# Patient Record
Sex: Male | Born: 1945 | Race: White | Hispanic: No | Marital: Married | Smoking: Former smoker
Health system: Southern US, Community
[De-identification: ages and names within clinical notes are randomized; demographics above are authoritative.]

## PROBLEM LIST (undated history)

## (undated) DIAGNOSIS — M109 Gout, unspecified: Secondary | ICD-10-CM

## (undated) DIAGNOSIS — I2510 Atherosclerotic heart disease of native coronary artery without angina pectoris: Secondary | ICD-10-CM

## (undated) DIAGNOSIS — M722 Plantar fascial fibromatosis: Secondary | ICD-10-CM

## (undated) DIAGNOSIS — M549 Dorsalgia, unspecified: Secondary | ICD-10-CM

## (undated) DIAGNOSIS — G709 Myoneural disorder, unspecified: Secondary | ICD-10-CM

## (undated) DIAGNOSIS — E119 Type 2 diabetes mellitus without complications: Secondary | ICD-10-CM

## (undated) DIAGNOSIS — R251 Tremor, unspecified: Secondary | ICD-10-CM

## (undated) DIAGNOSIS — E785 Hyperlipidemia, unspecified: Secondary | ICD-10-CM

## (undated) DIAGNOSIS — K5792 Diverticulitis of intestine, part unspecified, without perforation or abscess without bleeding: Secondary | ICD-10-CM

## (undated) DIAGNOSIS — I214 Non-ST elevation (NSTEMI) myocardial infarction: Secondary | ICD-10-CM

## (undated) DIAGNOSIS — M1990 Unspecified osteoarthritis, unspecified site: Secondary | ICD-10-CM

## (undated) HISTORY — DX: Non-ST elevation (NSTEMI) myocardial infarction: I21.4

## (undated) HISTORY — DX: Atherosclerotic heart disease of native coronary artery without angina pectoris: I25.10

## (undated) HISTORY — PX: TRANSURETHRAL RESECTION OF PROSTATE: SHX73

## (undated) HISTORY — PX: KNEE SURGERY: SHX244

---
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
## 2004-11-15 ENCOUNTER — Inpatient Hospital Stay (HOSPITAL_COMMUNITY): Admission: RE | Admit: 2004-11-15 | Discharge: 2004-11-16 | Payer: Self-pay | Admitting: Urology

## 2005-10-08 ENCOUNTER — Inpatient Hospital Stay (HOSPITAL_COMMUNITY): Admission: AC | Admit: 2005-10-08 | Discharge: 2005-10-09 | Payer: Self-pay

## 2012-01-08 ENCOUNTER — Emergency Department (HOSPITAL_COMMUNITY)
Admission: EM | Admit: 2012-01-08 | Discharge: 2012-01-08 | Disposition: A | Discharge status: Home / Self Care | Payer: 59 | Attending: Emergency Medicine | Admitting: Emergency Medicine

## 2012-01-08 ENCOUNTER — Emergency Department (HOSPITAL_COMMUNITY): Payer: 59

## 2012-01-08 ENCOUNTER — Encounter (HOSPITAL_COMMUNITY): Payer: Self-pay | Admitting: *Deleted

## 2012-01-08 DIAGNOSIS — F17200 Nicotine dependence, unspecified, uncomplicated: Secondary | ICD-10-CM | POA: Insufficient documentation

## 2012-01-08 DIAGNOSIS — Z79899 Other long term (current) drug therapy: Secondary | ICD-10-CM | POA: Insufficient documentation

## 2012-01-08 DIAGNOSIS — Z885 Allergy status to narcotic agent status: Secondary | ICD-10-CM | POA: Insufficient documentation

## 2012-01-08 DIAGNOSIS — M1710 Unilateral primary osteoarthritis, unspecified knee: Secondary | ICD-10-CM | POA: Insufficient documentation

## 2012-01-08 DIAGNOSIS — Y9269 Other specified industrial and construction area as the place of occurrence of the external cause: Secondary | ICD-10-CM | POA: Insufficient documentation

## 2012-01-08 DIAGNOSIS — X500XXA Overexertion from strenuous movement or load, initial encounter: Secondary | ICD-10-CM | POA: Insufficient documentation

## 2012-01-08 DIAGNOSIS — M25562 Pain in left knee: Secondary | ICD-10-CM

## 2012-01-08 DIAGNOSIS — M1990 Unspecified osteoarthritis, unspecified site: Secondary | ICD-10-CM

## 2012-01-08 MED ORDER — HYDROMORPHONE HCL 2 MG PO TABS: 2.0000 mg | ORAL_TABLET | ORAL | Status: AC | PRN

## 2012-01-08 MED ORDER — HYDROMORPHONE HCL PF 2 MG/ML IJ SOLN
2.0000 mg | Freq: Once | INTRAMUSCULAR | Status: AC
  Administered 2012-01-08: 2 mg via INTRAMUSCULAR
  Filled 2012-01-08: qty 1

## 2012-01-08 NOTE — ED Notes (Signed)
Pt reports hx of left knee pain, states while at work he twisted it wrong and now can not walk on it.

## 2012-01-08 NOTE — Progress Notes (Signed)
Orthopedic Tech Progress Note Patient Details:  Adrian [name] [birth year] [id number]  Ortho Devices Type of Ortho Device: Crutches Ortho Device/Splint Interventions: Application   Nikki Dom 01/08/2012, 4:07 PM

## 2012-01-08 NOTE — ED Notes (Signed)
Ortho tech called for crutches 

## 2012-01-08 NOTE — ED Provider Notes (Signed)
History  Scribed for Adrian Co, MD, the patient was seen in room STRE5/STRE5. This chart was scribed by Candelaria Stagers. The patient's care started at 2:56 PM   CSN: [id number]  Arrival date & time 01/08/12  1423   First MD Initiated Contact with Patient 01/08/12 1449      Chief Complaint  Patient presents with  . Knee Pain     The history is provided by the patient.   Adrian [name] is a 66 y.o. male who presents to the Emergency Department complaining of left knee pain after twisting the knee earlier today while at work opening a door.  He was not able to put weight on it after the incident.  Bending is not a problem.  Pt has h/o lateral meniscus repair and chronic pain.  He will have total knee replacement this fall.   History reviewed. No pertinent past medical history.  History reviewed. No pertinent past surgical history.  History reviewed. No pertinent family history.  History  Substance Use Topics  . Smoking status: Current Everyday Smoker  . Smokeless tobacco: Not on file  . Alcohol Use: Yes     occ      Review of Systems  All other systems reviewed and are negative.    Allergies  Codeine and Hydrocodone  Home Medications   Current Outpatient Rx  Name Route Sig Dispense Refill  . ADULT MULTIVITAMIN W/MINERALS CH Oral Take 1 tablet by mouth daily.    [name] HYDROMORPHONE HCL 2 MG PO TABS Oral Take 1 tablet (2 mg total) by mouth every 4 (four) hours as needed for pain. 30 tablet 0    BP 113/75  Pulse 72  Temp 97.7 F (36.5 C) (Oral)  Resp 18  SpO2 96%  Physical Exam  Nursing note and vitals reviewed. Constitutional: He is oriented to person, place, and time. He appears well-developed and well-nourished. No distress.  HENT:  Head: Normocephalic and atraumatic.  Eyes: EOM are normal.  Neck: Neck supple. No tracheal deviation present.  Cardiovascular: Normal rate.   Pulmonary/Chest: Effort normal. No respiratory distress.  Musculoskeletal:  Normal range of motion.       Good pulse in left foot.  No tenderness along the medial or lateral joint line.  Full ROM of left knee.    Neurological: He is alert and oriented to person, place, and time.  Skin: Skin is warm and dry.  Psychiatric: He has a normal mood and affect. His behavior is normal.    ED Course  Procedures  DIAGNOSTIC STUDIES: Oxygen Saturation is 96% on room air, normal by my interpretation.    COORDINATION OF CARE:     Labs Reviewed - No data to display Dg Knee Complete 4 Views Left  01/08/2012  *RADIOLOGY REPORT*  Clinical Data: Twisting knee after fall.  LEFT KNEE - COMPLETE 4+ VIEW  Comparison: None.  Findings: No acute bony or joint or abnormality is identified. Chondrocalcinosis and degenerative disease noted. No joint effusion.  IMPRESSION: No acute finding. Degenerative disease and chondrocalcinosis.  Original Report Authenticated By: Bernadene Bell. D'ALESSIO, M.D.     1. Left knee pain   2. Osteoarthritis       MDM  The patient feels much better.  Discharge home in good condition.  Crutches given to the patient.  No evidence of fracture.  I suspect this is acute exacerbation of his severe left knee arthritis.  The patient will followup with his orthopedist   I personally performed  the services described in this documentation, which was scribed in my presence. The recorded information has been reviewed and considered.         Adrian Co, MD 01/08/12 2020

## 2012-01-08 NOTE — Discharge Instructions (Signed)
Degenerative Arthritis  You have osteoarthritis. This is the wear and tear arthritis that comes with aging. It is also called degenerative arthritis. This is common in people past middle age. It is caused by stress on the joints. The large weight bearing joints of the lower extremities are most often affected. The knees, hips, back, neck, and hands can become painful, swollen, and stiff. This is the most common type of arthritis. It comes on with age, carrying too much weight, or from an injury.  Treatment includes resting the sore joint until the pain and swelling improve. Crutches or a walker may be needed for severe flares. Only take over-the-counter or prescription medicines for pain, discomfort, or fever as directed by your caregiver. Local heat therapy may improve motion. Cortisone shots into the joint are sometimes used to reduce pain and swelling during flares.  Osteoarthritis is usually not crippling and progresses slowly. There are things you can do to decrease pain:  · Avoid high impact activities.  · Exercise regularly.  · Low impact exercises such as walking, biking and swimming help to keep the muscles strong and keep normal joint function.  · Stretching helps to keep your range of motion.  · Lose weight if you are overweight. This reduces joint stress.  In severe cases when you have pain at rest or increasing disability, joint surgery may be helpful. See your caregiver for follow-up treatment as recommended.   SEEK IMMEDIATE MEDICAL CARE IF:   · You have severe joint pain.  · Marked swelling and redness in your joint develops.  · You develop a high fever.  Document Released: 07/14/2005 Document Revised: 07/03/2011 Document Reviewed: 12/14/2006  [employer]® Patient Information ©2012 [employer].

## 2013-03-17 ENCOUNTER — Other Ambulatory Visit: Payer: Self-pay | Admitting: *Deleted

## 2013-03-17 DIAGNOSIS — R011 Cardiac murmur, unspecified: Secondary | ICD-10-CM

## 2013-03-18 ENCOUNTER — Other Ambulatory Visit: Payer: Self-pay | Admitting: Cardiovascular Disease

## 2013-03-18 LAB — LIPID PANEL
Cholesterol: 145 mg/dL (ref 0–200)
HDL: 26 mg/dL — ABNORMAL LOW (ref >39)
LDL Cholesterol: 53 mg/dL (ref 0–99)
Triglycerides: 330 mg/dL — ABNORMAL HIGH (ref <150)
VLDL: 66 mg/dL — ABNORMAL HIGH (ref 0–40)

## 2013-03-18 LAB — COMPREHENSIVE METABOLIC PANEL
BUN: 14 mg/dL (ref 6–23)
Chloride: 105 mEq/L (ref 96–112)
Creat: 1.19 mg/dL (ref 0.50–1.35)
Potassium: 4.3 mEq/L (ref 3.5–5.3)
Sodium: 138 mEq/L (ref 135–145)

## 2013-03-21 ENCOUNTER — Ambulatory Visit (HOSPITAL_COMMUNITY)
Admission: RE | Admit: 2013-03-21 | Discharge: 2013-03-21 | Disposition: A | Discharge status: Home / Self Care | Payer: Medicare Other | Source: Ambulatory Visit | Attending: Cardiovascular Disease | Admitting: Cardiovascular Disease

## 2013-03-21 DIAGNOSIS — R011 Cardiac murmur, unspecified: Secondary | ICD-10-CM

## 2013-03-21 NOTE — Progress Notes (Signed)
2D Echo Performed 03/21/2013    Clearence Ped, RCS

## 2013-03-23 ENCOUNTER — Telehealth: Payer: Self-pay | Admitting: Cardiovascular Disease

## 2013-03-23 NOTE — Telephone Encounter (Signed)
Would like lab results from 03-18-13.

## 2013-03-23 NOTE — Telephone Encounter (Signed)
Message forwarded to J.C. Wildman, LPN.  

## 2013-04-18 ENCOUNTER — Ambulatory Visit: Payer: Worker's Compensation | Attending: Interventional Cardiology | Admitting: Physical Therapy

## 2013-04-18 DIAGNOSIS — R609 Edema, unspecified: Secondary | ICD-10-CM | POA: Insufficient documentation

## 2013-04-18 DIAGNOSIS — Z96659 Presence of unspecified artificial knee joint: Secondary | ICD-10-CM | POA: Insufficient documentation

## 2013-04-18 DIAGNOSIS — M25669 Stiffness of unspecified knee, not elsewhere classified: Secondary | ICD-10-CM | POA: Insufficient documentation

## 2013-04-18 DIAGNOSIS — IMO0001 Reserved for inherently not codable concepts without codable children: Secondary | ICD-10-CM | POA: Insufficient documentation

## 2013-04-18 DIAGNOSIS — M25569 Pain in unspecified knee: Secondary | ICD-10-CM | POA: Insufficient documentation

## 2013-04-21 ENCOUNTER — Encounter: Payer: Self-pay | Admitting: Cardiovascular Disease

## 2013-04-25 ENCOUNTER — Ambulatory Visit: Payer: Worker's Compensation | Admitting: Physical Therapy

## 2013-04-25 DIAGNOSIS — IMO0001 Reserved for inherently not codable concepts without codable children: Secondary | ICD-10-CM | POA: Diagnosis not present

## 2013-04-27 ENCOUNTER — Ambulatory Visit: Payer: Worker's Compensation | Attending: Cardiovascular Disease | Admitting: Physical Therapy

## 2013-04-27 DIAGNOSIS — R609 Edema, unspecified: Secondary | ICD-10-CM | POA: Insufficient documentation

## 2013-04-27 DIAGNOSIS — M25669 Stiffness of unspecified knee, not elsewhere classified: Secondary | ICD-10-CM | POA: Insufficient documentation

## 2013-04-27 DIAGNOSIS — M25569 Pain in unspecified knee: Secondary | ICD-10-CM | POA: Insufficient documentation

## 2013-04-27 DIAGNOSIS — IMO0001 Reserved for inherently not codable concepts without codable children: Secondary | ICD-10-CM | POA: Insufficient documentation

## 2013-04-27 DIAGNOSIS — Z96659 Presence of unspecified artificial knee joint: Secondary | ICD-10-CM | POA: Insufficient documentation

## 2013-04-29 ENCOUNTER — Ambulatory Visit: Payer: Worker's Compensation | Admitting: Physical Therapy

## 2013-05-02 ENCOUNTER — Ambulatory Visit: Payer: Worker's Compensation | Admitting: Physical Therapy

## 2013-05-04 ENCOUNTER — Ambulatory Visit: Payer: Worker's Compensation | Admitting: Physical Therapy

## 2013-05-06 ENCOUNTER — Ambulatory Visit: Payer: Worker's Compensation | Admitting: Physical Therapy

## 2013-05-09 ENCOUNTER — Ambulatory Visit: Payer: Worker's Compensation | Admitting: Physical Therapy

## 2013-05-11 ENCOUNTER — Ambulatory Visit: Payer: Worker's Compensation | Admitting: Physical Therapy

## 2013-05-13 ENCOUNTER — Ambulatory Visit: Payer: Worker's Compensation | Admitting: Physical Therapy

## 2013-05-16 ENCOUNTER — Ambulatory Visit: Payer: Worker's Compensation | Admitting: Physical Therapy

## 2013-05-19 ENCOUNTER — Ambulatory Visit: Payer: Worker's Compensation | Admitting: Physical Therapy

## 2013-05-23 ENCOUNTER — Ambulatory Visit: Payer: Worker's Compensation | Admitting: Physical Therapy

## 2013-05-25 ENCOUNTER — Ambulatory Visit: Payer: Worker's Compensation | Admitting: Physical Therapy

## 2013-05-27 ENCOUNTER — Ambulatory Visit: Payer: Worker's Compensation | Admitting: Physical Therapy

## 2013-05-30 ENCOUNTER — Ambulatory Visit: Payer: Worker's Compensation | Attending: Adult Health | Admitting: Physical Therapy

## 2013-05-30 DIAGNOSIS — IMO0001 Reserved for inherently not codable concepts without codable children: Secondary | ICD-10-CM | POA: Diagnosis present

## 2013-05-30 DIAGNOSIS — R609 Edema, unspecified: Secondary | ICD-10-CM | POA: Insufficient documentation

## 2013-05-30 DIAGNOSIS — M25569 Pain in unspecified knee: Secondary | ICD-10-CM | POA: Diagnosis not present

## 2013-05-30 DIAGNOSIS — M25669 Stiffness of unspecified knee, not elsewhere classified: Secondary | ICD-10-CM | POA: Diagnosis not present

## 2013-05-30 DIAGNOSIS — Z96659 Presence of unspecified artificial knee joint: Secondary | ICD-10-CM | POA: Diagnosis not present

## 2013-06-02 ENCOUNTER — Ambulatory Visit: Payer: Worker's Compensation | Admitting: Physical Therapy

## 2013-06-02 DIAGNOSIS — IMO0001 Reserved for inherently not codable concepts without codable children: Secondary | ICD-10-CM | POA: Diagnosis not present

## 2013-06-14 ENCOUNTER — Other Ambulatory Visit: Payer: Self-pay | Admitting: *Deleted

## 2013-06-14 MED ORDER — SIMVASTATIN 40 MG PO TABS: 40.0000 mg | ORAL_TABLET | Freq: Every day | ORAL | Status: DC

## 2013-07-28 HISTORY — PX: KNEE SURGERY: SHX244

## 2013-08-23 ENCOUNTER — Inpatient Hospital Stay (HOSPITAL_COMMUNITY)
Admission: EM | Admit: 2013-08-23 | Discharge: 2013-08-31 | DRG: 234 | Disposition: A | Discharge status: Home / Self Care | Payer: Non-veteran care | Attending: Thoracic Surgery (Cardiothoracic Vascular Surgery) | Admitting: Thoracic Surgery (Cardiothoracic Vascular Surgery)

## 2013-08-23 ENCOUNTER — Encounter (HOSPITAL_COMMUNITY): Payer: Self-pay | Admitting: Emergency Medicine

## 2013-08-23 ENCOUNTER — Emergency Department (HOSPITAL_COMMUNITY): Payer: Non-veteran care

## 2013-08-23 DIAGNOSIS — I25709 Atherosclerosis of coronary artery bypass graft(s), unspecified, with unspecified angina pectoris: Secondary | ICD-10-CM | POA: Diagnosis present

## 2013-08-23 DIAGNOSIS — Z951 Presence of aortocoronary bypass graft: Secondary | ICD-10-CM

## 2013-08-23 DIAGNOSIS — Q619 Cystic kidney disease, unspecified: Secondary | ICD-10-CM

## 2013-08-23 DIAGNOSIS — R0603 Acute respiratory distress: Secondary | ICD-10-CM

## 2013-08-23 DIAGNOSIS — I214 Non-ST elevation (NSTEMI) myocardial infarction: Principal | ICD-10-CM | POA: Diagnosis present

## 2013-08-23 DIAGNOSIS — D62 Acute posthemorrhagic anemia: Secondary | ICD-10-CM | POA: Diagnosis not present

## 2013-08-23 DIAGNOSIS — N200 Calculus of kidney: Secondary | ICD-10-CM | POA: Diagnosis present

## 2013-08-23 DIAGNOSIS — K209 Esophagitis, unspecified without bleeding: Secondary | ICD-10-CM | POA: Diagnosis present

## 2013-08-23 DIAGNOSIS — J984 Other disorders of lung: Secondary | ICD-10-CM | POA: Diagnosis not present

## 2013-08-23 DIAGNOSIS — N269 Renal sclerosis, unspecified: Secondary | ICD-10-CM | POA: Diagnosis present

## 2013-08-23 DIAGNOSIS — E785 Hyperlipidemia, unspecified: Secondary | ICD-10-CM | POA: Diagnosis present

## 2013-08-23 DIAGNOSIS — K5732 Diverticulitis of large intestine without perforation or abscess without bleeding: Secondary | ICD-10-CM | POA: Diagnosis present

## 2013-08-23 DIAGNOSIS — K449 Diaphragmatic hernia without obstruction or gangrene: Secondary | ICD-10-CM | POA: Diagnosis present

## 2013-08-23 DIAGNOSIS — R110 Nausea: Secondary | ICD-10-CM | POA: Diagnosis not present

## 2013-08-23 DIAGNOSIS — I2510 Atherosclerotic heart disease of native coronary artery without angina pectoris: Secondary | ICD-10-CM | POA: Diagnosis present

## 2013-08-23 DIAGNOSIS — K5792 Diverticulitis of intestine, part unspecified, without perforation or abscess without bleeding: Secondary | ICD-10-CM

## 2013-08-23 DIAGNOSIS — I498 Other specified cardiac arrhythmias: Secondary | ICD-10-CM | POA: Diagnosis not present

## 2013-08-23 DIAGNOSIS — I700 Atherosclerosis of aorta: Secondary | ICD-10-CM | POA: Diagnosis present

## 2013-08-23 DIAGNOSIS — F17200 Nicotine dependence, unspecified, uncomplicated: Secondary | ICD-10-CM | POA: Diagnosis present

## 2013-08-23 DIAGNOSIS — N3289 Other specified disorders of bladder: Secondary | ICD-10-CM | POA: Diagnosis present

## 2013-08-23 DIAGNOSIS — D696 Thrombocytopenia, unspecified: Secondary | ICD-10-CM | POA: Diagnosis not present

## 2013-08-23 HISTORY — DX: Hyperlipidemia, unspecified: E78.5

## 2013-08-23 HISTORY — DX: Diverticulitis of intestine, part unspecified, without perforation or abscess without bleeding: K57.92

## 2013-08-23 LAB — CBC
HEMATOCRIT: 47.6 % (ref 39.0–52.0)
HEMOGLOBIN: 17.3 g/dL — AB (ref 13.0–17.0)
MCH: 30.8 pg (ref 26.0–34.0)
MCHC: 36.3 g/dL — ABNORMAL HIGH (ref 30.0–36.0)
MCV: 84.8 fL (ref 78.0–100.0)
PLATELETS: 210 10*3/uL (ref 150–400)
RBC: 5.61 MIL/uL (ref 4.22–5.81)
RDW: 14.1 % (ref 11.5–15.5)
WBC: 15.3 10*3/uL — ABNORMAL HIGH (ref 4.0–10.5)

## 2013-08-23 LAB — POCT I-STAT TROPONIN I: TROPONIN I, POC: 0.01 ng/mL (ref 0.00–0.08)

## 2013-08-23 LAB — BASIC METABOLIC PANEL
BUN: 12 mg/dL (ref 6–23)
CO2: 18 mEq/L — ABNORMAL LOW (ref 19–32)
CREATININE: 1.33 mg/dL (ref 0.50–1.35)
Calcium: 8.8 mg/dL (ref 8.4–10.5)
Chloride: 98 mEq/L (ref 96–112)
GFR calc Af Amer: 63 mL/min — ABNORMAL LOW (ref >90)
GFR calc non Af Amer: 54 mL/min — ABNORMAL LOW (ref >90)
Glucose, Bld: 158 mg/dL — ABNORMAL HIGH (ref 70–99)
Potassium: 3.5 mEq/L — ABNORMAL LOW (ref 3.7–5.3)
Sodium: 138 mEq/L (ref 137–147)

## 2013-08-23 LAB — PRO B NATRIURETIC PEPTIDE: PRO B NATRI PEPTIDE: 27.4 pg/mL (ref 0–125)

## 2013-08-23 MED ORDER — IOHEXOL 300 MG/ML  SOLN
25.0000 mL | Freq: Once | INTRAMUSCULAR | Status: AC | PRN
  Administered 2013-08-23: 25 mL via ORAL

## 2013-08-23 MED ORDER — IOHEXOL 350 MG/ML SOLN
100.0000 mL | Freq: Once | INTRAVENOUS | Status: AC | PRN
  Administered 2013-08-23: 100 mL via INTRAVENOUS

## 2013-08-23 MED ORDER — SODIUM CHLORIDE 0.9 % IV BOLUS (SEPSIS)
1000.0000 mL | Freq: Once | INTRAVENOUS | Status: AC
  Administered 2013-08-23: 1000 mL via INTRAVENOUS

## 2013-08-23 NOTE — ED Notes (Signed)
Patient finished contrast. CT paged

## 2013-08-23 NOTE — ED Notes (Signed)
Sudden onset of shortness of breath approx 30 mins prior to arrival of EMS.  Patient also having abdominal pain, similar to diverticulitis pain.  Patient continues with shortness of breath, with O2 Sats in the low 90's per EMS.  Patient is CAOx3 with EMS.  Patient is pale, was initially diaphoretic and hypotensive with EMS.  Patient last pressure with EMS was 86/52.

## 2013-08-23 NOTE — ED Provider Notes (Signed)
CSN: [id number]     Arrival date & time 08/23/13  2100 History   First MD Initiated Contact with Patient 08/23/13 2110     Chief Complaint  Patient presents with  . Shortness of Breath   (Consider location/radiation/quality/duration/timing/severity/associated sxs/prior Treatment) HPI Comments: 68 yo male with hyperchol, knee replacement last year presents with acute sob 40 min PTA, pt at rest at the time, no hx of similar.  No known PE or MI/ CAD hx.  No cp.  BP hypotensive on route, improved.  LLQ pain for two days, PCP called him in abx as similar to diverticulitis hx.  No lung dz hx.  Patient denies blood clot history, active cancer, recent major trauma or surgery, unilateral leg swelling/ pain, recent long travel, hemoptysis or oral contraceptives.  Worse with lying flat. No recent exertional sxs or lung infections.    Patient is a 68 y.o. male presenting with shortness of breath. The history is provided by the patient.  Shortness of Breath Associated symptoms: abdominal pain (ache, intermittent)   Associated symptoms: no chest pain, no fever, no headaches, no neck pain, no rash and no vomiting     Past Medical History  Diagnosis Date  . Diverticulitis   . Hyperlipidemia    Past Surgical History  Procedure Laterality Date  . Knee surgery     No family history on file. History  Substance Use Topics  . Smoking status: Current Every Day Smoker  . Smokeless tobacco: Not on file  . Alcohol Use: Yes     Comment: occ    Review of Systems  Constitutional: Negative for fever and chills.  HENT: Negative for congestion.   Eyes: Negative for visual disturbance.  Respiratory: Positive for shortness of breath.   Cardiovascular: Negative for chest pain.  Gastrointestinal: Positive for abdominal pain (ache, intermittent). Negative for vomiting.  Genitourinary: Negative for dysuria and flank pain.  Musculoskeletal: Negative for back pain, neck pain and neck stiffness.  Skin: Negative  for rash.  Neurological: Negative for light-headedness and headaches.    Allergies  Codeine and Hydrocodone  Home Medications   Current Outpatient Rx  Name  Route  Sig  Dispense  Refill  . Multiple Vitamin (MULTIVITAMIN WITH MINERALS) TABS   Oral   Take 1 tablet by mouth daily.         . simvastatin (ZOCOR) 40 MG tablet   Oral   Take 1 tablet (40 mg total) by mouth at bedtime.   30 tablet   6    BP 111/66  Pulse 81  Temp(Src) 96.7 F (35.9 C) (Oral)  Resp 40  SpO2 100% Physical Exam  Nursing note and vitals reviewed. Constitutional: He is oriented to person, place, and time. He appears well-developed and well-nourished. He appears distressed.  HENT:  Head: Normocephalic and atraumatic.  Eyes: Conjunctivae are normal. Right eye exhibits no discharge. Left eye exhibits no discharge.  Neck: Normal range of motion. Neck supple. No tracheal deviation present.  Cardiovascular: Normal rate and regular rhythm.   Pulmonary/Chest: Breath sounds normal. He is in respiratory distress (tachypnea, mild retractions).  Abdominal: Soft. He exhibits no distension. There is tenderness (mild LLQ). There is no guarding.  Musculoskeletal: He exhibits no edema and no tenderness.  Neurological: He is alert and oriented to person, place, and time. No cranial nerve deficit.  Skin: Skin is warm. No rash noted. He is diaphoretic.  Psychiatric: He has a normal mood and affect.    ED Course  Procedures (  including critical care time) CRITICAL CARE Performed by: Mariea Clonts   Total critical care time: 35 min  Critical care time was exclusive of separately billable procedures and treating other patients.  Critical care was necessary to treat or prevent imminent or life-threatening deterioration.  Critical care was time spent personally by me on the following activities: development of treatment plan with patient and/or surrogate as well as nursing, discussions with consultants,  evaluation of patient's response to treatment, examination of patient, obtaining history from patient or surrogate, ordering and performing treatments and interventions, ordering and review of laboratory studies, ordering and review of radiographic studies, pulse oximetry and re-evaluation of patient's condition.  Emergency Ultrasound: Limited Thoracic Performed and interpreted by Dr Reather Converse Longitudinal view of anterior left and right lung fields in real-time with linear probe. Indication: acute dyspnea Findings: Positive lung sliding; no B lines Interpretation: No evidence of pneumothorax or CHF Images electronically archived.     EMERGENCY DEPARTMENT [place] CARDIAC EXAM "Study: Limited Ultrasound of the heart and pericardium"  INDICATIONS:Dyspnea Multiple views of the heart and pericardium were obtained in real-time with a multi-frequency probe.  PERFORMED IO:EVOJJK  IMAGES ARCHIVED?: Yes  FINDINGS: No pericardial effusion, Normal contractility, Tamponade physiology absent and No coordinated contractionis  LIMITATIONS:  Body habitus  VIEWS USED: Parasternal long axis and Apical 4 chamber   INTERPRETATION: Cardiac activity present, Pericardial effusioin absent and Cardiac tamponade absent Difficult exam due to respiratory distress     Labs Review Labs Reviewed  CBC - Abnormal; Notable for the following:    WBC 15.3 (*)    Hemoglobin 17.3 (*)    MCHC 36.3 (*)    All other components within normal limits  BASIC METABOLIC PANEL - Abnormal; Notable for the following:    Potassium 3.5 (*)    CO2 18 (*)    Glucose, Bld 158 (*)    GFR calc non Af Amer 54 (*)    GFR calc Af Amer 63 (*)    All other components within normal limits  TROPONIN I - Abnormal; Notable for the following:    Troponin I 0.97 (*)    All other components within normal limits  PRO B NATRIURETIC PEPTIDE  INFLUENZA PANEL BY PCR (TYPE A & B, H1N1)  URINALYSIS, ROUTINE W REFLEX MICROSCOPIC  POCT I-STAT  TROPONIN I   Imaging Review Ct Angio Chest Pe W/cm &/or Wo Cm  08/24/2013   CLINICAL DATA:  Shortness of breath  EXAM: CT ANGIOGRAPHY CHEST WITH CONTRAST  TECHNIQUE: Multidetector CT imaging of the chest was performed using the standard protocol during bolus administration of intravenous contrast. Multiplanar CT image reconstructions including MIPs were obtained to evaluate the vascular anatomy.  CONTRAST:  144mL OMNIPAQUE IOHEXOL 350 MG/ML SOLN  COMPARISON:  08/23/2013 radiograph  FINDINGS: The pulmonary arterial branch vessels are patent.  Normal caliber aorta with scattered atherosclerotic disease.  Upper normal to mildly enlarged heart size with left ventricular hypertrophy. Coronary artery and aortic valvular calcifications. Trace pericardial fluid. No pleural effusion. No intrathoracic lymphadenopathy.  Upper abdominal images show a cyst arising from the right kidney anteriorly.  Lobular attenuation along the right margin of the trachea. Central airways are otherwise patent. Mild linear lung base opacities and mild lower lobe peribronchial thickening. Mild apical scarring.  No acute osseous finding.  Review of the MIP images confirms the above findings.  IMPRESSION: No pulmonary embolism.  Lobular attenuation along the right margin of the trachea is favored to reflect mucous/secretions. Attention on  follow-up.  Mild lower lobe peribronchial thickening is nonspecific. Correlate clinically for mild bronchitis. Mild bibasilar opacities, favor atelectasis.  Heart size upper normal to mildly enlarged with left ventricular hypertrophy. Coronary artery calcifications.   Electronically Signed   By: Carlos Levering M.D.   On: 08/24/2013 00:34   Ct Abdomen Pelvis W Contrast  08/24/2013   CLINICAL DATA:  Abdominal pain  EXAM: CT ABDOMEN AND PELVIS WITH CONTRAST  TECHNIQUE: Multidetector CT imaging of the abdomen and pelvis was performed using the standard protocol following bolus administration of intravenous  contrast.  CONTRAST:  115mL OMNIPAQUE IOHEXOL 350 MG/ML SOLN  COMPARISON:  None.  FINDINGS: See separate chest CT report. Small hiatal hernia and mild distal esophageal wall thickening.  Decreased attenuation of the liver is nonspecific post contrast however can be seen in the setting of fatty infiltration. No radiodense gallstones or biliary ductal dilatation. No appreciable abnormality of the pancreas, adrenal glands, spleen.  Left renal atrophy and fullness of the left renal pelvis. No hydroureter. Multiple bilateral renal cysts. Nonobstructing stone on the right. No hydroureteronephrosis on the right.  Colonic diverticulosis. Inflamed diverticulum along the proximal sigmoid colon with adjacent ill-defined fluid, fat stranding, and fascial thickening. No loculated fluid collection. No free intraperitoneal air. No bowel obstruction. Appendix not identified. No right lower quadrant inflammation. Small duodenal diverticulum. No enlarged lymph nodes.  Moderately distended bladder with mild wall thickening. Small fat containing right inguinal hernia. Prostate gland measures 4.5 cm transverse diameter with questionable TURP defect. Partially imaged right hydrocele.  Scattered atherosclerotic disease of the aorta and branch vessels. Mild infrarenal ectasia up to 2.6 cm.  L5 pars defects. Grade 1 anterolisthesis of L5 and S1. L5-S1 degenerative disc disease.  IMPRESSION: Acute proximal sigmoid colon diverticulitis. No free intraperitoneal air or loculated fluid collection to suggest abscess.  Small hiatal hernia and mild distal esophageal wall thickening, may reflect esophagitis or GERD.  Suspect hepatic steatosis.  Left renal atrophy and bilateral renal cysts. Nonobstructing interpolar/ lower pole right renal stone. Left renal pelvis fullness is likely chronic.  Moderately distended bladder with mild wall thickening. May reflect chronic partial outlet obstruction. Correlate with urinalysis to exclude cystitis.    Electronically Signed   By: Carlos Levering M.D.   On: 08/24/2013 00:41   Dg Chest Port 1 View  08/23/2013   CLINICAL DATA:  Short of breath.  Negative for chest pain.  EXAM: PORTABLE CHEST - 1 VIEW  COMPARISON:  11/14/2004.  FINDINGS: Cardiopericardial silhouette within normal limits. Mediastinal contours normal. Trachea midline. No airspace disease or effusion. Monitoring leads project over the chest. Mild basilar atelectasis.  IMPRESSION: No active disease.   Electronically Signed   By: Dereck Ligas M.D.   On: 08/23/2013 21:54    EKG Interpretation    Date/Time:  Tuesday August 23 2013 21:08:27 EST Ventricular Rate:  81 PR Interval:  156 QRS Duration: 78 QT Interval:  406 QTC Calculation: 471 R Axis:   39 Text Interpretation:  Normal sinus rhythm Nonspecific ST abnormality Abnormal ECG poor baseline Confirmed by Masao Junker  MD, Marven Veley ([id number]) on 08/23/2013 9:33:28 PM            Date: 08/24/2013  Rate: 75  Rhythm: normal sinus rhythm  QRS Axis: normal  Intervals: normal  ST/T Wave abnormalities: nonspecific ST changes  Conduction Disutrbances:none  Narrative Interpretation:   Old EKG Reviewed: changes noted poor baseline on previous    MDM   1. Acute respiratory distress   2. Acute  diverticulitis   3. NSTEMI (non-ST elevated myocardial infarction)    Acute resp distress and hypotensive for EMS.   Concern for acute PE vs acute pulm edema vs nstemi.  No CP in ED.  ABd pain secondary sx, pt says similar to previous diverticular issues. Pt improved on NRB.  BIPAP at bedside however pt improved. Bedside US no pneumothorax or B lines, difficult cardiac exam due to distress however decent EF and no effusion.  CT PE and CT abdo ordered.  Pt improved significantly on multiple rechecks.   No CP in ED, dyspnea improved significantly, pt on  1 L O2.   Second troponin elevated, asa given.   Paged [facility] and cardiology.  Cardiology requested no heparin will see pt, [facility]  accepted for tele.   CT results reviewed, renal stone, acute diverticulitis.  The patients results and plan were reviewed and discussed.   Any x-rays performed were personally reviewed by myself.   Differential diagnosis were considered with the presenting HPI.   EKG: reviewed two  Admission/ observation were discussed with the admitting physician, patient and/or family and they are comfortable with the plan.      Mariea Clonts, MD 08/24/13 [phone]

## 2013-08-24 ENCOUNTER — Other Ambulatory Visit: Payer: Self-pay | Admitting: *Deleted

## 2013-08-24 ENCOUNTER — Encounter (HOSPITAL_COMMUNITY)
Admission: EM | Disposition: A | Discharge status: Home / Self Care | Payer: Non-veteran care | Source: Home / Self Care | Attending: Thoracic Surgery (Cardiothoracic Vascular Surgery)

## 2013-08-24 DIAGNOSIS — N200 Calculus of kidney: Secondary | ICD-10-CM | POA: Diagnosis present

## 2013-08-24 DIAGNOSIS — I25709 Atherosclerosis of coronary artery bypass graft(s), unspecified, with unspecified angina pectoris: Secondary | ICD-10-CM | POA: Diagnosis present

## 2013-08-24 DIAGNOSIS — J984 Other disorders of lung: Secondary | ICD-10-CM

## 2013-08-24 DIAGNOSIS — K5732 Diverticulitis of large intestine without perforation or abscess without bleeding: Secondary | ICD-10-CM | POA: Diagnosis present

## 2013-08-24 DIAGNOSIS — N269 Renal sclerosis, unspecified: Secondary | ICD-10-CM | POA: Diagnosis present

## 2013-08-24 DIAGNOSIS — K5792 Diverticulitis of intestine, part unspecified, without perforation or abscess without bleeding: Secondary | ICD-10-CM | POA: Diagnosis present

## 2013-08-24 DIAGNOSIS — Q619 Cystic kidney disease, unspecified: Secondary | ICD-10-CM | POA: Diagnosis not present

## 2013-08-24 DIAGNOSIS — I700 Atherosclerosis of aorta: Secondary | ICD-10-CM | POA: Diagnosis present

## 2013-08-24 DIAGNOSIS — R079 Chest pain, unspecified: Secondary | ICD-10-CM

## 2013-08-24 DIAGNOSIS — I498 Other specified cardiac arrhythmias: Secondary | ICD-10-CM | POA: Diagnosis not present

## 2013-08-24 DIAGNOSIS — K449 Diaphragmatic hernia without obstruction or gangrene: Secondary | ICD-10-CM | POA: Diagnosis present

## 2013-08-24 DIAGNOSIS — D696 Thrombocytopenia, unspecified: Secondary | ICD-10-CM | POA: Diagnosis not present

## 2013-08-24 DIAGNOSIS — F17200 Nicotine dependence, unspecified, uncomplicated: Secondary | ICD-10-CM | POA: Diagnosis present

## 2013-08-24 DIAGNOSIS — I214 Non-ST elevation (NSTEMI) myocardial infarction: Secondary | ICD-10-CM | POA: Diagnosis present

## 2013-08-24 DIAGNOSIS — K209 Esophagitis, unspecified without bleeding: Secondary | ICD-10-CM | POA: Diagnosis present

## 2013-08-24 DIAGNOSIS — D62 Acute posthemorrhagic anemia: Secondary | ICD-10-CM | POA: Diagnosis not present

## 2013-08-24 DIAGNOSIS — I2510 Atherosclerotic heart disease of native coronary artery without angina pectoris: Secondary | ICD-10-CM

## 2013-08-24 DIAGNOSIS — E785 Hyperlipidemia, unspecified: Secondary | ICD-10-CM | POA: Diagnosis present

## 2013-08-24 DIAGNOSIS — J9600 Acute respiratory failure, unspecified whether with hypoxia or hypercapnia: Secondary | ICD-10-CM

## 2013-08-24 DIAGNOSIS — R110 Nausea: Secondary | ICD-10-CM | POA: Diagnosis not present

## 2013-08-24 DIAGNOSIS — N3289 Other specified disorders of bladder: Secondary | ICD-10-CM | POA: Diagnosis present

## 2013-08-24 HISTORY — PX: LEFT HEART CATHETERIZATION WITH CORONARY ANGIOGRAM: SHX5451

## 2013-08-24 LAB — URINALYSIS, ROUTINE W REFLEX MICROSCOPIC
Bilirubin Urine: NEGATIVE
Glucose, UA: NEGATIVE mg/dL
Hgb urine dipstick: NEGATIVE
Ketones, ur: NEGATIVE mg/dL
Leukocytes, UA: NEGATIVE
Nitrite: NEGATIVE
PROTEIN: NEGATIVE mg/dL
SPECIFIC GRAVITY, URINE: 1.021 (ref 1.005–1.030)
UROBILINOGEN UA: 0.2 mg/dL (ref 0.0–1.0)
pH: 6 (ref 5.0–8.0)

## 2013-08-24 LAB — COMPREHENSIVE METABOLIC PANEL
ALT: 14 U/L (ref 0–53)
AST: 22 U/L (ref 0–37)
Albumin: 3.4 g/dL — ABNORMAL LOW (ref 3.5–5.2)
Alkaline Phosphatase: 60 U/L (ref 39–117)
BILIRUBIN TOTAL: 0.7 mg/dL (ref 0.3–1.2)
BUN: 11 mg/dL (ref 6–23)
CO2: 19 meq/L (ref 19–32)
Calcium: 8.4 mg/dL (ref 8.4–10.5)
Chloride: 106 mEq/L (ref 96–112)
Creatinine, Ser: 1.16 mg/dL (ref 0.50–1.35)
GFR calc Af Amer: 74 mL/min — ABNORMAL LOW (ref >90)
GFR, EST NON AFRICAN AMERICAN: 64 mL/min — AB (ref >90)
GLUCOSE: 103 mg/dL — AB (ref 70–99)
Potassium: 4.2 mEq/L (ref 3.7–5.3)
SODIUM: 140 meq/L (ref 137–147)
Total Protein: 6.5 g/dL (ref 6.0–8.3)

## 2013-08-24 LAB — CBC WITH DIFFERENTIAL/PLATELET
Basophils Absolute: 0 10*3/uL (ref 0.0–0.1)
Basophils Relative: 0 % (ref 0–1)
Eosinophils Absolute: 0.2 10*3/uL (ref 0.0–0.7)
Eosinophils Relative: 2 % (ref 0–5)
HCT: 39.9 % (ref 39.0–52.0)
HEMOGLOBIN: 14 g/dL (ref 13.0–17.0)
LYMPHS ABS: 2.8 10*3/uL (ref 0.7–4.0)
Lymphocytes Relative: 25 % (ref 12–46)
MCH: 29.5 pg (ref 26.0–34.0)
MCHC: 35.1 g/dL (ref 30.0–36.0)
MCV: 84 fL (ref 78.0–100.0)
MONOS PCT: 7 % (ref 3–12)
Monocytes Absolute: 0.8 10*3/uL (ref 0.1–1.0)
NEUTROS ABS: 7.4 10*3/uL (ref 1.7–7.7)
Neutrophils Relative %: 66 % (ref 43–77)
Platelets: 169 10*3/uL (ref 150–400)
RBC: 4.75 MIL/uL (ref 4.22–5.81)
RDW: 14.4 % (ref 11.5–15.5)
WBC: 11.2 10*3/uL — ABNORMAL HIGH (ref 4.0–10.5)

## 2013-08-24 LAB — MAGNESIUM: Magnesium: 2.1 mg/dL (ref 1.5–2.5)

## 2013-08-24 LAB — HEMOGLOBIN A1C
Hgb A1c MFr Bld: 6 % — ABNORMAL HIGH (ref <5.7)
Mean Plasma Glucose: 126 mg/dL — ABNORMAL HIGH (ref <117)

## 2013-08-24 LAB — LIPID PANEL
CHOL/HDL RATIO: 5 ratio
Cholesterol: 119 mg/dL (ref 0–200)
HDL: 24 mg/dL — ABNORMAL LOW (ref >39)
LDL Cholesterol: 69 mg/dL (ref 0–99)
Triglycerides: 130 mg/dL (ref <150)
VLDL: 26 mg/dL (ref 0–40)

## 2013-08-24 LAB — TROPONIN I
TROPONIN I: 0.97 ng/mL — AB (ref <0.30)
Troponin I: 1.19 ng/mL (ref <0.30)
Troponin I: 1.14 ng/mL (ref <0.30)

## 2013-08-24 LAB — LACTIC ACID, PLASMA: Lactic Acid, Venous: 2 mmol/L (ref 0.5–2.2)

## 2013-08-24 LAB — APTT: APTT: 177 s — AB (ref 24–37)

## 2013-08-24 LAB — TSH: TSH: 1.126 u[IU]/mL (ref 0.350–4.500)

## 2013-08-24 LAB — INFLUENZA PANEL BY PCR (TYPE A & B)
H1N1FLUPCR: NOT DETECTED
Influenza A By PCR: NEGATIVE
Influenza B By PCR: NEGATIVE

## 2013-08-24 LAB — PROTIME-INR
INR: 1.17 (ref 0.00–1.49)
Prothrombin Time: 14.7 seconds (ref 11.6–15.2)

## 2013-08-24 LAB — HEPARIN LEVEL (UNFRACTIONATED): Heparin Unfractionated: 0.18 IU/mL — ABNORMAL LOW (ref 0.30–0.70)

## 2013-08-24 LAB — MRSA PCR SCREENING: MRSA BY PCR: NEGATIVE

## 2013-08-24 SURGERY — LEFT HEART CATHETERIZATION WITH CORONARY ANGIOGRAM
Anesthesia: LOCAL

## 2013-08-24 MED ORDER — ASPIRIN EC 325 MG PO TBEC
325.0000 mg | DELAYED_RELEASE_TABLET | Freq: Every day | ORAL | Status: DC
  Administered 2013-08-25: 325 mg via ORAL
  Filled 2013-08-24 (×2): qty 1

## 2013-08-24 MED ORDER — SODIUM CHLORIDE 0.9 % IJ SOLN: 3.0000 mL | Freq: Two times a day (BID) | INTRAMUSCULAR | Status: DC

## 2013-08-24 MED ORDER — HEPARIN (PORCINE) IN NACL 100-0.45 UNIT/ML-% IJ SOLN
1500.0000 [IU]/h | INTRAMUSCULAR | Status: DC
  Administered 2013-08-24: 1150 [IU]/h via INTRAVENOUS
  Filled 2013-08-24 (×4): qty 250

## 2013-08-24 MED ORDER — NITROGLYCERIN 0.4 MG SL SUBL: 0.4000 mg | SUBLINGUAL_TABLET | SUBLINGUAL | Status: DC | PRN

## 2013-08-24 MED ORDER — SODIUM CHLORIDE 0.9 % IV SOLN: INTRAVENOUS | Status: DC

## 2013-08-24 MED ORDER — HEPARIN BOLUS VIA INFUSION
4000.0000 [IU] | Freq: Once | INTRAVENOUS | Status: AC
  Administered 2013-08-24: 4000 [IU] via INTRAVENOUS
  Filled 2013-08-24: qty 4000

## 2013-08-24 MED ORDER — FENTANYL CITRATE 0.05 MG/ML IJ SOLN
INTRAMUSCULAR | Status: AC
  Filled 2013-08-24: qty 2

## 2013-08-24 MED ORDER — NITROGLYCERIN IN D5W 200-5 MCG/ML-% IV SOLN
10.0000 ug/min | INTRAVENOUS | Status: DC
  Administered 2013-08-24: 10 ug/min via INTRAVENOUS
  Filled 2013-08-24: qty 250

## 2013-08-24 MED ORDER — HYDROMORPHONE HCL PF 1 MG/ML IJ SOLN
INTRAMUSCULAR | Status: AC
  Filled 2013-08-24: qty 1

## 2013-08-24 MED ORDER — PIPERACILLIN-TAZOBACTAM 3.375 G IVPB 30 MIN
3.3750 g | Freq: Once | INTRAVENOUS | Status: AC
  Administered 2013-08-24: 3.375 g via INTRAVENOUS
  Filled 2013-08-24: qty 50

## 2013-08-24 MED ORDER — HEPARIN (PORCINE) IN NACL 2-0.9 UNIT/ML-% IJ SOLN
INTRAMUSCULAR | Status: AC
  Filled 2013-08-24: qty 1000

## 2013-08-24 MED ORDER — HEPARIN (PORCINE) IN NACL 100-0.45 UNIT/ML-% IJ SOLN
1000.0000 [IU]/h | INTRAMUSCULAR | Status: DC
Start: 2013-08-24 — End: 2013-08-24
  Administered 2013-08-24: 1000 [IU]/h via INTRAVENOUS
  Filled 2013-08-24 (×2): qty 250

## 2013-08-24 MED ORDER — ONDANSETRON HCL 4 MG/2ML IJ SOLN: 4.0000 mg | Freq: Three times a day (TID) | INTRAMUSCULAR | Status: AC | PRN

## 2013-08-24 MED ORDER — ASPIRIN 81 MG PO CHEW
81.0000 mg | CHEWABLE_TABLET | ORAL | Status: AC
  Administered 2013-08-24: 81 mg via ORAL
  Filled 2013-08-24: qty 1

## 2013-08-24 MED ORDER — BIVALIRUDIN 250 MG IV SOLR
INTRAVENOUS | Status: AC
  Filled 2013-08-24: qty 250

## 2013-08-24 MED ORDER — VERAPAMIL HCL 2.5 MG/ML IV SOLN
INTRAVENOUS | Status: AC
  Filled 2013-08-24: qty 2

## 2013-08-24 MED ORDER — ONDANSETRON HCL 4 MG/2ML IJ SOLN: 4.0000 mg | Freq: Four times a day (QID) | INTRAMUSCULAR | Status: DC | PRN

## 2013-08-24 MED ORDER — LIDOCAINE HCL (PF) 1 % IJ SOLN
INTRAMUSCULAR | Status: AC
  Filled 2013-08-24: qty 30

## 2013-08-24 MED ORDER — ASPIRIN 81 MG PO CHEW
324.0000 mg | CHEWABLE_TABLET | Freq: Once | ORAL | Status: AC
Start: 2013-08-24 — End: 2013-08-24
  Administered 2013-08-24: 324 mg via ORAL
  Filled 2013-08-24: qty 4

## 2013-08-24 MED ORDER — HEPARIN (PORCINE) IN NACL 2-0.9 UNIT/ML-% IJ SOLN
INTRAMUSCULAR | Status: AC
  Filled 2013-08-24: qty 1500

## 2013-08-24 MED ORDER — SODIUM CHLORIDE 0.9 % IJ SOLN: 3.0000 mL | INTRAMUSCULAR | Status: DC | PRN

## 2013-08-24 MED ORDER — HYDROMORPHONE HCL PF 1 MG/ML IJ SOLN
1.0000 mg | Freq: Once | INTRAMUSCULAR | Status: AC
  Administered 2013-08-24: 1 mg via INTRAVENOUS
  Filled 2013-08-24: qty 1

## 2013-08-24 MED ORDER — SODIUM CHLORIDE 0.9 % IV SOLN: 250.0000 mL | INTRAVENOUS | Status: DC | PRN

## 2013-08-24 MED ORDER — ZOLPIDEM TARTRATE 5 MG PO TABS
2.5000 mg | ORAL_TABLET | Freq: Every evening | ORAL | Status: DC | PRN
  Administered 2013-08-24 – 2013-08-25 (×2): 2.5 mg via ORAL
  Filled 2013-08-24 (×2): qty 1

## 2013-08-24 MED ORDER — HYDROMORPHONE HCL PF 2 MG/ML IJ SOLN
0.5000 mg | INTRAMUSCULAR | Status: DC | PRN
  Administered 2013-08-24: 0.5 mg via INTRAVENOUS

## 2013-08-24 MED ORDER — SIMVASTATIN 40 MG PO TABS
40.0000 mg | ORAL_TABLET | Freq: Every day | ORAL | Status: DC
  Administered 2013-08-24 – 2013-08-30 (×6): 40 mg via ORAL
  Filled 2013-08-24 (×9): qty 1

## 2013-08-24 MED ORDER — ACETAMINOPHEN 325 MG PO TABS
650.0000 mg | ORAL_TABLET | ORAL | Status: DC | PRN
  Administered 2013-08-25: 650 mg via ORAL
  Filled 2013-08-24: qty 2

## 2013-08-24 MED ORDER — HEPARIN SODIUM (PORCINE) 1000 UNIT/ML IJ SOLN
INTRAMUSCULAR | Status: AC
  Filled 2013-08-24: qty 1

## 2013-08-24 MED ORDER — SODIUM CHLORIDE 0.9 % IV SOLN: 1.0000 mL/kg/h | INTRAVENOUS | Status: DC

## 2013-08-24 MED ORDER — HYDROMORPHONE HCL PF 1 MG/ML IJ SOLN
0.5000 mg | INTRAMUSCULAR | Status: AC | PRN
  Administered 2013-08-24: 0.5 mg via INTRAVENOUS
  Filled 2013-08-24: qty 1

## 2013-08-24 MED ORDER — NITROGLYCERIN 0.2 MG/ML ON CALL CATH LAB
INTRAVENOUS | Status: AC
  Filled 2013-08-24: qty 1

## 2013-08-24 MED ORDER — GABAPENTIN 300 MG PO CAPS
300.0000 mg | ORAL_CAPSULE | Freq: Three times a day (TID) | ORAL | Status: DC
  Administered 2013-08-24 – 2013-08-31 (×18): 300 mg via ORAL
  Filled 2013-08-24 (×25): qty 1

## 2013-08-24 MED ORDER — FLUTICASONE PROPIONATE 50 MCG/ACT NA SUSP
2.0000 | Freq: Every day | NASAL | Status: DC
  Administered 2013-08-27 – 2013-08-29 (×2): 2 via NASAL
  Filled 2013-08-24 (×3): qty 16

## 2013-08-24 MED ORDER — HYDROMORPHONE HCL PF 1 MG/ML IJ SOLN: 0.5000 mg | Freq: Once | INTRAMUSCULAR | Status: DC

## 2013-08-24 MED ORDER — DIAZEPAM 5 MG PO TABS
5.0000 mg | ORAL_TABLET | ORAL | Status: AC
  Administered 2013-08-24: 5 mg via ORAL
  Filled 2013-08-24: qty 1

## 2013-08-24 MED ORDER — METOPROLOL TARTRATE 12.5 MG HALF TABLET
12.5000 mg | ORAL_TABLET | Freq: Two times a day (BID) | ORAL | Status: DC
  Administered 2013-08-24 – 2013-08-25 (×4): 12.5 mg via ORAL
  Filled 2013-08-24 (×7): qty 1

## 2013-08-24 MED ORDER — HYDROCODONE-ACETAMINOPHEN 5-325 MG PO TABS
0.5000 | ORAL_TABLET | Freq: Four times a day (QID) | ORAL | Status: DC | PRN
  Administered 2013-08-24 – 2013-08-25 (×4): 1 via ORAL
  Filled 2013-08-24 (×4): qty 1

## 2013-08-24 MED ORDER — METRONIDAZOLE IN NACL 5-0.79 MG/ML-% IV SOLN: 500.0000 mg | Freq: Once | INTRAVENOUS | Status: DC

## 2013-08-24 MED ORDER — MIDAZOLAM HCL 2 MG/2ML IJ SOLN
INTRAMUSCULAR | Status: AC
  Filled 2013-08-24: qty 2

## 2013-08-24 MED ORDER — CIPROFLOXACIN IN D5W 400 MG/200ML IV SOLN: 400.0000 mg | Freq: Once | INTRAVENOUS | Status: DC

## 2013-08-24 MED ORDER — SODIUM CHLORIDE 0.9 % IV SOLN
3.0000 g | Freq: Four times a day (QID) | INTRAVENOUS | Status: DC
  Administered 2013-08-24 – 2013-08-30 (×22): 3 g via INTRAVENOUS
  Filled 2013-08-24 (×29): qty 3

## 2013-08-24 NOTE — CV Procedure (Signed)
     Left Heart Catheterization with Coronary Angiography  Report  Adrian [name]  68 y.o.  male [birth year]  Procedure Date: 08/24/2013  Referring Physician: Dola Argyle, M.D. Primary Cardiologist:: Same Primary Physician: Dr. Thea Silversmith  INDICATIONS: Non-ST elevation acute coronary syndrome  PROCEDURE: 1. Coronary angiography; 2. Left ventriculography; 3. Left heart catheterization  CONSENT:  The risks, benefits, and details of the procedure were explained in detail to the patient. Risks including death, stroke, heart attack, kidney injury, allergy, limb ischemia, bleeding and radiation injury were discussed.  The patient verbalized understanding and wanted to proceed.  Informed written consent was obtained.  PROCEDURE TECHNIQUE:  After Xylocaine anesthesia a 5 French sheath was placed in the right radial artery with a single anterior needle wall stick.  Coronary angiography was done using a 5 [place] JR 4 and JL 3.5 catheter.  Left ventriculography was done using the JR 4 catheter and hand injection.   No technical difficulties were encountered  MEDICATIONS: Fentanyl 75 mcg IV; Versed 1 mg IV  CONTRAST:  Total of 100 cc.  COMPLICATIONS:  None   HEMODYNAMICS:  Aortic pressure 101/60 mmHg; LV pressure 102/14 mmHg; LVEDP 22 mm mercury  ANGIOGRAPHIC DATA:   The left main coronary artery is widely patent but heavily calcified.  The left anterior descending artery is heavily calcified proximal to mid vessel. Segmental 95% stenosis that crosses a large first septal perforator. The mid vessel contains eccentric 40% narrowing with heavy calcification also noted. The LAD wraps around the left ventricular apex. 4 small diagonal branches arise from the LAD.[name]  The left circumflex artery is patent but contains a proximal eccentric 65-75% stenosis in the large second obtuse marginal. The first obtuse marginal is small.  The right coronary artery is a segmental mid right coronary disease  within the region of heavy calcification. The distal most portion of this segment contains 99% stenosis and possibly recanalized thrombus with contrast staining or  severe calcification surrounding the most severe stenosis in the segment. The distal vessel contains a PDA and 2 moderate sized left ventricular branches the are graftable. The large acute marginal branch arises from within the severe stenosis in the mid right coronary.[name]   LEFT VENTRICULOGRAM:  Left ventricular angiogram was done in the 30 RAO projection and revealed normal size and contractility. Estimated ejection fraction 60%.   IMPRESSIONS:  1. 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 There is moderately severe second obtuse marginal disease obstructing up to 75%. 2. Normal left ventricular systolic function   RECOMMENDATION:  Consider coronary artery bypass grafting. Consult called at 1:20 PM IV heparin will be resumed IV nitroglycerin will be started Interventional therapy could be performed however the patient will be left with several very long stents within heavily calcified segments with an ongoing risk of potential stent related.

## 2013-08-24 NOTE — Progress Notes (Signed)
ANTICOAGULATION CONSULT NOTE - Initial Consult  Pharmacy Consult for Heparin Indication: chest pain/ACS  Allergies  Allergen Reactions  . Codeine Anxiety  . Hydrocodone Anxiety    Currently taking hydrocodone (2.5-5mg ) and is okay.    Patient Measurements: Height: 5\' 11"  (180.3 cm) Weight: 191 lb 12.8 oz (87 kg) IBW/kg (Calculated) : 75.3  Vital Signs: Temp: 97.6 F (36.4 C) (01/28 0342) Temp src: Oral (01/28 0342) BP: 117/73 mmHg (01/28 0342) Pulse Rate: 65 (01/28 0342)  Labs:  Recent Labs  08/23/13 2121 08/24/13 0038 08/24/13 0235  HGB 17.3*  --   --   HCT 47.6  --   --   PLT 210  --   --   CREATININE 1.33  --   --   TROPONINI  --  0.97* 1.19*    Estimated Creatinine Clearance: 58.2 ml/min (by C-G formula based on Cr of 1.33).   Medical History: Past Medical History  Diagnosis Date  . Diverticulitis   . Hyperlipidemia     Medications:  Prescriptions prior to admission  Medication Sig Dispense Refill  . acetaminophen (TYLENOL) 325 MG tablet Take 650 mg by mouth every 6 (six) hours as needed for mild pain.      [name] aspirin 81 MG chewable tablet Chew 81 mg by mouth daily.      . fluticasone (FLONASE) 50 MCG/ACT nasal spray Place 2 sprays into both nostrils daily.      [name] gabapentin (NEURONTIN) 300 MG capsule Take 300 mg by mouth 3 (three) times daily.      [name] HYDROcodone-acetaminophen (NORCO/VICODIN) 5-325 MG per tablet Take 0.5-1 tablets by mouth every 6 (six) hours as needed for moderate pain or severe pain.      . simvastatin (ZOCOR) 40 MG tablet Take 1 tablet (40 mg total) by mouth at bedtime.  30 tablet  6  . zolpidem (AMBIEN) 5 MG tablet Take 2.5 mg by mouth at bedtime as needed for sleep.        Assessment: 68 yo male with SOB/elevated cardiac markers for heparin  Goal of Therapy:  Heparin level 0.3-0.7 units/ml Monitor platelets by anticoagulation protocol: Yes   Plan:  Heparin 4000 units IV bolus, then 1000 units/hr Check heparin level in 6  hours.  Quanesha Klimaszewski, Bronson Curb 08/24/2013,3:59 AM

## 2013-08-24 NOTE — H&P (View-Only) (Signed)
Patient ID: Adrian [name], male   DOB: 1946/06/01, 68 y.o.   MRN: [id number]    SUBJECTIVE: The patient was admitted during the night. He was seen with a full cardiology consultation. It appears that he has both acute diverticulitis and an acute coronary syndrome. When he first presented yesterday he did have slight troponin elevation. He had been my hope after reviewing the chart that we could watch him medically before proceeding with elective catheterization. However the patient has ongoing mild chest discomfort at this time. He had a sensation of impending doom yesterday. I'm quite concerned about his coronary status. We will plan to proceed with catheterization today.   Filed Vitals:   08/24/13 0200 08/24/13 0245 08/24/13 0342 08/24/13 0943  BP: 127/71 114/68 117/73 120/70  Pulse: 74 70 65 68  Temp:   97.6 F (36.4 C)   TempSrc:   Oral   Resp: 14 17 18    Height:   5\' 11"  (1.803 m)   Weight:   191 lb 12.8 oz (87 kg)   SpO2: 94% 94% 96%      Intake/Output Summary (Last 24 hours) at 08/24/13 1102 Last data filed at 08/24/13 0218  Gross per 24 hour  Intake   1000 ml  Output      0 ml  Net   1000 ml    LABS: Basic Metabolic Panel:  Recent Labs  08/23/13 2121 08/24/13 0447  NA 138 140  K 3.5* 4.2  CL 98 106  CO2 18* 19  GLUCOSE 158* 103*  BUN 12 11  CREATININE 1.33 1.16  CALCIUM 8.8 8.4  MG  --  2.1   Liver Function Tests:  Recent Labs  08/24/13 0447  AST 22  ALT 14  ALKPHOS 60  BILITOT 0.7  PROT 6.5  ALBUMIN 3.4*   No results found for this basename: LIPASE, AMYLASE,  in the last 72 hours CBC:  Recent Labs  08/23/13 2121 08/24/13 0447  WBC 15.3* 11.2*  NEUTROABS  --  7.4  HGB 17.3* 14.0  HCT 47.6 39.9  MCV 84.8 84.0  PLT 210 169   Cardiac Enzymes:  Recent Labs  08/24/13 0038 08/24/13 0235 08/24/13 0907  TROPONINI 0.97* 1.19* 1.14*   BNP: No components found with this basename: POCBNP,  D-Dimer: No results found for this basename:  DDIMER,  in the last 72 hours Hemoglobin A1C: No results found for this basename: HGBA1C,  in the last 72 hours Fasting Lipid Panel:  Recent Labs  08/24/13 0447  CHOL 119  HDL 24*  LDLCALC 69  TRIG 130  CHOLHDL 5.0   Thyroid Function Tests: No results found for this basename: TSH, T4TOTAL, FREET3, T3FREE, THYROIDAB,  in the last 72 hours  RADIOLOGY: Ct Angio Chest Pe W/cm &/or Wo Cm  08/24/2013   CLINICAL DATA:  Shortness of breath  EXAM: CT ANGIOGRAPHY CHEST WITH CONTRAST  TECHNIQUE: Multidetector CT imaging of the chest was performed using the standard protocol during bolus administration of intravenous contrast. Multiplanar CT image reconstructions including MIPs were obtained to evaluate the vascular anatomy.  CONTRAST:  126mL OMNIPAQUE IOHEXOL 350 MG/ML SOLN  COMPARISON:  08/23/2013 radiograph  FINDINGS: The pulmonary arterial branch vessels are patent.  Normal caliber aorta with scattered atherosclerotic disease.  Upper normal to mildly enlarged heart size with left ventricular hypertrophy. Coronary artery and aortic valvular calcifications. Trace pericardial fluid. No pleural effusion. No intrathoracic lymphadenopathy.  Upper abdominal images show a cyst arising from the right kidney anteriorly.  Lobular attenuation along the right margin of the trachea. Central airways are otherwise patent. Mild linear lung base opacities and mild lower lobe peribronchial thickening. Mild apical scarring.  No acute osseous finding.  Review of the MIP images confirms the above findings.  IMPRESSION: No pulmonary embolism.  Lobular attenuation along the right margin of the trachea is favored to reflect mucous/secretions. Attention on follow-up.  Mild lower lobe peribronchial thickening is nonspecific. Correlate clinically for mild bronchitis. Mild bibasilar opacities, favor atelectasis.  Heart size upper normal to mildly enlarged with left ventricular hypertrophy. Coronary artery calcifications.    Electronically Signed   By: Carlos Levering M.D.   On: 08/24/2013 00:34   Ct Abdomen Pelvis W Contrast  08/24/2013   CLINICAL DATA:  Abdominal pain  EXAM: CT ABDOMEN AND PELVIS WITH CONTRAST  TECHNIQUE: Multidetector CT imaging of the abdomen and pelvis was performed using the standard protocol following bolus administration of intravenous contrast.  CONTRAST:  156mL OMNIPAQUE IOHEXOL 350 MG/ML SOLN  COMPARISON:  None.  FINDINGS: See separate chest CT report. Small hiatal hernia and mild distal esophageal wall thickening.  Decreased attenuation of the liver is nonspecific post contrast however can be seen in the setting of fatty infiltration. No radiodense gallstones or biliary ductal dilatation. No appreciable abnormality of the pancreas, adrenal glands, spleen.  Left renal atrophy and fullness of the left renal pelvis. No hydroureter. Multiple bilateral renal cysts. Nonobstructing stone on the right. No hydroureteronephrosis on the right.  Colonic diverticulosis. Inflamed diverticulum along the proximal sigmoid colon with adjacent ill-defined fluid, fat stranding, and fascial thickening. No loculated fluid collection. No free intraperitoneal air. No bowel obstruction. Appendix not identified. No right lower quadrant inflammation. Small duodenal diverticulum. No enlarged lymph nodes.  Moderately distended bladder with mild wall thickening. Small fat containing right inguinal hernia. Prostate gland measures 4.5 cm transverse diameter with questionable TURP defect. Partially imaged right hydrocele.  Scattered atherosclerotic disease of the aorta and branch vessels. Mild infrarenal ectasia up to 2.6 cm.  L5 pars defects. Grade 1 anterolisthesis of L5 and S1. L5-S1 degenerative disc disease.  IMPRESSION: Acute proximal sigmoid colon diverticulitis. No free intraperitoneal air or loculated fluid collection to suggest abscess.  Small hiatal hernia and mild distal esophageal wall thickening, may reflect esophagitis or  GERD.  Suspect hepatic steatosis.  Left renal atrophy and bilateral renal cysts. Nonobstructing interpolar/ lower pole right renal stone. Left renal pelvis fullness is likely chronic.  Moderately distended bladder with mild wall thickening. May reflect chronic partial outlet obstruction. Correlate with urinalysis to exclude cystitis.   Electronically Signed   By: Carlos Levering M.D.   On: 08/24/2013 00:41   Dg Chest Port 1 View  08/23/2013   CLINICAL DATA:  Short of breath.  Negative for chest pain.  EXAM: PORTABLE CHEST - 1 VIEW  COMPARISON:  11/14/2004.  FINDINGS: Cardiopericardial silhouette within normal limits. Mediastinal contours normal. Trachea midline. No airspace disease or effusion. Monitoring leads project over the chest. Mild basilar atelectasis.  IMPRESSION: No active disease.   Electronically Signed   By: Dereck Ligas M.D.   On: 08/23/2013 21:54    PHYSICAL EXAM  patient is oriented to person time and place. Affect is normal. Lungs reveal scattered rhonchi. Cardiac exam reveals S1 and S2. There is no significant peripheral edema.   TELEMETRY: I have reviewed telemetry. August 16, 2013. There is normal sinus rhythm.   ASSESSMENT AND PLAN:    NSTEMI (non-ST elevated myocardial infarction)  In the first sentences at this note above, I outlined the fact that I am quite concerned about this patient's cardiac status. He does have diverticulitis. He has had this before. Despite the current discomfort. It is not as severe as prior episodes. There is no active GI bleeding. After very careful assessment I feel that we should proceed with cardiac catheterization. He is having ongoing chest discomfort at this time. The patient and family are in agreement. Plans are being made.    Diverticulitis      The patient is receiving antibiotics. He is also to be n.p.o. for this problem until the internal medicine team adjusted his diet.    Abnormal finding on abdominal CT including left renal  atrophy, bilateral renal cysts, nonobstructing lower pole right renal stone, moderately distended bladder.      Any further workup of this issue will be by internal medicine.  As part of today's evaluation I spent greater than 30 minutes critical care time with the overall assessment including seeing the patient thought that the family in making arrangements for the Cath Lab to proceed on a semiurgent basis.   Dola Argyle 08/24/2013 11:02 AM

## 2013-08-24 NOTE — Consult Note (Signed)
Reason for Consult:3 vesssel CAD post NQWMI Referring Physician: Dr. Sandria Bales is an 68 y.o. male.  HPI: 68 yo man with no prior history of CAD. He was in his usual state of health until the day prior to admission. He developed left lower quadrant pain. He has a history of diverticulitis and recognized the symptoms. He called his MD and a prescription for an antibiotic was called into a pharmacy. When he went to get the antibiotic he became acutely ill and short of breath. He did not have any significant chest pain, pressure or tightness, but a tremendous sense of impending doom. He had generalized weakness.   EMS was called. He was tachypneic and tachycardic. He was brought to the ED. A CT showed acute diverticulitis if the sigmoid colon, but no evidence of PE. He had positive enzymes.  He was admitted and started on antibiotics. Today he had cardiac catheterization. 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  He currently denies CP or SOB, but does have some mild LLQ discomfort.  Past Medical History  Diagnosis Date  . Diverticulitis   . Hyperlipidemia     Past Surgical History  Procedure Laterality Date  . Knee surgery      No family history on file.  Social History:  reports that he has been smoking.  He does not have any smokeless tobacco history on file. He reports that he drinks alcohol. He reports that he does not use illicit drugs. ? Allergies:  Allergies  Allergen Reactions  . Codeine Anxiety  . Hydrocodone Anxiety    Currently taking hydrocodone (2.5-16m) and is okay.    Medications:  Prior to Admission:  Prescriptions prior to admission  Medication Sig Dispense Refill  . acetaminophen (TYLENOL) 325 MG tablet Take 650 mg by mouth every 6 (six) hours as needed for mild pain.      .Marland Kitchenaspirin 81 MG chewable tablet Chew 81 mg by mouth daily.      . fluticasone (FLONASE) 50 MCG/ACT nasal spray Place 2 sprays into both nostrils daily.      .[name] gabapentin (NEURONTIN) 300 MG capsule Take 300 mg by mouth 3 (three) times daily.      .Marland KitchenHYDROcodone-acetaminophen (NORCO/VICODIN) 5-325 MG per tablet Take 0.5-1 tablets by mouth every 6 (six) hours as needed for moderate pain or severe pain.      . simvastatin (ZOCOR) 40 MG tablet Take 1 tablet (40 mg total) by mouth at bedtime.  30 tablet  6  . zolpidem (AMBIEN) 5 MG tablet Take 2.5 mg by mouth at bedtime as needed for sleep.        Results for orders placed during the hospital encounter of 08/23/13 (from the past 48 hour(s))  CBC     Status: Abnormal   Collection Time    08/23/13  9:21 PM      Result Value Range   WBC 15.3 (*) 4.0 - 10.5 K/uL   RBC 5.61  4.22 - 5.81 MIL/uL   Hemoglobin 17.3 (*) 13.0 - 17.0 g/dL   HCT 47.6  39.0 - 52.0 %   MCV 84.8  78.0 - 100.0 fL   MCH 30.8  26.0 - 34.0 pg   MCHC 36.3 (*) 30.0 - 36.0 g/dL   RDW 14.1  11.5 - 15.5 %   Platelets 210  150 - 400 K/uL  BASIC METABOLIC PANEL     Status: Abnormal   Collection Time  08/23/13  9:21 PM      Result Value Range   Sodium 138  137 - 147 mEq/L   Potassium 3.5 (*) 3.7 - 5.3 mEq/L   Chloride 98  96 - 112 mEq/L   CO2 18 (*) 19 - 32 mEq/L   Glucose, Bld 158 (*) 70 - 99 mg/dL   BUN 12  6 - 23 mg/dL   Creatinine, Ser 1.33  0.50 - 1.35 mg/dL   Calcium 8.8  8.4 - 10.5 mg/dL   GFR calc non Af Amer 54 (*) >90 mL/min   GFR calc Af Amer 63 (*) >90 mL/min   Comment: (NOTE)     The eGFR has been calculated using the CKD EPI equation.     This calculation has not been validated in all clinical situations.     eGFR's persistently <90 mL/min signify possible Chronic Kidney     Disease.  PRO B NATRIURETIC PEPTIDE     Status: None   Collection Time    08/23/13  9:21 PM      Result Value Range   Pro B Natriuretic peptide (BNP) 27.4  0 - 125 pg/mL  POCT I-STAT TROPONIN I     Status: None   Collection Time    08/23/13  9:27 PM      Result Value Range   Troponin i, poc 0.01  0.00 - 0.08 ng/mL   Comment 3             Comment: Due to the release kinetics of cTnI,     a negative result within the first hours     of the onset of symptoms does not rule out     myocardial infarction with certainty.     If myocardial infarction is still suspected,     repeat the test at appropriate intervals.  TROPONIN I     Status: Abnormal   Collection Time    08/24/13 12:38 AM      Result Value Range   Troponin I 0.97 (*) <0.30 ng/mL   Comment:            Due to the release kinetics of cTnI,     a negative result within the first hours     of the onset of symptoms does not rule out     myocardial infarction with certainty.     If myocardial infarction is still suspected,     repeat the test at appropriate intervals.     CRITICAL RESULT CALLED TO, READ BACK BY AND VERIFIED WITH:     CAMP, J. RN [phone] [id number] EBANKS COLCLOUGH, S  LACTIC ACID, PLASMA     Status: None   Collection Time    08/24/13  2:22 AM      Result Value Range   Lactic Acid, Venous 2.0  0.5 - 2.2 mmol/L  TROPONIN I     Status: Abnormal   Collection Time    08/24/13  2:35 AM      Result Value Range   Troponin I 1.19 (*) <0.30 ng/mL   Comment:            Due to the release kinetics of cTnI,     a negative result within the first hours     of the onset of symptoms does not rule out     myocardial infarction with certainty.     If myocardial infarction is still suspected,     repeat the test at appropriate intervals.  CRITICAL VALUE NOTED.  VALUE IS CONSISTENT WITH PREVIOUSLY REPORTED AND CALLED VALUE.  INFLUENZA PANEL BY PCR (TYPE A & B, H1N1)     Status: None   Collection Time    08/24/13  3:13 AM      Result Value Range   Influenza A By PCR NEGATIVE  NEGATIVE   Influenza B By PCR NEGATIVE  NEGATIVE   H1N1 flu by pcr NOT DETECTED  NOT DETECTED   Comment:            The Xpert Flu assay (FDA approved for     nasal aspirates or washes and     nasopharyngeal swab specimens), is     intended as an aid in the diagnosis of     influenza and  should not be used as     a sole basis for treatment.  URINALYSIS, ROUTINE W REFLEX MICROSCOPIC     Status: None   Collection Time    08/24/13  3:34 AM      Result Value Range   Color, Urine YELLOW  YELLOW   APPearance CLEAR  CLEAR   Specific Gravity, Urine 1.021  1.005 - 1.030   pH 6.0  5.0 - 8.0   Glucose, UA NEGATIVE  NEGATIVE mg/dL   Hgb urine dipstick NEGATIVE  NEGATIVE   Bilirubin Urine NEGATIVE  NEGATIVE   Ketones, ur NEGATIVE  NEGATIVE mg/dL   Protein, ur NEGATIVE  NEGATIVE mg/dL   Urobilinogen, UA 0.2  0.0 - 1.0 mg/dL   Nitrite NEGATIVE  NEGATIVE   Leukocytes, UA NEGATIVE  NEGATIVE   Comment: MICROSCOPIC NOT DONE ON URINES WITH NEGATIVE PROTEIN, BLOOD, LEUKOCYTES, NITRITE, OR GLUCOSE <1000 mg/dL.  PROTIME-INR     Status: None   Collection Time    08/24/13  4:47 AM      Result Value Range   Prothrombin Time 14.7  11.6 - 15.2 seconds   INR 1.17  0.00 - 1.49  APTT     Status: Abnormal   Collection Time    08/24/13  4:47 AM      Result Value Range   aPTT 177 (*) 24 - 37 seconds   Comment:            IF BASELINE aPTT IS ELEVATED,     SUGGEST PATIENT RISK ASSESSMENT     BE USED TO DETERMINE APPROPRIATE     ANTICOAGULANT THERAPY.  CBC WITH DIFFERENTIAL     Status: Abnormal   Collection Time    08/24/13  4:47 AM      Result Value Range   WBC 11.2 (*) 4.0 - 10.5 K/uL   RBC 4.75  4.22 - 5.81 MIL/uL   Hemoglobin 14.0  13.0 - 17.0 g/dL   Comment: REPEATED TO VERIFY   HCT 39.9  39.0 - 52.0 %   MCV 84.0  78.0 - 100.0 fL   MCH 29.5  26.0 - 34.0 pg   MCHC 35.1  30.0 - 36.0 g/dL   RDW 14.4  11.5 - 15.5 %   Platelets 169  150 - 400 K/uL   Neutrophils Relative % 66  43 - 77 %   Neutro Abs 7.4  1.7 - 7.7 K/uL   Lymphocytes Relative 25  12 - 46 %   Lymphs Abs 2.8  0.7 - 4.0 K/uL   Monocytes Relative 7  3 - 12 %   Monocytes Absolute 0.8  0.1 - 1.0 K/uL   Eosinophils Relative 2  0 - 5 %  Eosinophils Absolute 0.2  0.0 - 0.7 K/uL   Basophils Relative 0  0 - 1 %   Basophils  Absolute 0.0  0.0 - 0.1 K/uL  TSH     Status: None   Collection Time    08/24/13  4:47 AM      Result Value Range   TSH 1.126  0.350 - 4.500 uIU/mL   Comment: Performed at [facility]     Status: Abnormal   Collection Time    08/24/13  4:47 AM      Result Value Range   Sodium 140  137 - 147 mEq/L   Potassium 4.2  3.7 - 5.3 mEq/L   Chloride 106  96 - 112 mEq/L   CO2 19  19 - 32 mEq/L   Glucose, Bld 103 (*) 70 - 99 mg/dL   BUN 11  6 - 23 mg/dL   Creatinine, Ser 1.16  0.50 - 1.35 mg/dL   Calcium 8.4  8.4 - 10.5 mg/dL   Total Protein 6.5  6.0 - 8.3 g/dL   Albumin 3.4 (*) 3.5 - 5.2 g/dL   AST 22  0 - 37 U/L   ALT 14  0 - 53 U/L   Alkaline Phosphatase 60  39 - 117 U/L   Total Bilirubin 0.7  0.3 - 1.2 mg/dL   GFR calc non Af Amer 64 (*) >90 mL/min   GFR calc Af Amer 74 (*) >90 mL/min   Comment: (NOTE)     The eGFR has been calculated using the CKD EPI equation.     This calculation has not been validated in all clinical situations.     eGFR's persistently <90 mL/min signify possible Chronic Kidney     Disease.  MAGNESIUM     Status: None   Collection Time    08/24/13  4:47 AM      Result Value Range   Magnesium 2.1  1.5 - 2.5 mg/dL  HEMOGLOBIN A1C     Status: Abnormal   Collection Time    08/24/13  4:47 AM      Result Value Range   Hemoglobin A1C 6.0 (*) <5.7 %   Comment: (NOTE)                                                                               According to the ADA Clinical Practice Recommendations for 2011, when     HbA1c is used as a screening test:      >=6.5%   Diagnostic of Diabetes Mellitus               (if abnormal result is confirmed)     5.7-6.4%   Increased risk of developing Diabetes Mellitus     References:Diagnosis and Classification of Diabetes Mellitus,Diabetes     ZOXW,9604,54(UJWJX 1):S62-S69 and Standards of Medical Care in             Diabetes - 2011,Diabetes Care,2011,34 (Suppl 1):S11-S61.   Mean  Plasma Glucose 126 (*) <117 mg/dL   Comment: Performed at [facility]: Abnormal   Collection Time    08/24/13  4:47 AM  Result Value Range   Cholesterol 119  0 - 200 mg/dL   Triglycerides 130  <150 mg/dL   HDL 24 (*) >39 mg/dL   Total CHOL/HDL Ratio 5.0     VLDL 26  0 - 40 mg/dL   LDL Cholesterol 69  0 - 99 mg/dL   Comment:            Total Cholesterol/HDL:CHD Risk     Coronary Heart Disease Risk Table                         Men   Women      1/2 Average Risk   3.4   3.3      Average Risk       5.0   4.4      2 X Average Risk   9.6   7.1      3 X Average Risk  23.4   11.0                Use the calculated Patient Ratio     above and the CHD Risk Table     to determine the patient's CHD Risk.                ATP III CLASSIFICATION (LDL):      <100     mg/dL   Optimal      100-129  mg/dL   Near or Above                        Optimal      130-159  mg/dL   Borderline      160-189  mg/dL   High      >190     mg/dL   Very High  TROPONIN I     Status: Abnormal   Collection Time    08/24/13  9:07 AM      Result Value Range   Troponin I 1.14 (*) <0.30 ng/mL   Comment:            Due to the release kinetics of cTnI,     a negative result within the first hours     of the onset of symptoms does not rule out     myocardial infarction with certainty.     If myocardial infarction is still suspected,     repeat the test at appropriate intervals.     CRITICAL VALUE NOTED.  VALUE IS CONSISTENT WITH PREVIOUSLY REPORTED AND CALLED VALUE.  HEPARIN LEVEL (UNFRACTIONATED)     Status: Abnormal   Collection Time    08/24/13 11:30 AM      Result Value Range   Heparin Unfractionated 0.18 (*) 0.30 - 0.70 IU/mL   Comment:            IF HEPARIN RESULTS ARE BELOW     EXPECTED VALUES, AND PATIENT     DOSAGE HAS BEEN CONFIRMED,     SUGGEST FOLLOW UP TESTING     OF ANTITHROMBIN III LEVELS.    Ct Angio Chest Pe W/cm &/or Wo Cm  08/24/2013   CLINICAL DATA:   Shortness of breath  EXAM: CT ANGIOGRAPHY CHEST WITH CONTRAST  TECHNIQUE: Multidetector CT imaging of the chest was performed using the standard protocol during bolus administration of intravenous contrast. Multiplanar CT image reconstructions including MIPs were obtained to evaluate the vascular anatomy.  CONTRAST:  148m OMNIPAQUE IOHEXOL 350 MG/ML SOLN  COMPARISON:  08/23/2013 radiograph  FINDINGS: The pulmonary arterial branch vessels are patent.  Normal caliber aorta with scattered atherosclerotic disease.  Upper normal to mildly enlarged heart size with left ventricular hypertrophy. Coronary artery and aortic valvular calcifications. Trace pericardial fluid. No pleural effusion. No intrathoracic lymphadenopathy.  Upper abdominal images show a cyst arising from the right kidney anteriorly.  Lobular attenuation along the right margin of the trachea. Central airways are otherwise patent. Mild linear lung base opacities and mild lower lobe peribronchial thickening. Mild apical scarring.  No acute osseous finding.  Review of the MIP images confirms the above findings.  IMPRESSION: No pulmonary embolism.  Lobular attenuation along the right margin of the trachea is favored to reflect mucous/secretions. Attention on follow-up.  Mild lower lobe peribronchial thickening is nonspecific. Correlate clinically for mild bronchitis. Mild bibasilar opacities, favor atelectasis.  Heart size upper normal to mildly enlarged with left ventricular hypertrophy. Coronary artery calcifications.   Electronically Signed   By: Carlos Levering M.D.   On: 08/24/2013 00:34   Ct Abdomen Pelvis W Contrast  08/24/2013   CLINICAL DATA:  Abdominal pain  EXAM: CT ABDOMEN AND PELVIS WITH CONTRAST  TECHNIQUE: Multidetector CT imaging of the abdomen and pelvis was performed using the standard protocol following bolus administration of intravenous contrast.  CONTRAST:  136m OMNIPAQUE IOHEXOL 350 MG/ML SOLN  COMPARISON:  None.  FINDINGS: See  separate chest CT report. Small hiatal hernia and mild distal esophageal wall thickening.  Decreased attenuation of the liver is nonspecific post contrast however can be seen in the setting of fatty infiltration. No radiodense gallstones or biliary ductal dilatation. No appreciable abnormality of the pancreas, adrenal glands, spleen.  Left renal atrophy and fullness of the left renal pelvis. No hydroureter. Multiple bilateral renal cysts. Nonobstructing stone on the right. No hydroureteronephrosis on the right.  Colonic diverticulosis. Inflamed diverticulum along the proximal sigmoid colon with adjacent ill-defined fluid, fat stranding, and fascial thickening. No loculated fluid collection. No free intraperitoneal air. No bowel obstruction. Appendix not identified. No right lower quadrant inflammation. Small duodenal diverticulum. No enlarged lymph nodes.  Moderately distended bladder with mild wall thickening. Small fat containing right inguinal hernia. Prostate gland measures 4.5 cm transverse diameter with questionable TURP defect. Partially imaged right hydrocele.  Scattered atherosclerotic disease of the aorta and branch vessels. Mild infrarenal ectasia up to 2.6 cm.  L5 pars defects. Grade 1 anterolisthesis of L5 and S1. L5-S1 degenerative disc disease.  IMPRESSION: Acute proximal sigmoid colon diverticulitis. No free intraperitoneal air or loculated fluid collection to suggest abscess.  Small hiatal hernia and mild distal esophageal wall thickening, may reflect esophagitis or GERD.  Suspect hepatic steatosis.  Left renal atrophy and bilateral renal cysts. Nonobstructing interpolar/ lower pole right renal stone. Left renal pelvis fullness is likely chronic.  Moderately distended bladder with mild wall thickening. May reflect chronic partial outlet obstruction. Correlate with urinalysis to exclude cystitis.   Electronically Signed   By: ACarlos LeveringM.D.   On: 08/24/2013 00:41   Dg Chest Port 1  View  08/23/2013   CLINICAL DATA:  Short of breath.  Negative for chest pain.  EXAM: PORTABLE CHEST - 1 VIEW  COMPARISON:  11/14/2004.  FINDINGS: Cardiopericardial silhouette within normal limits. Mediastinal contours normal. Trachea midline. No airspace disease or effusion. Monitoring leads project over the chest. Mild basilar atelectasis.  IMPRESSION: No active disease.   Electronically Signed   By: GDereck LigasM.D.   On: 08/23/2013 21:54  Review of Systems  Constitutional: Negative for fever and chills.  Respiratory: Positive for shortness of breath.   Cardiovascular: Negative for chest pain.  Gastrointestinal: Positive for abdominal pain and constipation.  Musculoskeletal: Positive for joint pain.  Neurological: Positive for weakness.  All other systems reviewed and are negative.   Blood pressure 109/59, pulse 54, temperature 98.1 F (36.7 C), temperature source Oral, resp. rate 17, height '5\' 11"'  (1.803 m), weight 191 lb 12.8 oz (87 kg), SpO2 94.00%. Physical Exam  Vitals reviewed. Constitutional: He is oriented to person, place, and time. He appears well-developed and well-nourished. No distress.  HENT:  Head: Normocephalic and atraumatic.  Eyes: EOM are normal. Pupils are equal, round, and reactive to light.  Neck: Neck supple. No thyromegaly present.  No carotid bruits  Cardiovascular: Normal rate, regular rhythm, normal heart sounds and intact distal pulses.   No murmur heard. Respiratory: Effort normal. He has no wheezes. He has no rales.  GI: Soft. There is tenderness (LLQ tenderness, no rebound).  Musculoskeletal: He exhibits no edema.  Lymphadenopathy:    He has no cervical adenopathy.  Neurological: He is alert and oriented to person, place, and time. No cranial nerve deficit.  No focal deficits  Skin: Skin is warm and dry.      CARDIAC CATHETERIZATION IMPRESSIONS: 1. Severe multivessel coronary disease involving the LAD (proximal 99%) and right coronary (mid  99%) most severely. There is moderately severe second obtuse marginal disease obstructing up to 75%.  2. Normal left ventricular systolic function  Assessment/Plan: 68 yo man with severe vessel CAD and a NQWMI in the setting of acute diverticulitis.  This is a difficult situation. CABG is the best option for revascularization, but his risk is markedly elevated due to the diverticulitis. The best case scenario would be if the diverticulitis "ccols off" over the next 24 to 48 hours with IV antibiotics. If so, we could potentially proceed with CABG on Friday. If the diverticulitis does not improve rapidly may need to consider alternative revascularization.  I am going to put him on the schedule for Friday, but will reevaluate him tomorrow before making a final decision.  Nour Scalise C 08/24/2013, 8:45 PM

## 2013-08-24 NOTE — H&P (Signed)
[facility] History and Physical  Patient: Adrian [name]  ERX:[id number]  DOB: 30-Aug-1945  DOS: the patient was seen and examined on 08/24/2013 PCP: [name], MD  Chief Complaint: abdominal pain and shortness of breath  HPI: Adrian [name] is a 68 y.o. male with Past medical history of recurrent diverticulitis. The patient is coming from home. The patient presented with complaints of abdominal pain that has been ongoing since yesterday. This was associated with episode of nausea and diarrhea without any blood. He called his PCP since he has history of recurrent diverticulitis and he was placed on some antibiotic. He denies any sick contact or outside food. When he was going to get his medication from the medical store he started having complaints of shortness of breath and severe generalized weakness. He was tachypneic and tachycardic and called EMS who placed him on nonrebreather and aberrantly as per patient he was saturating 92% on nonrebreather and brought him to the hospital. In the hospital his shortness of breath eventually improved. There is a questionable episode of hypotension with tachycardia. The patient denied any complaint of chest pain shortness of breath at the time of my evaluation. He denied any exertional dyspnea prior to this or any significant cardiac workup.   Review of Systems: as mentioned in the history of present illness.  A Comprehensive review of the other systems is negative.  Past Medical History  Diagnosis Date  . Diverticulitis   . Hyperlipidemia    Past Surgical History  Procedure Laterality Date  . Knee surgery     Social History:  reports that he has been smoking.  He does not have any smokeless tobacco history on file. He reports that he drinks alcohol. He reports that he does not use illicit drugs. Independent for most of his  ADL.  Allergies  Allergen Reactions  . Codeine Anxiety  . Hydrocodone Anxiety    Currently taking  hydrocodone (2.5-5mg ) and is okay.    No family history on file.  Prior to Admission medications   Medication Sig Start Date End Date Taking? Authorizing Provider  acetaminophen (TYLENOL) 325 MG tablet Take 650 mg by mouth every 6 (six) hours as needed for mild pain.   Yes Historical Provider, MD  aspirin 81 MG chewable tablet Chew 81 mg by mouth daily.   Yes Historical Provider, MD  fluticasone (FLONASE) 50 MCG/ACT nasal spray Place 2 sprays into both nostrils daily.   Yes Historical Provider, MD  gabapentin (NEURONTIN) 300 MG capsule Take 300 mg by mouth 3 (three) times daily.   Yes Historical Provider, MD  HYDROcodone-acetaminophen (NORCO/VICODIN) 5-325 MG per tablet Take 0.5-1 tablets by mouth every 6 (six) hours as needed for moderate pain or severe pain.   Yes Historical Provider, MD  simvastatin (ZOCOR) 40 MG tablet Take 1 tablet (40 mg total) by mouth at bedtime. 06/14/13  Yes Lorretta Harp, MD  zolpidem (AMBIEN) 5 MG tablet Take 2.5 mg by mouth at bedtime as needed for sleep.   Yes Historical Provider, MD    Physical Exam: Filed Vitals:   08/24/13 0130 08/24/13 0200 08/24/13 0245 08/24/13 0342  BP: 119/76 127/71 114/68 117/73  Pulse: 74 74 70 65  Temp:    97.6 F (36.4 C)  TempSrc:    Oral  Resp: 22 14 17 18   Height:    5\' 11"  (1.803 m)  Weight:    87 kg (191 lb 12.8 oz)  SpO2: 91% 94% 94% 96%  General: Alert, Awake and Oriented to Time, Place and Person. Appear in moderate distress Eyes: PERRL ENT: Oral Mucosa clear moist. Neck: No JVD Cardiovascular: S1 and S2 Present, no Murmur, Peripheral Pulses Present Respiratory: Bilateral Air entry equal and Decreased, Clear to Auscultation,  No Crackles, no wheezes Abdomen: Bowel Sound Present, Soft and minimally diffuse tender left lower quadrant no guarding no rigidity Skin: No Rash Extremities: No Pedal edema, no calf tenderness Neurologic: Grossly Unremarkable. Labs on Admission:  CBC:  Recent Labs Lab  08/23/13 2121  WBC 15.3*  HGB 17.3*  HCT 47.6  MCV 84.8  PLT 210    CMP     Component Value Date/Time   NA 138 08/23/2013 2121   K 3.5* 08/23/2013 2121   CL 98 08/23/2013 2121   CO2 18* 08/23/2013 2121   GLUCOSE 158* 08/23/2013 2121   BUN 12 08/23/2013 2121   CREATININE 1.33 08/23/2013 2121   CREATININE 1.19 03/18/2013 0826   CALCIUM 8.8 08/23/2013 2121   PROT 7.0 03/18/2013 0826   ALBUMIN 4.2 03/18/2013 0826   AST 23 03/18/2013 0826   ALT 26 03/18/2013 0826   ALKPHOS 59 03/18/2013 0826   BILITOT 1.0 03/18/2013 0826   GFRNONAA 54* 08/23/2013 2121   GFRAA 63* 08/23/2013 2121    No results found for this basename: LIPASE, AMYLASE,  in the last 168 hours No results found for this basename: AMMONIA,  in the last 168 hours   Recent Labs Lab 08/24/13 0038 08/24/13 0235  TROPONINI 0.97* 1.19*   BNP (last 3 results)  Recent Labs  08/23/13 2121  PROBNP 27.4    Radiological Exams on Admission: Ct Angio Chest Pe W/cm &/or Wo Cm  08/24/2013   CLINICAL DATA:  Shortness of breath  EXAM: CT ANGIOGRAPHY CHEST WITH CONTRAST  TECHNIQUE: Multidetector CT imaging of the chest was performed using the standard protocol during bolus administration of intravenous contrast. Multiplanar CT image reconstructions including MIPs were obtained to evaluate the vascular anatomy.  CONTRAST:  171mL OMNIPAQUE IOHEXOL 350 MG/ML SOLN  COMPARISON:  08/23/2013 radiograph  FINDINGS: The pulmonary arterial branch vessels are patent.  Normal caliber aorta with scattered atherosclerotic disease.  Upper normal to mildly enlarged heart size with left ventricular hypertrophy. Coronary artery and aortic valvular calcifications. Trace pericardial fluid. No pleural effusion. No intrathoracic lymphadenopathy.  Upper abdominal images show a cyst arising from the right kidney anteriorly.  Lobular attenuation along the right margin of the trachea. Central airways are otherwise patent. Mild linear lung base opacities and mild lower  lobe peribronchial thickening. Mild apical scarring.  No acute osseous finding.  Review of the MIP images confirms the above findings.  IMPRESSION: No pulmonary embolism.  Lobular attenuation along the right margin of the trachea is favored to reflect mucous/secretions. Attention on follow-up.  Mild lower lobe peribronchial thickening is nonspecific. Correlate clinically for mild bronchitis. Mild bibasilar opacities, favor atelectasis.  Heart size upper normal to mildly enlarged with left ventricular hypertrophy. Coronary artery calcifications.   Electronically Signed   By: Carlos Levering M.D.   On: 08/24/2013 00:34   Ct Abdomen Pelvis W Contrast  08/24/2013   CLINICAL DATA:  Abdominal pain  EXAM: CT ABDOMEN AND PELVIS WITH CONTRAST  TECHNIQUE: Multidetector CT imaging of the abdomen and pelvis was performed using the standard protocol following bolus administration of intravenous contrast.  CONTRAST:  120mL OMNIPAQUE IOHEXOL 350 MG/ML SOLN  COMPARISON:  None.  FINDINGS: See separate chest CT report. Small hiatal hernia and mild  distal esophageal wall thickening.  Decreased attenuation of the liver is nonspecific post contrast however can be seen in the setting of fatty infiltration. No radiodense gallstones or biliary ductal dilatation. No appreciable abnormality of the pancreas, adrenal glands, spleen.  Left renal atrophy and fullness of the left renal pelvis. No hydroureter. Multiple bilateral renal cysts. Nonobstructing stone on the right. No hydroureteronephrosis on the right.  Colonic diverticulosis. Inflamed diverticulum along the proximal sigmoid colon with adjacent ill-defined fluid, fat stranding, and fascial thickening. No loculated fluid collection. No free intraperitoneal air. No bowel obstruction. Appendix not identified. No right lower quadrant inflammation. Small duodenal diverticulum. No enlarged lymph nodes.  Moderately distended bladder with mild wall thickening. Small fat containing right  inguinal hernia. Prostate gland measures 4.5 cm transverse diameter with questionable TURP defect. Partially imaged right hydrocele.  Scattered atherosclerotic disease of the aorta and branch vessels. Mild infrarenal ectasia up to 2.6 cm.  L5 pars defects. Grade 1 anterolisthesis of L5 and S1. L5-S1 degenerative disc disease.  IMPRESSION: Acute proximal sigmoid colon diverticulitis. No free intraperitoneal air or loculated fluid collection to suggest abscess.  Small hiatal hernia and mild distal esophageal wall thickening, may reflect esophagitis or GERD.  Suspect hepatic steatosis.  Left renal atrophy and bilateral renal cysts. Nonobstructing interpolar/ lower pole right renal stone. Left renal pelvis fullness is likely chronic.  Moderately distended bladder with mild wall thickening. May reflect chronic partial outlet obstruction. Correlate with urinalysis to exclude cystitis.   Electronically Signed   By: Carlos Levering M.D.   On: 08/24/2013 00:41   Dg Chest Port 1 View  08/23/2013   CLINICAL DATA:  Short of breath.  Negative for chest pain.  EXAM: PORTABLE CHEST - 1 VIEW  COMPARISON:  11/14/2004.  FINDINGS: Cardiopericardial silhouette within normal limits. Mediastinal contours normal. Trachea midline. No airspace disease or effusion. Monitoring leads project over the chest. Mild basilar atelectasis.  IMPRESSION: No active disease.   Electronically Signed   By: Dereck Ligas M.D.   On: 08/23/2013 21:54    EKG: Independently reviewed. normal sinus rhythm, nonspecific ST and T waves changes.  Assessment/Plan Principal Problem:   NSTEMI (non-ST elevated myocardial infarction) Active Problems:   Diverticulitis   1. NSTEMI (non-ST elevated myocardial infarction) The patient is presenting with complaints of shortness of breath. Is an initial EKG did not show any acute abnormality but his troponin were positive and his repeat troponins were peaking up. Cardiology has been consulted and the patient  has been started on heparin and aspirin. Placing him on low dose Lopressor as well as statin. In echo done in the morning Keeping the patient n.p.o.. Following serial troponins.  2.Acute diverticulitis  Patient has undergone a CT scan of abdomen which shows acute diverticulitis  Patient will be kept n.p.o.  IV Unasyn since the patient refuses to take Flagyl due to severe nausea.  Consults: Cardiology, appreciate input  DVT Prophylaxis: Heparin Nutrition: N.p.o.  Code Status: Full  Disposition: Admitted to inpatient in telemetry unit.  Author: Berle Mull, MD [facility] Pager: [phone] 08/24/2013, 4:07 AM    If 7PM-7AM, please contact night-coverage [email or web address] Password [email or web address]

## 2013-08-24 NOTE — Progress Notes (Signed)
Patient briefly seen prior to cath this am. Here with acute CP and SOB concerning for ACS as well as acute diverticulitis per CT scan. Will follow results of cardiac cath. Continue unasyn for acute diverticulitis (he refuses to take flagyl). Can advance diet to clears after he returns from cath. Will continue to follow.  Domingo Mend, MD [facility] Pager: [phone]

## 2013-08-24 NOTE — Progress Notes (Signed)
ANTICOAGULATION CONSULT NOTE  Pharmacy Consult for Heparin Indication: chest pain/ACS (s/p cath to resume 8 hours post sheath)  Allergies  Allergen Reactions  . Codeine Anxiety  . Hydrocodone Anxiety    Currently taking hydrocodone (2.5-5mg ) and is okay.    Labs:  Recent Labs  08/23/13 2121 08/24/13 0038 08/24/13 0235 08/24/13 0447 08/24/13 0907 08/24/13 1130  HGB 17.3*  --   --  14.0  --   --   HCT 47.6  --   --  39.9  --   --   PLT 210  --   --  169  --   --   APTT  --   --   --  177*  --   --   LABPROT  --   --   --  14.7  --   --   INR  --   --   --  1.17  --   --   HEPARINUNFRC  --   --   --   --   --  0.18*  CREATININE 1.33  --   --  1.16  --   --   TROPONINI  --  0.97* 1.19*  --  1.14*  --     Estimated Creatinine Clearance: 66.7 ml/min (by C-G formula based on Cr of 1.16).   Assessment: 68 yo male with SOB/elevated cardiac markers for heparin S/p cath - heparin to resume 8 hours post sheath  Goal of Therapy:  Heparin level 0.3-0.7 units/ml Monitor platelets by anticoagulation protocol: Yes   Plan:  1) Heparin drip at 1100 units / hr starting at 2130 pm 2) Follow up AM labs  Thank you. Anette Guarneri, PharmD [phone] 08/24/2013,3:55 PM

## 2013-08-24 NOTE — Progress Notes (Addendum)
Patient ID: Adrian [name], male   DOB: 1946/06/01, 68 y.o.   MRN: [id number]    SUBJECTIVE: The patient was admitted during the night. He was seen with a full cardiology consultation. It appears that he has both acute diverticulitis and an acute coronary syndrome. When he first presented yesterday he did have slight troponin elevation. He had been my hope after reviewing the chart that we could watch him medically before proceeding with elective catheterization. However the patient has ongoing mild chest discomfort at this time. He had a sensation of impending doom yesterday. I'm quite concerned about his coronary status. We will plan to proceed with catheterization today.   Filed Vitals:   08/24/13 0200 08/24/13 0245 08/24/13 0342 08/24/13 0943  BP: 127/71 114/68 117/73 120/70  Pulse: 74 70 65 68  Temp:   97.6 F (36.4 C)   TempSrc:   Oral   Resp: 14 17 18    Height:   5\' 11"  (1.803 m)   Weight:   191 lb 12.8 oz (87 kg)   SpO2: 94% 94% 96%      Intake/Output Summary (Last 24 hours) at 08/24/13 1102 Last data filed at 08/24/13 0218  Gross per 24 hour  Intake   1000 ml  Output      0 ml  Net   1000 ml    LABS: Basic Metabolic Panel:  Recent Labs  08/23/13 2121 08/24/13 0447  NA 138 140  K 3.5* 4.2  CL 98 106  CO2 18* 19  GLUCOSE 158* 103*  BUN 12 11  CREATININE 1.33 1.16  CALCIUM 8.8 8.4  MG  --  2.1   Liver Function Tests:  Recent Labs  08/24/13 0447  AST 22  ALT 14  ALKPHOS 60  BILITOT 0.7  PROT 6.5  ALBUMIN 3.4*   No results found for this basename: LIPASE, AMYLASE,  in the last 72 hours CBC:  Recent Labs  08/23/13 2121 08/24/13 0447  WBC 15.3* 11.2*  NEUTROABS  --  7.4  HGB 17.3* 14.0  HCT 47.6 39.9  MCV 84.8 84.0  PLT 210 169   Cardiac Enzymes:  Recent Labs  08/24/13 0038 08/24/13 0235 08/24/13 0907  TROPONINI 0.97* 1.19* 1.14*   BNP: No components found with this basename: POCBNP,  D-Dimer: No results found for this basename:  DDIMER,  in the last 72 hours Hemoglobin A1C: No results found for this basename: HGBA1C,  in the last 72 hours Fasting Lipid Panel:  Recent Labs  08/24/13 0447  CHOL 119  HDL 24*  LDLCALC 69  TRIG 130  CHOLHDL 5.0   Thyroid Function Tests: No results found for this basename: TSH, T4TOTAL, FREET3, T3FREE, THYROIDAB,  in the last 72 hours  RADIOLOGY: Ct Angio Chest Pe W/cm &/or Wo Cm  08/24/2013   CLINICAL DATA:  Shortness of breath  EXAM: CT ANGIOGRAPHY CHEST WITH CONTRAST  TECHNIQUE: Multidetector CT imaging of the chest was performed using the standard protocol during bolus administration of intravenous contrast. Multiplanar CT image reconstructions including MIPs were obtained to evaluate the vascular anatomy.  CONTRAST:  126mL OMNIPAQUE IOHEXOL 350 MG/ML SOLN  COMPARISON:  08/23/2013 radiograph  FINDINGS: The pulmonary arterial branch vessels are patent.  Normal caliber aorta with scattered atherosclerotic disease.  Upper normal to mildly enlarged heart size with left ventricular hypertrophy. Coronary artery and aortic valvular calcifications. Trace pericardial fluid. No pleural effusion. No intrathoracic lymphadenopathy.  Upper abdominal images show a cyst arising from the right kidney anteriorly.  Lobular attenuation along the right margin of the trachea. Central airways are otherwise patent. Mild linear lung base opacities and mild lower lobe peribronchial thickening. Mild apical scarring.  No acute osseous finding.  Review of the MIP images confirms the above findings.  IMPRESSION: No pulmonary embolism.  Lobular attenuation along the right margin of the trachea is favored to reflect mucous/secretions. Attention on follow-up.  Mild lower lobe peribronchial thickening is nonspecific. Correlate clinically for mild bronchitis. Mild bibasilar opacities, favor atelectasis.  Heart size upper normal to mildly enlarged with left ventricular hypertrophy. Coronary artery calcifications.    Electronically Signed   By: Carlos Levering M.D.   On: 08/24/2013 00:34   Ct Abdomen Pelvis W Contrast  08/24/2013   CLINICAL DATA:  Abdominal pain  EXAM: CT ABDOMEN AND PELVIS WITH CONTRAST  TECHNIQUE: Multidetector CT imaging of the abdomen and pelvis was performed using the standard protocol following bolus administration of intravenous contrast.  CONTRAST:  117mL OMNIPAQUE IOHEXOL 350 MG/ML SOLN  COMPARISON:  None.  FINDINGS: See separate chest CT report. Small hiatal hernia and mild distal esophageal wall thickening.  Decreased attenuation of the liver is nonspecific post contrast however can be seen in the setting of fatty infiltration. No radiodense gallstones or biliary ductal dilatation. No appreciable abnormality of the pancreas, adrenal glands, spleen.  Left renal atrophy and fullness of the left renal pelvis. No hydroureter. Multiple bilateral renal cysts. Nonobstructing stone on the right. No hydroureteronephrosis on the right.  Colonic diverticulosis. Inflamed diverticulum along the proximal sigmoid colon with adjacent ill-defined fluid, fat stranding, and fascial thickening. No loculated fluid collection. No free intraperitoneal air. No bowel obstruction. Appendix not identified. No right lower quadrant inflammation. Small duodenal diverticulum. No enlarged lymph nodes.  Moderately distended bladder with mild wall thickening. Small fat containing right inguinal hernia. Prostate gland measures 4.5 cm transverse diameter with questionable TURP defect. Partially imaged right hydrocele.  Scattered atherosclerotic disease of the aorta and branch vessels. Mild infrarenal ectasia up to 2.6 cm.  L5 pars defects. Grade 1 anterolisthesis of L5 and S1. L5-S1 degenerative disc disease.  IMPRESSION: Acute proximal sigmoid colon diverticulitis. No free intraperitoneal air or loculated fluid collection to suggest abscess.  Small hiatal hernia and mild distal esophageal wall thickening, may reflect esophagitis or  GERD.  Suspect hepatic steatosis.  Left renal atrophy and bilateral renal cysts. Nonobstructing interpolar/ lower pole right renal stone. Left renal pelvis fullness is likely chronic.  Moderately distended bladder with mild wall thickening. May reflect chronic partial outlet obstruction. Correlate with urinalysis to exclude cystitis.   Electronically Signed   By: Carlos Levering M.D.   On: 08/24/2013 00:41   Dg Chest Port 1 View  08/23/2013   CLINICAL DATA:  Short of breath.  Negative for chest pain.  EXAM: PORTABLE CHEST - 1 VIEW  COMPARISON:  11/14/2004.  FINDINGS: Cardiopericardial silhouette within normal limits. Mediastinal contours normal. Trachea midline. No airspace disease or effusion. Monitoring leads project over the chest. Mild basilar atelectasis.  IMPRESSION: No active disease.   Electronically Signed   By: Dereck Ligas M.D.   On: 08/23/2013 21:54    PHYSICAL EXAM  patient is oriented to person time and place. Affect is normal. Lungs reveal scattered rhonchi. Cardiac exam reveals S1 and S2. There is no significant peripheral edema.   TELEMETRY: I have reviewed telemetry. August 16, 2013. There is normal sinus rhythm.   ASSESSMENT AND PLAN:    NSTEMI (non-ST elevated myocardial infarction)  In the first sentences at this note above, I outlined the fact that I am quite concerned about this patient's cardiac status. He does have diverticulitis. He has had this before. Despite the current discomfort. It is not as severe as prior episodes. There is no active GI bleeding. After very careful assessment I feel that we should proceed with cardiac catheterization. He is having ongoing chest discomfort at this time. The patient and family are in agreement. Plans are being made.    Diverticulitis      The patient is receiving antibiotics. He is also to be n.p.o. for this problem until the internal medicine team adjusted his diet.    Abnormal finding on abdominal CT including left renal  atrophy, bilateral renal cysts, nonobstructing lower pole right renal stone, moderately distended bladder.      Any further workup of this issue will be by internal medicine.  As part of today's evaluation I spent greater than 30 minutes critical care time with the overall assessment including seeing the patient thought that the family in making arrangements for the Cath Lab to proceed on a semiurgent basis.   Dola Argyle 08/24/2013 11:02 AM

## 2013-08-24 NOTE — Interval H&P Note (Signed)
History and Physical Interval Note:  08/24/2013 12:54 PM  Adrian [name]  has presented today for surgery, with the diagnosis of ongoing cp  The various methods of treatment have been discussed with the patient and family. After consideration of risks, benefits and other options for treatment, the patient has consented to  Procedure(s): LEFT HEART CATHETERIZATION WITH CORONARY ANGIOGRAM (N/A) as a surgical intervention .  The patient's history has been reviewed, patient examined, no change in status, stable for surgery.  I have reviewed the patient's chart and labs.  Questions were answered to the patient's satisfaction.    Is a difficult clinical situation. The patient has active diverticulitis. He has not ST elevation ACS. We will perform catheterization to define anatomy. Therapeutic options will be somewhat difficult because he may require surgery if diverticulitis is not improved. The procedure and risks of bleeding, stroke, death, myocardial infarction, kidney failure (1 kidney), limb ischemia among others were discussed in detail with the patient and except [name]

## 2013-08-24 NOTE — Consult Note (Signed)
CARDIOLOGY CONSULT NOTE  Patient ID: JESUS NEVILLS, MRN: [id number], DOB/AGE: 28-Nov-1945 68 y.o. Admit date: 08/23/2013 Date of Consult: 08/24/2013  Primary Physician: [name], MD Primary Cardiologist: unassigned  Chief Complaint: shortness of breath Reason for Consultation: elevated troponin  HPI: 68 y.o. male w/ PMHx significant for hyperlipidemia, diverticular dz, chronic back pain who presented to [facility] on 08/23/2013 with complaints of acute onset of shortness of breath and LLQ pain. Pt reports on Monday, he was feeling at his normal baseline. However, on Tuesday morning when he awoke, he had LLQ discomfort which he knew was similar to his past history of diverticulitis. He called his PCP for a Rx for abx. However, later in the evening, he experienced sudden onset of "impending doom" and acute shortness of breath. Unlike anything he has every experienced before. Associated with nausea, headache and later, a discomfort across his chest. During the ambulance ride, he reports he was hypoxic at 91% on 100% oxygen and hypotensive to the 80s (he is a retired Audiological scientist). Upon arrival at the [facility] ER, he was found to be extremis. However, with time and oxygen, his breathing improved and he neared baseline. Currently he feels back to normal except for his significant LLQ pain and a slight soreness in his chest.  He previously saw Dr. Rollene Fare for cardiology- had a stress test approx 5 yrs ago and an echo due to chest pains that he associates now with work stress. Last saw Dr. Rollene Fare this past fall prior to his retirement and was told his heart was fine and that he was low risk for CV events.  Recent workup includes an echo as pre-op eval for knee surgery. Aortic sclerosis, nl EF.  Initial EKG had baseline artifact interfering with interpretation but follow up EKG was completely normal. Initial POC troponin was negative but follow up was 0.9. CXray neg. PE protocol CT  negative for PE. BNP wnl. ASA given.  Past Medical History  Diagnosis Date  . Diverticulitis   . Hyperlipidemia   syncope 2007    Surgical History:  Past Surgical History  Procedure Laterality Date  . Knee surgery       Home Meds: Prior to Admission medications   Medication Sig Start Date End Date Taking? Authorizing Provider  acetaminophen (TYLENOL) 325 MG tablet Take 650 mg by mouth every 6 (six) hours as needed for mild pain.   Yes Historical Provider, MD  aspirin 81 MG chewable tablet Chew 81 mg by mouth daily.   Yes Historical Provider, MD  fluticasone (FLONASE) 50 MCG/ACT nasal spray Place 2 sprays into both nostrils daily.   Yes Historical Provider, MD  gabapentin (NEURONTIN) 300 MG capsule Take 300 mg by mouth 3 (three) times daily.   Yes Historical Provider, MD  HYDROcodone-acetaminophen (NORCO/VICODIN) 5-325 MG per tablet Take 0.5-1 tablets by mouth every 6 (six) hours as needed for moderate pain or severe pain.   Yes Historical Provider, MD  simvastatin (ZOCOR) 40 MG tablet Take 1 tablet (40 mg total) by mouth at bedtime. 06/14/13  Yes Lorretta Harp, MD  zolpidem (AMBIEN) 5 MG tablet Take 2.5 mg by mouth at bedtime as needed for sleep.   Yes Historical Provider, MD    Inpatient Medications:  . aspirin  324 mg Oral Once  .  HYDROmorphone (DILAUDID) injection  0.5 mg Intravenous Once   . ciprofloxacin    . metronidazole      Allergies:  Allergies  Allergen Reactions  . Codeine  Anxiety  . Hydrocodone Anxiety    Currently taking hydrocodone (2.5-5mg ) and is okay.    History   Social History  . Marital Status: Married    Spouse Name: N/A    Number of Children: N/A  . Years of Education: N/A   Occupational History  . Not on file.   Social History Main Topics  . Smoking status: Current Every Day Smoker  . Smokeless tobacco: Not on file  . Alcohol Use: Yes     Comment: occ  . Drug Use: No  . Sexual Activity: Not on file   Other Topics Concern  . Not  on file   Social History Narrative  . No narrative on file     No family history on file.   Review of Systems: General: negative for chills, fever, night sweats or weight changes.  Cardiovascular: see HPI Dermatological: negative for rash Respiratory: negative for cough or wheezing Urologic: negative for hematuria Abdominal: negative for vomiting, diarrhea, bright red blood per rectum, melena, or hematemesis. +Abdominal discomfort Neurologic: negative for visual changes, syncope, or dizziness. +Chronic LBP All other systems reviewed and are otherwise negative except as noted above.  Labs:  Recent Labs  08/24/13 0038  TROPONINI 0.97*   Lab Results  Component Value Date   WBC 15.3* 08/23/2013   HGB 17.3* 08/23/2013   HCT 47.6 08/23/2013   MCV 84.8 08/23/2013   PLT 210 08/23/2013    Recent Labs Lab 08/23/13 2121  NA 138  K 3.5*  CL 98  CO2 18*  BUN 12  CREATININE 1.33  CALCIUM 8.8  GLUCOSE 158*   Lab Results  Component Value Date   CHOL 145 03/18/2013   HDL 26* 03/18/2013   LDLCALC 53 03/18/2013   TRIG 330* 03/18/2013   No results found for this basename: DDIMER    Radiology/Studies:  Ct Angio Chest Pe W/cm &/or Wo Cm  08/24/2013   CLINICAL DATA:  Shortness of breath  EXAM: CT ANGIOGRAPHY CHEST WITH CONTRAST  TECHNIQUE: Multidetector CT imaging of the chest was performed using the standard protocol during bolus administration of intravenous contrast. Multiplanar CT image reconstructions including MIPs were obtained to evaluate the vascular anatomy.  CONTRAST:  163mL OMNIPAQUE IOHEXOL 350 MG/ML SOLN  COMPARISON:  08/23/2013 radiograph  FINDINGS: The pulmonary arterial branch vessels are patent.  Normal caliber aorta with scattered atherosclerotic disease.  Upper normal to mildly enlarged heart size with left ventricular hypertrophy. Coronary artery and aortic valvular calcifications. Trace pericardial fluid. No pleural effusion. No intrathoracic lymphadenopathy.  Upper  abdominal images show a cyst arising from the right kidney anteriorly.  Lobular attenuation along the right margin of the trachea. Central airways are otherwise patent. Mild linear lung base opacities and mild lower lobe peribronchial thickening. Mild apical scarring.  No acute osseous finding.  Review of the MIP images confirms the above findings.  IMPRESSION: No pulmonary embolism.  Lobular attenuation along the right margin of the trachea is favored to reflect mucous/secretions. Attention on follow-up.  Mild lower lobe peribronchial thickening is nonspecific. Correlate clinically for mild bronchitis. Mild bibasilar opacities, favor atelectasis.  Heart size upper normal to mildly enlarged with left ventricular hypertrophy. Coronary artery calcifications.   Electronically Signed   By: Carlos Levering M.D.   On: 08/24/2013 00:34   Ct Abdomen Pelvis W Contrast  08/24/2013   CLINICAL DATA:  Abdominal pain  EXAM: CT ABDOMEN AND PELVIS WITH CONTRAST  TECHNIQUE: Multidetector CT imaging of the abdomen and pelvis was  performed using the standard protocol following bolus administration of intravenous contrast.  CONTRAST:  110mL OMNIPAQUE IOHEXOL 350 MG/ML SOLN  COMPARISON:  None.  FINDINGS: See separate chest CT report. Small hiatal hernia and mild distal esophageal wall thickening.  Decreased attenuation of the liver is nonspecific post contrast however can be seen in the setting of fatty infiltration. No radiodense gallstones or biliary ductal dilatation. No appreciable abnormality of the pancreas, adrenal glands, spleen.  Left renal atrophy and fullness of the left renal pelvis. No hydroureter. Multiple bilateral renal cysts. Nonobstructing stone on the right. No hydroureteronephrosis on the right.  Colonic diverticulosis. Inflamed diverticulum along the proximal sigmoid colon with adjacent ill-defined fluid, fat stranding, and fascial thickening. No loculated fluid collection. No free intraperitoneal air. No bowel  obstruction. Appendix not identified. No right lower quadrant inflammation. Small duodenal diverticulum. No enlarged lymph nodes.  Moderately distended bladder with mild wall thickening. Small fat containing right inguinal hernia. Prostate gland measures 4.5 cm transverse diameter with questionable TURP defect. Partially imaged right hydrocele.  Scattered atherosclerotic disease of the aorta and branch vessels. Mild infrarenal ectasia up to 2.6 cm.  L5 pars defects. Grade 1 anterolisthesis of L5 and S1. L5-S1 degenerative disc disease.  IMPRESSION: Acute proximal sigmoid colon diverticulitis. No free intraperitoneal air or loculated fluid collection to suggest abscess.  Small hiatal hernia and mild distal esophageal wall thickening, may reflect esophagitis or GERD.  Suspect hepatic steatosis.  Left renal atrophy and bilateral renal cysts. Nonobstructing interpolar/ lower pole right renal stone. Left renal pelvis fullness is likely chronic.  Moderately distended bladder with mild wall thickening. May reflect chronic partial outlet obstruction. Correlate with urinalysis to exclude cystitis.   Electronically Signed   By: Carlos Levering M.D.   On: 08/24/2013 00:41   Dg Chest Port 1 View  08/23/2013   CLINICAL DATA:  Short of breath.  Negative for chest pain.  EXAM: PORTABLE CHEST - 1 VIEW  COMPARISON:  11/14/2004.  FINDINGS: Cardiopericardial silhouette within normal limits. Mediastinal contours normal. Trachea midline. No airspace disease or effusion. Monitoring leads project over the chest. Mild basilar atelectasis.  IMPRESSION: No active disease.   Electronically Signed   By: Dereck Ligas M.D.   On: 08/23/2013 21:54    EKG: sinus, no ST or TW changes  Physical Exam: Blood pressure 137/77, pulse 70, temperature 96.7 F (35.9 C), temperature source Oral, resp. rate 16, SpO2 95.00%. General: Well developed, well nourished, in no acute distress. Head: Normocephalic, atraumatic, sclera non-icteric, no  xanthomas, nares are without discharge.  Neck: Supple. Negative for carotid bruits. JVD not elevated. Lungs: Clear bilaterally to auscultation without wheezes, rales, or rhonchi. Breathing is unlabored. Heart: RRR with S1 S2. No murmurs, rubs, or gallops appreciated. Abdomen: Soft, overactive BS. No hepatomegaly. Tender in LLQ.  Msk:  Strength and tone appear normal for age. Extremities: No clubbing or cyanosis. No edema.  Distal pedal pulses are 2+ and equal bilaterally. Neuro: Alert and oriented X 3. Moves all extremities spontaneously. Psych:  Responds to questions appropriately with a normal affect.   Problem List 1. Acute respiratory distress, significantly improved 2. Chest pain, with elevated troponin, consistent with NSTEMI. 3. Diverticulitis by CT 4. Elevated WBC count, secondary to #3. 5. Hyperlipidemia 6. Hiatal hernia and esophagitis by CT 7. Tobacco abuse  Assessment and Plan:  68 y.o. male w/ PMHx significant for hyperlipidemia, diverticular dz, chronic back pain who presented to [facility] on 08/23/2013 with complaints of acute onset of  shortness of breath/chest pain and LLQ pain. Found to have elevated troponin consistent with NSTEMI and diverticulitis on abdominal CT.  The presentation of acute respiratory distress and the reported magnitude of the distress is somewhat atypical for acute coronary syndrome especially considering it self resolved. Searching for other etiologies include PE, dissection, acute decompensated CHF has thus far been negative (CT PE protocol, cxray, BNP). This leaves acute coronary syndrome in the differential for which his risk factors include age, gender, hyperlipidemia, continue tobacco abuse and coronary calcifications seen on CT.  Encouragingly, his symptoms have essentially resolved and his EKG is without changes.   In the setting of NSTEMI, early invasive strategy of catheterization is preferred; however, given his active GI issues of  diverticulitis, we would like to see that stabilize prior a cath. Would keep him NPO overnight in case he is appropriate tomorrow.   Continue statin, aspirin. Would start low dose beta blocker, metoprolol 12.5 bid (low dose due to normotensive at baseline). Smoking cessation is a must.  Recommendations: -statin, aspirin, beta blocker -cycle enzymes to peak -keep NPO in case he is a candidate for left heart catheterization in the AM -smoking cessation -if follow troponin +, would heparinize for acute coronary syndrome   Signed, Jilberto Vanderwall C. MD 08/24/2013, 1:51 AM

## 2013-08-25 ENCOUNTER — Inpatient Hospital Stay (HOSPITAL_COMMUNITY): Payer: Non-veteran care

## 2013-08-25 DIAGNOSIS — I2510 Atherosclerotic heart disease of native coronary artery without angina pectoris: Secondary | ICD-10-CM

## 2013-08-25 DIAGNOSIS — Z01810 Encounter for preprocedural cardiovascular examination: Secondary | ICD-10-CM

## 2013-08-25 LAB — COMPREHENSIVE METABOLIC PANEL
ALBUMIN: 3.2 g/dL — AB (ref 3.5–5.2)
ALT: 14 U/L (ref 0–53)
AST: 21 U/L (ref 0–37)
Alkaline Phosphatase: 56 U/L (ref 39–117)
BUN: 14 mg/dL (ref 6–23)
CHLORIDE: 107 meq/L (ref 96–112)
CO2: 21 mEq/L (ref 19–32)
CREATININE: 1.17 mg/dL (ref 0.50–1.35)
Calcium: 8.1 mg/dL — ABNORMAL LOW (ref 8.4–10.5)
GFR calc Af Amer: 73 mL/min — ABNORMAL LOW (ref >90)
GFR calc non Af Amer: 63 mL/min — ABNORMAL LOW (ref >90)
GLUCOSE: 95 mg/dL (ref 70–99)
Potassium: 4.5 mEq/L (ref 3.7–5.3)
Sodium: 141 mEq/L (ref 137–147)
Total Bilirubin: 0.5 mg/dL (ref 0.3–1.2)
Total Protein: 6 g/dL (ref 6.0–8.3)

## 2013-08-25 LAB — CBC
HEMATOCRIT: 35.4 % — AB (ref 39.0–52.0)
Hemoglobin: 12.2 g/dL — ABNORMAL LOW (ref 13.0–17.0)
MCH: 29.5 pg (ref 26.0–34.0)
MCHC: 34.5 g/dL (ref 30.0–36.0)
MCV: 85.5 fL (ref 78.0–100.0)
PLATELETS: 136 10*3/uL — AB (ref 150–400)
RBC: 4.14 MIL/uL — ABNORMAL LOW (ref 4.22–5.81)
RDW: 14.6 % (ref 11.5–15.5)
WBC: 8.6 10*3/uL (ref 4.0–10.5)

## 2013-08-25 LAB — TYPE AND SCREEN
ABO/RH(D): O NEG
Antibody Screen: NEGATIVE

## 2013-08-25 LAB — SURGICAL PCR SCREEN
MRSA, PCR: NEGATIVE
Staphylococcus aureus: NEGATIVE

## 2013-08-25 LAB — HEPARIN LEVEL (UNFRACTIONATED)
HEPARIN UNFRACTIONATED: 0.31 [IU]/mL (ref 0.30–0.70)
Heparin Unfractionated: 0.37 IU/mL (ref 0.30–0.70)

## 2013-08-25 LAB — TROPONIN I: Troponin I: 0.97 ng/mL (ref <0.30)

## 2013-08-25 MED ORDER — PHENYLEPHRINE HCL 10 MG/ML IJ SOLN
30.0000 ug/min | INTRAVENOUS | Status: AC
  Administered 2013-08-26: 10 ug/min via INTRAVENOUS
  Filled 2013-08-25: qty 2

## 2013-08-25 MED ORDER — ALBUTEROL SULFATE (2.5 MG/3ML) 0.083% IN NEBU
2.5000 mg | INHALATION_SOLUTION | Freq: Once | RESPIRATORY_TRACT | Status: AC
  Administered 2013-08-25: 2.5 mg via RESPIRATORY_TRACT

## 2013-08-25 MED ORDER — MAGNESIUM SULFATE 50 % IJ SOLN
40.0000 meq | INTRAMUSCULAR | Status: DC
  Filled 2013-08-25: qty 10

## 2013-08-25 MED ORDER — VANCOMYCIN HCL 10 G IV SOLR
1500.0000 mg | INTRAVENOUS | Status: AC
  Administered 2013-08-26: 1500 mg via INTRAVENOUS
  Filled 2013-08-25: qty 1500

## 2013-08-25 MED ORDER — LORAZEPAM 2 MG/ML IJ SOLN: 1.0000 mg | Freq: Four times a day (QID) | INTRAMUSCULAR | Status: DC | PRN

## 2013-08-25 MED ORDER — DEXMEDETOMIDINE HCL IN NACL 400 MCG/100ML IV SOLN
0.1000 ug/kg/h | INTRAVENOUS | Status: AC
  Administered 2013-08-26: 0.3 ug/kg/h via INTRAVENOUS
  Filled 2013-08-25: qty 100

## 2013-08-25 MED ORDER — SODIUM CHLORIDE 0.9 % IV SOLN
INTRAVENOUS | Status: DC
Start: 2013-08-26 — End: 2013-08-26
  Filled 2013-08-25: qty 30

## 2013-08-25 MED ORDER — DEXTROSE 5 % IV SOLN
1.5000 g | INTRAVENOUS | Status: AC
  Administered 2013-08-26: 1.5 g via INTRAVENOUS
  Administered 2013-08-26: .75 g via INTRAVENOUS
  Filled 2013-08-25: qty 1.5

## 2013-08-25 MED ORDER — CHLORHEXIDINE GLUCONATE 4 % EX LIQD
60.0000 mL | Freq: Once | CUTANEOUS | Status: AC
  Administered 2013-08-25: 4 via TOPICAL
  Filled 2013-08-25: qty 60

## 2013-08-25 MED ORDER — THIAMINE HCL 100 MG/ML IJ SOLN
100.0000 mg | Freq: Every day | INTRAMUSCULAR | Status: DC
  Filled 2013-08-25 (×2): qty 1

## 2013-08-25 MED ORDER — ADULT MULTIVITAMIN W/MINERALS CH
1.0000 | ORAL_TABLET | Freq: Every day | ORAL | Status: DC
  Administered 2013-08-25: 1 via ORAL
  Filled 2013-08-25 (×2): qty 1

## 2013-08-25 MED ORDER — NITROGLYCERIN IN D5W 200-5 MCG/ML-% IV SOLN
2.0000 ug/min | INTRAVENOUS | Status: DC
  Filled 2013-08-25: qty 250

## 2013-08-25 MED ORDER — TEMAZEPAM 15 MG PO CAPS: 15.0000 mg | ORAL_CAPSULE | Freq: Once | ORAL | Status: AC | PRN

## 2013-08-25 MED ORDER — METOPROLOL TARTRATE 12.5 MG HALF TABLET
12.5000 mg | ORAL_TABLET | Freq: Once | ORAL | Status: DC
  Filled 2013-08-25: qty 1

## 2013-08-25 MED ORDER — PLASMA-LYTE 148 IV SOLN
INTRAVENOUS | Status: AC
  Administered 2013-08-26: 09:00:00
  Filled 2013-08-25: qty 2.5

## 2013-08-25 MED ORDER — LORAZEPAM 1 MG PO TABS: 1.0000 mg | ORAL_TABLET | Freq: Four times a day (QID) | ORAL | Status: DC | PRN

## 2013-08-25 MED ORDER — DOPAMINE-DEXTROSE 3.2-5 MG/ML-% IV SOLN
2.0000 ug/kg/min | INTRAVENOUS | Status: DC
  Filled 2013-08-25: qty 250

## 2013-08-25 MED ORDER — VITAMIN B-1 100 MG PO TABS
100.0000 mg | ORAL_TABLET | Freq: Every day | ORAL | Status: DC
  Administered 2013-08-25: 100 mg via ORAL
  Filled 2013-08-25 (×2): qty 1

## 2013-08-25 MED ORDER — CHLORHEXIDINE GLUCONATE 4 % EX LIQD
60.0000 mL | Freq: Once | CUTANEOUS | Status: AC
  Administered 2013-08-26: 4 via TOPICAL
  Filled 2013-08-25: qty 60

## 2013-08-25 MED ORDER — ALPRAZOLAM 0.25 MG PO TABS: 0.2500 mg | ORAL_TABLET | ORAL | Status: DC | PRN

## 2013-08-25 MED ORDER — SODIUM CHLORIDE 0.9 % IV SOLN
INTRAVENOUS | Status: AC
  Administered 2013-08-26: 69.8 mL/h via INTRAVENOUS
  Administered 2013-08-26: 11:00:00 via INTRAVENOUS
  Filled 2013-08-25: qty 40

## 2013-08-25 MED ORDER — SODIUM CHLORIDE 0.9 % IV SOLN
INTRAVENOUS | Status: AC
  Administered 2013-08-26: .9 [IU]/h via INTRAVENOUS
  Filled 2013-08-25: qty 1

## 2013-08-25 MED ORDER — FOLIC ACID 1 MG PO TABS
1.0000 mg | ORAL_TABLET | Freq: Every day | ORAL | Status: DC
  Administered 2013-08-25: 1 mg via ORAL
  Filled 2013-08-25 (×2): qty 1

## 2013-08-25 MED ORDER — POTASSIUM CHLORIDE 2 MEQ/ML IV SOLN
80.0000 meq | INTRAVENOUS | Status: DC
Start: 2013-08-26 — End: 2013-08-26
  Filled 2013-08-25: qty 40

## 2013-08-25 MED ORDER — DEXTROSE 5 % IV SOLN
750.0000 mg | INTRAVENOUS | Status: DC
  Filled 2013-08-25 (×2): qty 750

## 2013-08-25 MED ORDER — EPINEPHRINE HCL 1 MG/ML IJ SOLN
0.5000 ug/min | INTRAVENOUS | Status: DC
  Filled 2013-08-25: qty 4

## 2013-08-25 MED ORDER — BISACODYL 5 MG PO TBEC
5.0000 mg | DELAYED_RELEASE_TABLET | Freq: Once | ORAL | Status: AC
Start: 2013-08-25 — End: 2013-08-25
  Administered 2013-08-25: 5 mg via ORAL
  Filled 2013-08-25: qty 1

## 2013-08-25 MED FILL — Sodium Chloride IV Soln 0.9%: INTRAVENOUS | Qty: 50 | Status: AC

## 2013-08-25 NOTE — Progress Notes (Signed)
1 Day Post-Op Procedure(s) (LRB): LEFT HEART CATHETERIZATION WITH CORONARY ANGIOGRAM (N/A) Subjective: C/o being uncomfortable due to back pain Abdominal pain decreased, no nausea, vomiting or diarrhea No CP or SOB  Objective: Vital signs in last 24 hours: Temp:  [98 F (36.7 C)-99.3 F (37.4 C)] 98.1 F (36.7 C) (01/29 1100) Pulse Rate:  [49-70] 55 (01/29 1100) Cardiac Rhythm:  [-] Sinus bradycardia (01/29 0800) Resp:  [16-19] 16 (01/29 1100) BP: (88-118)/(49-64) 98/49 mmHg (01/29 1100) SpO2:  [93 %-97 %] 95 % (01/29 1100) Weight:  [202 lb 6.1 oz (91.8 kg)] 202 lb 6.1 oz (91.8 kg) (01/29 0500)  Hemodynamic parameters for last 24 hours:    Intake/Output from previous day: 01/28 0701 - 01/29 0700 In: 2114.2 [P.O.:240; I.V.:1574.2; IV Piggyback:300] Out: -  Intake/Output this shift: Total I/O In: 300 [P.O.:200; IV Piggyback:100] Out: 1350 [Urine:1350]  General appearance: alert and no distress Neurologic: intact Heart: regular rate and rhythm Lungs: clear to auscultation bilaterally Abdomen: soft, mild LLQ tenderness  Lab Results:  Recent Labs  08/24/13 0447 08/25/13 0304  WBC 11.2* 8.6  HGB 14.0 12.2*  HCT 39.9 35.4*  PLT 169 136*   BMET:  Recent Labs  08/24/13 0447 08/25/13 0304  NA 140 141  K 4.2 4.5  CL 106 107  CO2 19 21  GLUCOSE 103* 95  BUN 11 14  CREATININE 1.16 1.17  CALCIUM 8.4 8.1*    PT/INR:  Recent Labs  08/24/13 0447  LABPROT 14.7  INR 1.17   ABG No results found for this basename: phart, pco2, po2, hco3, tco2, acidbasedef, o2sat   CBG (last 3)  No results found for this basename: GLUCAP,  in the last 72 hours  Assessment/Plan: S/P Procedure(s) (LRB): LEFT HEART CATHETERIZATION WITH CORONARY ANGIOGRAM (N/A) - 3 vessel CAD post NQWMI. Needs CABG for complete revascularization  His abdominal discomfort has resolved for the most part. He is still mildly tender. His WBC is back to normal. D/w Dr. Harlow Asa who is familiar with the  patient. He does not think CABG needs to be delayed. Given the suddenness of his presentation, I think it is best that we get him revascularized ASAP.  I discussed with Mr. Mehringer the indications, risks, benefits and alternatives. He understands the risks include but are not limited to death, stroke, MI, DVT, PE, bleeding, possible need for transfusion, infection, cardiac arrhythmias, other organ system dysfunction- respiratory, renal or GI. He accepts the risks and agrees to proceed.  For CABG in AM   LOS: 2 days    Daya Dutt C 08/25/2013

## 2013-08-25 NOTE — Progress Notes (Signed)
ANTICOAGULATION CONSULT NOTE - Follow Up Consult  Pharmacy Consult for Heparin Indication: CAD  Allergies  Allergen Reactions  . Codeine Anxiety  . Hydrocodone Anxiety    Currently taking hydrocodone (2.5-5mg ) and is okay.    Patient Measurements: Height: 5\' 11"  (180.3 cm) Weight: 202 lb 6.1 oz (91.8 kg) IBW/kg (Calculated) : 75.3 Heparin Dosing Weight: 91.8 kg  Vital Signs: Temp: 98.1 F (36.7 C) (01/29 1100) Temp src: Oral (01/29 1100) BP: 98/49 mmHg (01/29 1100) Pulse Rate: 55 (01/29 1100)  Labs:  Recent Labs  08/23/13 2121  08/24/13 0235 08/24/13 0447 08/24/13 0907 08/24/13 1130 08/24/13 2320 08/25/13 0304 08/25/13 1300  HGB 17.3*  --   --  14.0  --   --   --  12.2*  --   HCT 47.6  --   --  39.9  --   --   --  35.4*  --   PLT 210  --   --  169  --   --   --  136*  --   APTT  --   --   --  177*  --   --   --   --   --   LABPROT  --   --   --  14.7  --   --   --   --   --   INR  --   --   --  1.17  --   --   --   --   --   HEPARINUNFRC  --   --   --   --   --  0.18*  --  <0.10* 0.31  CREATININE 1.33  --   --  1.16  --   --   --  1.17  --   TROPONINI  --   < > 1.19*  --  1.14*  --  0.97*  --   --   < > = values in this interval not displayed.  Estimated Creatinine Clearance: 71.9 ml/min (by C-G formula based on Cr of 1.17).   Medications:  Scheduled:  . ampicillin-sulbactam (UNASYN) IV  3 g Intravenous Q6H  . aspirin EC  325 mg Oral Daily  . fluticasone  2 spray Each Nare Daily  . folic acid  1 mg Oral Daily  . gabapentin  300 mg Oral TID  . metoprolol tartrate  12.5 mg Oral BID  . multivitamin with minerals  1 tablet Oral Daily  . simvastatin  40 mg Oral QHS  . thiamine  100 mg Oral Daily   Or  . thiamine  100 mg Intravenous Daily   Infusions:  . sodium chloride 20 mL/hr at 08/25/13 0000  . heparin 1,400 Units/hr (08/25/13 0530)  . nitroGLYCERIN 10 mcg/min (08/24/13 1423)    Assessment: 68 yo M with NQWMI in the setting of acute  diverticulitis.  Pt is s/p cath and needs CABG once diverticulitis improves with abx - possibly Friday.  Heparin level is 0.31 on 1400 units/hr.  Heparin level is at low end of goal.  Goal of Therapy:  Heparin level 0.3-0.7 units/ml Monitor platelets by anticoagulation protocol: Yes   Plan:  Will increase heparin infusion slightly to target mid-range of goal to 1500 units/hr. Recheck heparin level at 8pm to confirm therapeutic. Will follow-up plans for CABG and heparin infusion stop time prior to surgery.  [employer], Pharm.D., BCPS Clinical Pharmacist Pager [phone] 08/25/2013 3:37 PM

## 2013-08-25 NOTE — Progress Notes (Signed)
ANTICOAGULATION CONSULT NOTE - Follow Up Consult  Pharmacy Consult for Heparin Indication: CAD  Allergies  Allergen Reactions  . Codeine Anxiety  . Hydrocodone Anxiety    Currently taking hydrocodone (2.5-5mg ) and is okay.    Patient Measurements: Height: 5\' 11"  (180.3 cm) Weight: 202 lb 6.1 oz (91.8 kg) IBW/kg (Calculated) : 75.3 Heparin Dosing Weight: 91.8 kg  Vital Signs: Temp: 97.6 F (36.4 C) (01/29 2048) Temp src: Oral (01/29 1600) BP: 132/68 mmHg (01/29 2048) Pulse Rate: 48 (01/29 2048)  Labs:  Recent Labs  08/23/13 2121  08/24/13 0235 08/24/13 0447 08/24/13 0907  08/24/13 2320 08/25/13 0304 08/25/13 1300 08/25/13 2003  HGB 17.3*  --   --  14.0  --   --   --  12.2*  --   --   HCT 47.6  --   --  39.9  --   --   --  35.4*  --   --   PLT 210  --   --  169  --   --   --  136*  --   --   APTT  --   --   --  177*  --   --   --   --   --   --   LABPROT  --   --   --  14.7  --   --   --   --   --   --   INR  --   --   --  1.17  --   --   --   --   --   --   HEPARINUNFRC  --   --   --   --   --   < >  --  <0.10* 0.31 0.37  CREATININE 1.33  --   --  1.16  --   --   --  1.17  --   --   TROPONINI  --   < > 1.19*  --  1.14*  --  0.97*  --   --   --   < > = values in this interval not displayed.  Estimated Creatinine Clearance: 71.9 ml/min (by C-G formula based on Cr of 1.17).   Assessment: 68 yo M with NQWMI in the setting of acute diverticulitis.  Pt is s/p cath and needs CABG which is scheduled for 1/30  Heparin level is 0.37 on 1500 units/hr which is therapeutic  Goal of Therapy:  Heparin level 0.3-0.7 units/ml Monitor platelets by anticoagulation protocol: Yes   Plan:  Continue heparin at 1500 units/hr. CABG planned for the AM  Heide Guile, PharmD, [facility] Clinical Pharmacist Pager [phone]   08/25/2013 8:54 PM

## 2013-08-25 NOTE — Care Management Note (Signed)
    Page 1 of 1   08/25/2013     1:31:36 PM   CARE MANAGEMENT NOTE 08/25/2013  Patient:  BONNER, LARUE   Account Number:  [id number]  Date Initiated:  08/25/2013  Documentation initiated by:  Elissa Hefty  Subjective/Objective Assessment:   adm w mi     Action/Plan:   lives w wife, pcp dr d. cloward   Anticipated DC Date:     Anticipated DC Plan:           Choice offered to / List presented to:             Status of service:   Medicare Important Message given?   (If response is "NO", the following Medicare IM given date fields will be blank) Date Medicare IM given:   Date Additional Medicare IM given:    Discharge Disposition:    Per UR Regulation:  Reviewed for med. necessity/level of care/duration of stay  If discussed at [place] of Stay Meetings, dates discussed:    Comments:

## 2013-08-25 NOTE — Progress Notes (Signed)
INTERVENTIONAL NOTE  I note the surgical opinion. Hopefully the diverticulitis will cool. If not, and alternative revascularization needs to be considered, we could potentially stent the right coronary and LAD. He would end up with drug-eluting stents which would complicate the possibility of future surgery for diverticular disease should that become necessary. We'll continue to follow.

## 2013-08-25 NOTE — Progress Notes (Signed)
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 Echo has been performed and is pending. 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 He also has pan-diverticulitis, on unasyn. Abdominal pain is improved today. He is somewhat tremulous today - does report daily alcohol use.  Objective:  Temp:  [98 F (36.7 C)-99.3 F (37.4 C)] 98.1 F (36.7 C) (01/29 1100) Pulse Rate:  [49-70] 55 (01/29 1100) Resp:  [16-19] 16 (01/29 1100) BP: (88-118)/(49-64) 98/49 mmHg (01/29 1100) SpO2:  [93 %-97 %] 95 % (01/29 1100) Weight:  [202 lb 6.1 oz (91.8 kg)] 202 lb 6.1 oz (91.8 kg) (01/29 0500) Weight change: 10 lb 9.3 oz (4.8 kg)  Intake/Output from previous day: 01/28 0701 - 01/29 0700 In: 2114.2 [P.O.:240; I.V.:1574.2; IV Piggyback:300] Out: -   Intake/Output from this shift: Total I/O In: -  Out: 600 [Urine:600]  Medications: Current Facility-Administered Medications  Medication Dose Route Frequency Provider Last Rate Last Dose  . 0.9 %  sodium chloride infusion   Intravenous Continuous Belva Crome III, MD 20 mL/hr at 08/25/13 0000    . acetaminophen (TYLENOL) tablet 650 mg  650 mg Oral Q4H PRN Berle Mull, MD   650 mg at 08/25/13 0913  . Ampicillin-Sulbactam (UNASYN) 3 g in sodium chloride 0.9 % 100 mL IVPB  3 g Intravenous Q6H Berle Mull, MD   3 g at 08/25/13 0629  . aspirin EC tablet 325 mg  325 mg Oral Daily Berle Mull, MD   325 mg at 08/25/13 0913  . fluticasone (FLONASE) 50 MCG/ACT nasal spray 2 spray  2 spray Each Nare Daily Berle Mull, MD      . gabapentin (NEURONTIN) capsule 300 mg  300 mg Oral TID Berle Mull, MD   300 mg at 08/25/13 0913  . heparin ADULT infusion 100 units/mL (25000 units/250 mL)  1,400 Units/hr Intravenous Continuous Erline Hau, MD 14 mL/hr at 08/25/13 0530 1,400 Units/hr at 08/25/13 0530  . HYDROcodone-acetaminophen (NORCO/VICODIN) 5-325 MG per tablet 0.5-1 tablet  0.5-1 tablet Oral Q6H  PRN Berle Mull, MD   1 tablet at 08/25/13 0924  . metoprolol tartrate (LOPRESSOR) tablet 12.5 mg  12.5 mg Oral BID Berle Mull, MD   12.5 mg at 08/25/13 0913  . nitroGLYCERIN (NITROSTAT) SL tablet 0.4 mg  0.4 mg Sublingual Q5 Min x 3 PRN Berle Mull, MD      . nitroGLYCERIN 0.2 mg/mL in dextrose 5 % infusion  10 mcg/min Intravenous Continuous Belva Crome III, MD 3 mL/hr at 08/24/13 1423 10 mcg/min at 08/24/13 1423  . ondansetron (ZOFRAN) injection 4 mg  4 mg Intravenous Q6H PRN Berle Mull, MD      . simvastatin (ZOCOR) tablet 40 mg  40 mg Oral QHS Berle Mull, MD   40 mg at 08/24/13 2235  . zolpidem (AMBIEN) tablet 2.5 mg  2.5 mg Oral QHS PRN Berle Mull, MD   2.5 mg at 08/24/13 2236    Physical Exam: General appearance: alert, no distress and shaky Lungs: clear to auscultation bilaterally Heart: regular rate and rhythm, S1, S2 normal, no murmur, click, rub or gallop Abdomen: mild LLQ TTP - no rebound, guarding Extremities: extremities normal, atraumatic, no cyanosis or edema  Lab Results: Results for orders placed during the hospital encounter of 08/23/13 (from the past 48 hour(s))  CBC     Status: Abnormal   Collection Time    08/23/13  9:21 PM  Result Value Range   WBC 15.3 (*) 4.0 - 10.5 K/uL   RBC 5.61  4.22 - 5.81 MIL/uL   Hemoglobin 17.3 (*) 13.0 - 17.0 g/dL   HCT 47.6  39.0 - 52.0 %   MCV 84.8  78.0 - 100.0 fL   MCH 30.8  26.0 - 34.0 pg   MCHC 36.3 (*) 30.0 - 36.0 g/dL   RDW 14.1  11.5 - 15.5 %   Platelets 210  150 - 400 K/uL  BASIC METABOLIC PANEL     Status: Abnormal   Collection Time    08/23/13  9:21 PM      Result Value Range   Sodium 138  137 - 147 mEq/L   Potassium 3.5 (*) 3.7 - 5.3 mEq/L   Chloride 98  96 - 112 mEq/L   CO2 18 (*) 19 - 32 mEq/L   Glucose, Bld 158 (*) 70 - 99 mg/dL   BUN 12  6 - 23 mg/dL   Creatinine, Ser 1.33  0.50 - 1.35 mg/dL   Calcium 8.8  8.4 - 10.5 mg/dL   GFR calc non Af Amer 54 (*) >90 mL/min   GFR calc Af Amer 63 (*)  >90 mL/min   Comment: (NOTE)     The eGFR has been calculated using the CKD EPI equation.     This calculation has not been validated in all clinical situations.     eGFR's persistently <90 mL/min signify possible Chronic Kidney     Disease.  PRO B NATRIURETIC PEPTIDE     Status: None   Collection Time    08/23/13  9:21 PM      Result Value Range   Pro B Natriuretic peptide (BNP) 27.4  0 - 125 pg/mL  POCT I-STAT TROPONIN I     Status: None   Collection Time    08/23/13  9:27 PM      Result Value Range   Troponin i, poc 0.01  0.00 - 0.08 ng/mL   Comment 3            Comment: Due to the release kinetics of cTnI,     a negative result within the first hours     of the onset of symptoms does not rule out     myocardial infarction with certainty.     If myocardial infarction is still suspected,     repeat the test at appropriate intervals.  TROPONIN I     Status: Abnormal   Collection Time    08/24/13 12:38 AM      Result Value Range   Troponin I 0.97 (*) <0.30 ng/mL   Comment:            Due to the release kinetics of cTnI,     a negative result within the first hours     of the onset of symptoms does not rule out     myocardial infarction with certainty.     If myocardial infarction is still suspected,     repeat the test at appropriate intervals.     CRITICAL RESULT CALLED TO, READ BACK BY AND VERIFIED WITH:     CAMP, J. RN [phone] EBANKS COLCLOUGH, S  LACTIC ACID, PLASMA     Status: None   Collection Time    08/24/13  2:22 AM      Result Value Range   Lactic Acid, Venous 2.0  0.5 - 2.2 mmol/L  TROPONIN I     Status: Abnormal  Collection Time    08/24/13  2:35 AM      Result Value Range   Troponin I 1.19 (*) <0.30 ng/mL   Comment:            Due to the release kinetics of cTnI,     a negative result within the first hours     of the onset of symptoms does not rule out     myocardial infarction with certainty.     If myocardial infarction is still suspected,      repeat the test at appropriate intervals.     CRITICAL VALUE NOTED.  VALUE IS CONSISTENT WITH PREVIOUSLY REPORTED AND CALLED VALUE.  INFLUENZA PANEL BY PCR (TYPE A & B, H1N1)     Status: None   Collection Time    08/24/13  3:13 AM      Result Value Range   Influenza A By PCR NEGATIVE  NEGATIVE   Influenza B By PCR NEGATIVE  NEGATIVE   H1N1 flu by pcr NOT DETECTED  NOT DETECTED   Comment:            The Xpert Flu assay (FDA approved for     nasal aspirates or washes and     nasopharyngeal swab specimens), is     intended as an aid in the diagnosis of     influenza and should not be used as     a sole basis for treatment.  URINALYSIS, ROUTINE W REFLEX MICROSCOPIC     Status: None   Collection Time    08/24/13  3:34 AM      Result Value Range   Color, Urine YELLOW  YELLOW   APPearance CLEAR  CLEAR   Specific Gravity, Urine 1.021  1.005 - 1.030   pH 6.0  5.0 - 8.0   Glucose, UA NEGATIVE  NEGATIVE mg/dL   Hgb urine dipstick NEGATIVE  NEGATIVE   Bilirubin Urine NEGATIVE  NEGATIVE   Ketones, ur NEGATIVE  NEGATIVE mg/dL   Protein, ur NEGATIVE  NEGATIVE mg/dL   Urobilinogen, UA 0.2  0.0 - 1.0 mg/dL   Nitrite NEGATIVE  NEGATIVE   Leukocytes, UA NEGATIVE  NEGATIVE   Comment: MICROSCOPIC NOT DONE ON URINES WITH NEGATIVE PROTEIN, BLOOD, LEUKOCYTES, NITRITE, OR GLUCOSE <1000 mg/dL.  PROTIME-INR     Status: None   Collection Time    08/24/13  4:47 AM      Result Value Range   Prothrombin Time 14.7  11.6 - 15.2 seconds   INR 1.17  0.00 - 1.49  APTT     Status: Abnormal   Collection Time    08/24/13  4:47 AM      Result Value Range   aPTT 177 (*) 24 - 37 seconds   Comment:            IF BASELINE aPTT IS ELEVATED,     SUGGEST PATIENT RISK ASSESSMENT     BE USED TO DETERMINE APPROPRIATE     ANTICOAGULANT THERAPY.  CBC WITH DIFFERENTIAL     Status: Abnormal   Collection Time    08/24/13  4:47 AM      Result Value Range   WBC 11.2 (*) 4.0 - 10.5 K/uL   RBC 4.75  4.22 - 5.81 MIL/uL    Hemoglobin 14.0  13.0 - 17.0 g/dL   Comment: REPEATED TO VERIFY   HCT 39.9  39.0 - 52.0 %   MCV 84.0  78.0 - 100.0 fL   MCH 29.5  26.0 - 34.0 pg   MCHC 35.1  30.0 - 36.0 g/dL   RDW 14.4  11.5 - 15.5 %   Platelets 169  150 - 400 K/uL   Neutrophils Relative % 66  43 - 77 %   Neutro Abs 7.4  1.7 - 7.7 K/uL   Lymphocytes Relative 25  12 - 46 %   Lymphs Abs 2.8  0.7 - 4.0 K/uL   Monocytes Relative 7  3 - 12 %   Monocytes Absolute 0.8  0.1 - 1.0 K/uL   Eosinophils Relative 2  0 - 5 %   Eosinophils Absolute 0.2  0.0 - 0.7 K/uL   Basophils Relative 0  0 - 1 %   Basophils Absolute 0.0  0.0 - 0.1 K/uL  TSH     Status: None   Collection Time    08/24/13  4:47 AM      Result Value Range   TSH 1.126  0.350 - 4.500 uIU/mL   Comment: Performed at [facility]     Status: Abnormal   Collection Time    08/24/13  4:47 AM      Result Value Range   Sodium 140  137 - 147 mEq/L   Potassium 4.2  3.7 - 5.3 mEq/L   Chloride 106  96 - 112 mEq/L   CO2 19  19 - 32 mEq/L   Glucose, Bld 103 (*) 70 - 99 mg/dL   BUN 11  6 - 23 mg/dL   Creatinine, Ser 1.16  0.50 - 1.35 mg/dL   Calcium 8.4  8.4 - 10.5 mg/dL   Total Protein 6.5  6.0 - 8.3 g/dL   Albumin 3.4 (*) 3.5 - 5.2 g/dL   AST 22  0 - 37 U/L   ALT 14  0 - 53 U/L   Alkaline Phosphatase 60  39 - 117 U/L   Total Bilirubin 0.7  0.3 - 1.2 mg/dL   GFR calc non Af Amer 64 (*) >90 mL/min   GFR calc Af Amer 74 (*) >90 mL/min   Comment: (NOTE)     The eGFR has been calculated using the CKD EPI equation.     This calculation has not been validated in all clinical situations.     eGFR's persistently <90 mL/min signify possible Chronic Kidney     Disease.  MAGNESIUM     Status: None   Collection Time    08/24/13  4:47 AM      Result Value Range   Magnesium 2.1  1.5 - 2.5 mg/dL  HEMOGLOBIN A1C     Status: Abnormal   Collection Time    08/24/13  4:47 AM      Result Value Range   Hemoglobin A1C 6.0 (*) <5.7 %    Comment: (NOTE)                                                                               According to the ADA Clinical Practice Recommendations for 2011, when     HbA1c is used as a screening test:      >=6.5%   Diagnostic of Diabetes Mellitus               (  if abnormal result is confirmed)     5.7-6.4%   Increased risk of developing Diabetes Mellitus     References:Diagnosis and Classification of Diabetes Mellitus,Diabetes     UXYB,3383,29(VBTYO 1):S62-S69 and Standards of Medical Care in             Diabetes - 2011,Diabetes MAYO,4599,77 (Suppl 1):S11-S61.   Mean Plasma Glucose 126 (*) <117 mg/dL   Comment: Performed at [facility]: Abnormal   Collection Time    08/24/13  4:47 AM      Result Value Range   Cholesterol 119  0 - 200 mg/dL   Triglycerides 130  <150 mg/dL   HDL 24 (*) >39 mg/dL   Total CHOL/HDL Ratio 5.0     VLDL 26  0 - 40 mg/dL   LDL Cholesterol 69  0 - 99 mg/dL   Comment:            Total Cholesterol/HDL:CHD Risk     Coronary Heart Disease Risk Table                         Men   Women      1/2 Average Risk   3.4   3.3      Average Risk       5.0   4.4      2 X Average Risk   9.6   7.1      3 X Average Risk  23.4   11.0                Use the calculated Patient Ratio     above and the CHD Risk Table     to determine the patient's CHD Risk.                ATP III CLASSIFICATION (LDL):      <100     mg/dL   Optimal      100-129  mg/dL   Near or Above                        Optimal      130-159  mg/dL   Borderline      160-189  mg/dL   High      >190     mg/dL   Very High  TROPONIN I     Status: Abnormal   Collection Time    08/24/13  9:07 AM      Result Value Range   Troponin I 1.14 (*) <0.30 ng/mL   Comment:            Due to the release kinetics of cTnI,     a negative result within the first hours     of the onset of symptoms does not rule out     myocardial infarction with certainty.     If myocardial  infarction is still suspected,     repeat the test at appropriate intervals.     CRITICAL VALUE NOTED.  VALUE IS CONSISTENT WITH PREVIOUSLY REPORTED AND CALLED VALUE.  HEPARIN LEVEL (UNFRACTIONATED)     Status: Abnormal   Collection Time    08/24/13 11:30 AM      Result Value Range   Heparin Unfractionated 0.18 (*) 0.30 - 0.70 IU/mL   Comment:            IF HEPARIN RESULTS ARE BELOW  EXPECTED VALUES, AND PATIENT     DOSAGE HAS BEEN CONFIRMED,     SUGGEST FOLLOW UP TESTING     OF ANTITHROMBIN III LEVELS.  MRSA PCR SCREENING     Status: None   Collection Time    08/24/13  6:02 PM      Result Value Range   MRSA by PCR NEGATIVE  NEGATIVE   Comment:            The GeneXpert MRSA Assay (FDA     approved for NASAL specimens     only), is one component of a     comprehensive MRSA colonization     surveillance program. It is not     intended to diagnose MRSA     infection nor to guide or     monitor treatment for     MRSA infections.  TROPONIN I     Status: Abnormal   Collection Time    08/24/13 11:20 PM      Result Value Range   Troponin I 0.97 (*) <0.30 ng/mL   Comment:            Due to the release kinetics of cTnI,     a negative result within the first hours     of the onset of symptoms does not rule out     myocardial infarction with certainty.     If myocardial infarction is still suspected,     repeat the test at appropriate intervals.     CRITICAL VALUE NOTED.  VALUE IS CONSISTENT WITH PREVIOUSLY REPORTED AND CALLED VALUE.  COMPREHENSIVE METABOLIC PANEL     Status: Abnormal   Collection Time    08/25/13  3:04 AM      Result Value Range   Sodium 141  137 - 147 mEq/L   Potassium 4.5  3.7 - 5.3 mEq/L   Chloride 107  96 - 112 mEq/L   CO2 21  19 - 32 mEq/L   Glucose, Bld 95  70 - 99 mg/dL   BUN 14  6 - 23 mg/dL   Creatinine, Ser 1.17  0.50 - 1.35 mg/dL   Calcium 8.1 (*) 8.4 - 10.5 mg/dL   Total Protein 6.0  6.0 - 8.3 g/dL   Albumin 3.2 (*) 3.5 - 5.2 g/dL   AST 21   0 - 37 U/L   ALT 14  0 - 53 U/L   Alkaline Phosphatase 56  39 - 117 U/L   Total Bilirubin 0.5  0.3 - 1.2 mg/dL   GFR calc non Af Amer 63 (*) >90 mL/min   GFR calc Af Amer 73 (*) >90 mL/min   Comment: (NOTE)     The eGFR has been calculated using the CKD EPI equation.     This calculation has not been validated in all clinical situations.     eGFR's persistently <90 mL/min signify possible Chronic Kidney     Disease.  CBC     Status: Abnormal   Collection Time    08/25/13  3:04 AM      Result Value Range   WBC 8.6  4.0 - 10.5 K/uL   RBC 4.14 (*) 4.22 - 5.81 MIL/uL   Hemoglobin 12.2 (*) 13.0 - 17.0 g/dL   HCT 35.4 (*) 39.0 - 52.0 %   MCV 85.5  78.0 - 100.0 fL   MCH 29.5  26.0 - 34.0 pg   MCHC 34.5  30.0 - 36.0 g/dL   RDW 14.6  11.5 -  15.5 %   Platelets 136 (*) 150 - 400 K/uL  HEPARIN LEVEL (UNFRACTIONATED)     Status: Abnormal   Collection Time    08/25/13  3:04 AM      Result Value Range   Heparin Unfractionated <0.10 (*) 0.30 - 0.70 IU/mL   Comment:            IF HEPARIN RESULTS ARE BELOW     EXPECTED VALUES, AND PATIENT     DOSAGE HAS BEEN CONFIRMED,     SUGGEST FOLLOW UP TESTING     OF ANTITHROMBIN III LEVELS.    Imaging: Ct Angio Chest Pe W/cm &/or Wo Cm  08/24/2013   CLINICAL DATA:  Shortness of breath  EXAM: CT ANGIOGRAPHY CHEST WITH CONTRAST  TECHNIQUE: Multidetector CT imaging of the chest was performed using the standard protocol during bolus administration of intravenous contrast. Multiplanar CT image reconstructions including MIPs were obtained to evaluate the vascular anatomy.  CONTRAST:  130m OMNIPAQUE IOHEXOL 350 MG/ML SOLN  COMPARISON:  08/23/2013 radiograph  FINDINGS: The pulmonary arterial branch vessels are patent.  Normal caliber aorta with scattered atherosclerotic disease.  Upper normal to mildly enlarged heart size with left ventricular hypertrophy. Coronary artery and aortic valvular calcifications. Trace pericardial fluid. No pleural effusion. No  intrathoracic lymphadenopathy.  Upper abdominal images show a cyst arising from the right kidney anteriorly.  Lobular attenuation along the right margin of the trachea. Central airways are otherwise patent. Mild linear lung base opacities and mild lower lobe peribronchial thickening. Mild apical scarring.  No acute osseous finding.  Review of the MIP images confirms the above findings.  IMPRESSION: No pulmonary embolism.  Lobular attenuation along the right margin of the trachea is favored to reflect mucous/secretions. Attention on follow-up.  Mild lower lobe peribronchial thickening is nonspecific. Correlate clinically for mild bronchitis. Mild bibasilar opacities, favor atelectasis.  Heart size upper normal to mildly enlarged with left ventricular hypertrophy. Coronary artery calcifications.   Electronically Signed   By: ACarlos LeveringM.D.   On: 08/24/2013 00:34   Ct Abdomen Pelvis W Contrast  08/24/2013   CLINICAL DATA:  Abdominal pain  EXAM: CT ABDOMEN AND PELVIS WITH CONTRAST  TECHNIQUE: Multidetector CT imaging of the abdomen and pelvis was performed using the standard protocol following bolus administration of intravenous contrast.  CONTRAST:  1060mOMNIPAQUE IOHEXOL 350 MG/ML SOLN  COMPARISON:  None.  FINDINGS: See separate chest CT report. Small hiatal hernia and mild distal esophageal wall thickening.  Decreased attenuation of the liver is nonspecific post contrast however can be seen in the setting of fatty infiltration. No radiodense gallstones or biliary ductal dilatation. No appreciable abnormality of the pancreas, adrenal glands, spleen.  Left renal atrophy and fullness of the left renal pelvis. No hydroureter. Multiple bilateral renal cysts. Nonobstructing stone on the right. No hydroureteronephrosis on the right.  Colonic diverticulosis. Inflamed diverticulum along the proximal sigmoid colon with adjacent ill-defined fluid, fat stranding, and fascial thickening. No loculated fluid collection.  No free intraperitoneal air. No bowel obstruction. Appendix not identified. No right lower quadrant inflammation. Small duodenal diverticulum. No enlarged lymph nodes.  Moderately distended bladder with mild wall thickening. Small fat containing right inguinal hernia. Prostate gland measures 4.5 cm transverse diameter with questionable TURP defect. Partially imaged right hydrocele.  Scattered atherosclerotic disease of the aorta and branch vessels. Mild infrarenal ectasia up to 2.6 cm.  L5 pars defects. Grade 1 anterolisthesis of L5 and S1. L5-S1 degenerative disc disease.  IMPRESSION: Acute proximal  sigmoid colon diverticulitis. No free intraperitoneal air or loculated fluid collection to suggest abscess.  Small hiatal hernia and mild distal esophageal wall thickening, may reflect esophagitis or GERD.  Suspect hepatic steatosis.  Left renal atrophy and bilateral renal cysts. Nonobstructing interpolar/ lower pole right renal stone. Left renal pelvis fullness is likely chronic.  Moderately distended bladder with mild wall thickening. May reflect chronic partial outlet obstruction. Correlate with urinalysis to exclude cystitis.   Electronically Signed   By: Carlos Levering M.D.   On: 08/24/2013 00:41   Dg Chest Port 1 View  08/23/2013   CLINICAL DATA:  Short of breath.  Negative for chest pain.  EXAM: PORTABLE CHEST - 1 VIEW  COMPARISON:  11/14/2004.  FINDINGS: Cardiopericardial silhouette within normal limits. Mediastinal contours normal. Trachea midline. No airspace disease or effusion. Monitoring leads project over the chest. Mild basilar atelectasis.  IMPRESSION: No active disease.   Electronically Signed   By: Dereck Ligas M.D.   On: 08/23/2013 21:54    Assessment:  1. Principal Problem: 2.   NSTEMI (non-ST elevated myocardial infarction) 3. Active Problems: 4.   Diverticulitis 5.   CAD, multiple vessel 6.   Plan:  1. Cath yesterday showed multivessel CAD. Plan for possible CABG on Friday per  Dr. [name]. Issues is diverticulitis flare, which is being treated and is improving. Leukocytosis has resolved. Mild LLQ tenderness to palpation. He is shaky today - reports daily etoh use. Will start CIWA protocol.   Time Spent Directly with Patient:  15 minutes  Length of Stay:  LOS: 2 days   Pixie Casino, MD, [facility] Attending Cardiologist [facility]  Dhrithi Riche C 08/25/2013, 12:52 PM

## 2013-08-25 NOTE — Progress Notes (Signed)
[facility] for Heparin Indication: CAD awaiting CABG  Allergies  Allergen Reactions  . Codeine Anxiety  . Hydrocodone Anxiety    Currently taking hydrocodone (2.5-5mg ) and is okay.    Patient Measurements: Height: 5\' 11"  (180.3 cm) Weight: 191 lb 12.8 oz (87 kg) IBW/kg (Calculated) : 75.3  Vital Signs: Temp: 99.3 F (37.4 C) (01/28 2337) Temp src: Oral (01/28 2337) BP: 98/54 mmHg (01/29 0500) Pulse Rate: 52 (01/29 0500)  Labs:  Recent Labs  08/23/13 2121  08/24/13 0235 08/24/13 0447 08/24/13 0907 08/24/13 1130 08/24/13 2320 08/25/13 0304  HGB 17.3*  --   --  14.0  --   --   --  12.2*  HCT 47.6  --   --  39.9  --   --   --  35.4*  PLT 210  --   --  169  --   --   --  136*  APTT  --   --   --  177*  --   --   --   --   LABPROT  --   --   --  14.7  --   --   --   --   INR  --   --   --  1.17  --   --   --   --   HEPARINUNFRC  --   --   --   --   --  0.18*  --  <0.10*  CREATININE 1.33  --   --  1.16  --   --   --  1.17  TROPONINI  --   < > 1.19*  --  1.14*  --  0.97*  --   < > = values in this interval not displayed.  Estimated Creatinine Clearance: 66.1 ml/min (by C-G formula based on Cr of 1.17).  Assessment: 68 yo male with 3 vessel CAD awaiting CABG for heparin  Goal of Therapy:  Heparin level 0.3-0.7 units/ml Monitor platelets by anticoagulation protocol: Yes   Plan:  Increase Heparin  1400 units/hr Check heparin level in 8 hours.  Caryl Pina 08/25/2013,5:26 AM

## 2013-08-25 NOTE — Progress Notes (Addendum)
Pre-op Cardiac Surgery  Carotid Findings:  Bilateral:  1-39% ICA stenosis.  Vertebral artery flow is antegrade.  Moderate right ECA stenosis.     Upper Extremity Right Left  Brachial Pressures 129 128  Radial Waveforms Bi Tri  Ulnar Waveforms Tri Tri  Palmar Arch (Allen's Test) Normal  Decreased with radial compression, but not >50%. Normal with ulnar compression.   Palpable pedal pulses bilaterally.  Landry Mellow, RDMS, RVT 08/25/2013

## 2013-08-25 NOTE — Progress Notes (Signed)
Pt had slight chest discomfort this am on NTG. Will hold ambulation. Pt does st he has significant knee and back pain that has limited his duration of ambulation most recently. Very motivated. Discussed sternal precautions, mobility, IS. Gave OHS book and instructions on watching video. Wife and daughter present. Encouraged to get out of bed later today if able. Echo now. [place], ACSM 11:40 AM 08/25/2013

## 2013-08-26 ENCOUNTER — Inpatient Hospital Stay (HOSPITAL_COMMUNITY): Payer: Non-veteran care | Admitting: Anesthesiology

## 2013-08-26 ENCOUNTER — Encounter (HOSPITAL_COMMUNITY): Payer: Non-veteran care | Admitting: Anesthesiology

## 2013-08-26 ENCOUNTER — Encounter (HOSPITAL_COMMUNITY)
Admission: EM | Disposition: A | Discharge status: Home / Self Care | Payer: Non-veteran care | Source: Home / Self Care | Attending: Thoracic Surgery (Cardiothoracic Vascular Surgery)

## 2013-08-26 ENCOUNTER — Encounter (HOSPITAL_COMMUNITY): Payer: Self-pay | Admitting: Anesthesiology

## 2013-08-26 ENCOUNTER — Inpatient Hospital Stay (HOSPITAL_COMMUNITY): Payer: Non-veteran care

## 2013-08-26 DIAGNOSIS — I2510 Atherosclerotic heart disease of native coronary artery without angina pectoris: Secondary | ICD-10-CM

## 2013-08-26 DIAGNOSIS — Z951 Presence of aortocoronary bypass graft: Secondary | ICD-10-CM

## 2013-08-26 HISTORY — PX: INTRAOPERATIVE TRANSESOPHAGEAL ECHOCARDIOGRAM: SHX5062

## 2013-08-26 HISTORY — PX: CORONARY ARTERY BYPASS GRAFT: SHX141

## 2013-08-26 LAB — POCT I-STAT 4, (NA,K, GLUC, HGB,HCT)
GLUCOSE: 96 mg/dL (ref 70–99)
GLUCOSE: 105 mg/dL — AB (ref 70–99)
Glucose, Bld: 89 mg/dL (ref 70–99)
Glucose, Bld: 102 mg/dL — ABNORMAL HIGH (ref 70–99)
Glucose, Bld: 94 mg/dL (ref 70–99)
Glucose, Bld: 95 mg/dL (ref 70–99)
Glucose, Bld: 100 mg/dL — ABNORMAL HIGH (ref 70–99)
HCT: 33 % — ABNORMAL LOW (ref 39.0–52.0)
HCT: 32 % — ABNORMAL LOW (ref 39.0–52.0)
HCT: 26 % — ABNORMAL LOW (ref 39.0–52.0)
HCT: 26 % — ABNORMAL LOW (ref 39.0–52.0)
HCT: 26 % — ABNORMAL LOW (ref 39.0–52.0)
HCT: 28 % — ABNORMAL LOW (ref 39.0–52.0)
HEMATOCRIT: 32 % — AB (ref 39.0–52.0)
HEMOGLOBIN: 10.9 g/dL — AB (ref 13.0–17.0)
HEMOGLOBIN: 8.8 g/dL — AB (ref 13.0–17.0)
HEMOGLOBIN: 8.8 g/dL — AB (ref 13.0–17.0)
HEMOGLOBIN: 10.9 g/dL — AB (ref 13.0–17.0)
Hemoglobin: 11.2 g/dL — ABNORMAL LOW (ref 13.0–17.0)
Hemoglobin: 8.8 g/dL — ABNORMAL LOW (ref 13.0–17.0)
Hemoglobin: 9.5 g/dL — ABNORMAL LOW (ref 13.0–17.0)
POTASSIUM: 4 meq/L (ref 3.7–5.3)
POTASSIUM: 4 meq/L (ref 3.7–5.3)
Potassium: 4.3 mEq/L (ref 3.7–5.3)
Potassium: 6.6 mEq/L (ref 3.7–5.3)
Potassium: 7.2 mEq/L (ref 3.7–5.3)
Potassium: 6 mEq/L — ABNORMAL HIGH (ref 3.7–5.3)
Potassium: 4.3 mEq/L (ref 3.7–5.3)
Sodium: 144 mEq/L (ref 137–147)
Sodium: 143 mEq/L (ref 137–147)
Sodium: 142 mEq/L (ref 137–147)
Sodium: 139 mEq/L (ref 137–147)
Sodium: 140 mEq/L (ref 137–147)
Sodium: 141 mEq/L (ref 137–147)
Sodium: 142 mEq/L (ref 137–147)

## 2013-08-26 LAB — CBC
HCT: 35.1 % — ABNORMAL LOW (ref 39.0–52.0)
HCT: 32.2 % — ABNORMAL LOW (ref 39.0–52.0)
HCT: 32.7 % — ABNORMAL LOW (ref 39.0–52.0)
HEMOGLOBIN: 12.3 g/dL — AB (ref 13.0–17.0)
Hemoglobin: 11.4 g/dL — ABNORMAL LOW (ref 13.0–17.0)
Hemoglobin: 11.2 g/dL — ABNORMAL LOW (ref 13.0–17.0)
MCH: 29.8 pg (ref 26.0–34.0)
MCH: 29.9 pg (ref 26.0–34.0)
MCH: 29.5 pg (ref 26.0–34.0)
MCHC: 35 g/dL (ref 30.0–36.0)
MCHC: 35.4 g/dL (ref 30.0–36.0)
MCHC: 34.3 g/dL (ref 30.0–36.0)
MCV: 85 fL (ref 78.0–100.0)
MCV: 84.5 fL (ref 78.0–100.0)
MCV: 86.1 fL (ref 78.0–100.0)
PLATELETS: 87 10*3/uL — AB (ref 150–400)
PLATELETS: 93 10*3/uL — AB (ref 150–400)
Platelets: 127 10*3/uL — ABNORMAL LOW (ref 150–400)
RBC: 4.13 MIL/uL — AB (ref 4.22–5.81)
RBC: 3.81 MIL/uL — ABNORMAL LOW (ref 4.22–5.81)
RBC: 3.8 MIL/uL — AB (ref 4.22–5.81)
RDW: 14.5 % (ref 11.5–15.5)
RDW: 14.4 % (ref 11.5–15.5)
RDW: 14.6 % (ref 11.5–15.5)
WBC: 6.5 10*3/uL (ref 4.0–10.5)
WBC: 5.7 10*3/uL (ref 4.0–10.5)
WBC: 6.1 10*3/uL (ref 4.0–10.5)

## 2013-08-26 LAB — POCT I-STAT 3, ART BLOOD GAS (G3+)
ACID-BASE DEFICIT: 3 mmol/L — AB (ref 0.0–2.0)
Acid-base deficit: 3 mmol/L — ABNORMAL HIGH (ref 0.0–2.0)
Acid-base deficit: 4 mmol/L — ABNORMAL HIGH (ref 0.0–2.0)
BICARBONATE: 24.5 meq/L — AB (ref 20.0–24.0)
BICARBONATE: 24.1 meq/L — AB (ref 20.0–24.0)
Bicarbonate: 21.7 mEq/L (ref 20.0–24.0)
Bicarbonate: 22.7 mEq/L (ref 20.0–24.0)
Bicarbonate: 21.2 mEq/L (ref 20.0–24.0)
O2 SAT: 100 %
O2 SAT: 99 %
O2 SAT: 97 %
O2 Saturation: 96 %
O2 Saturation: 96 %
PCO2 ART: 36.4 mmHg (ref 35.0–45.0)
PCO2 ART: 41.6 mmHg (ref 35.0–45.0)
PCO2 ART: 41.3 mmHg (ref 35.0–45.0)
PH ART: 7.397 (ref 7.350–7.450)
PH ART: 7.429 (ref 7.350–7.450)
PH ART: 7.414 (ref 7.350–7.450)
PH ART: 7.348 — AB (ref 7.350–7.450)
PH ART: 7.322 — AB (ref 7.350–7.450)
PO2 ART: 96 mmHg (ref 80.0–100.0)
PO2 ART: 95 mmHg (ref 80.0–100.0)
Patient temperature: 37.4
Patient temperature: 37.7
TCO2: 26 mmol/L (ref 0–100)
TCO2: 25 mmol/L (ref 0–100)
TCO2: 23 mmol/L (ref 0–100)
TCO2: 24 mmol/L (ref 0–100)
TCO2: 22 mmol/L (ref 0–100)
pCO2 arterial: 39.8 mmHg (ref 35.0–45.0)
pCO2 arterial: 33.6 mmHg — ABNORMAL LOW (ref 35.0–45.0)
pO2, Arterial: 316 mmHg — ABNORMAL HIGH (ref 80.0–100.0)
pO2, Arterial: 151 mmHg — ABNORMAL HIGH (ref 80.0–100.0)
pO2, Arterial: 77 mmHg — ABNORMAL LOW (ref 80.0–100.0)

## 2013-08-26 LAB — BLOOD GAS, ARTERIAL
Acid-Base Excess: 0.1 mmol/L (ref 0.0–2.0)
Bicarbonate: 24.1 mEq/L — ABNORMAL HIGH (ref 20.0–24.0)
DRAWN BY: 39899
FIO2: 0.21 %
O2 SAT: 95.2 %
PCO2 ART: 38.6 mmHg (ref 35.0–45.0)
PO2 ART: 77.2 mmHg — AB (ref 80.0–100.0)
Patient temperature: 98.6
TCO2: 25.3 mmol/L (ref 0–100)
pH, Arterial: 7.412 (ref 7.350–7.450)

## 2013-08-26 LAB — POCT I-STAT, CHEM 8
BUN: 7 mg/dL (ref 6–23)
CHLORIDE: 111 meq/L (ref 96–112)
Calcium, Ion: 1.18 mmol/L (ref 1.13–1.30)
Creatinine, Ser: 1.1 mg/dL (ref 0.50–1.35)
GLUCOSE: 120 mg/dL — AB (ref 70–99)
HEMATOCRIT: 31 % — AB (ref 39.0–52.0)
HEMOGLOBIN: 10.5 g/dL — AB (ref 13.0–17.0)
POTASSIUM: 4.5 meq/L (ref 3.7–5.3)
Sodium: 142 mEq/L (ref 137–147)
TCO2: 21 mmol/L (ref 0–100)

## 2013-08-26 LAB — BASIC METABOLIC PANEL
BUN: 10 mg/dL (ref 6–23)
CHLORIDE: 109 meq/L (ref 96–112)
CO2: 22 mEq/L (ref 19–32)
CREATININE: 1.16 mg/dL (ref 0.50–1.35)
Calcium: 8.6 mg/dL (ref 8.4–10.5)
GFR calc Af Amer: 74 mL/min — ABNORMAL LOW (ref >90)
GFR calc non Af Amer: 64 mL/min — ABNORMAL LOW (ref >90)
GLUCOSE: 87 mg/dL (ref 70–99)
Potassium: 4.3 mEq/L (ref 3.7–5.3)
Sodium: 145 mEq/L (ref 137–147)

## 2013-08-26 LAB — CREATININE, SERUM
CREATININE: 1.13 mg/dL (ref 0.50–1.35)
GFR calc non Af Amer: 66 mL/min — ABNORMAL LOW (ref >90)
GFR, EST AFRICAN AMERICAN: 76 mL/min — AB (ref >90)

## 2013-08-26 LAB — GLUCOSE, CAPILLARY
GLUCOSE-CAPILLARY: 89 mg/dL (ref 70–99)
Glucose-Capillary: 91 mg/dL (ref 70–99)
Glucose-Capillary: 94 mg/dL (ref 70–99)
Glucose-Capillary: 118 mg/dL — ABNORMAL HIGH (ref 70–99)

## 2013-08-26 LAB — ABO/RH: ABO/RH(D): O NEG

## 2013-08-26 LAB — PLATELET COUNT: Platelets: 105 10*3/uL — ABNORMAL LOW (ref 150–400)

## 2013-08-26 LAB — HEMOGLOBIN AND HEMATOCRIT, BLOOD
HCT: 28 % — ABNORMAL LOW (ref 39.0–52.0)
HEMOGLOBIN: 9.7 g/dL — AB (ref 13.0–17.0)

## 2013-08-26 LAB — APTT: aPTT: 32 seconds (ref 24–37)

## 2013-08-26 LAB — MAGNESIUM: MAGNESIUM: 3.2 mg/dL — AB (ref 1.5–2.5)

## 2013-08-26 LAB — PROTIME-INR
INR: 1.29 (ref 0.00–1.49)
PROTHROMBIN TIME: 15.8 s — AB (ref 11.6–15.2)

## 2013-08-26 SURGERY — CORONARY ARTERY BYPASS GRAFTING (CABG)
Anesthesia: General | Site: Chest

## 2013-08-26 MED ORDER — PROTAMINE SULFATE 10 MG/ML IV SOLN
INTRAVENOUS | Status: AC
  Filled 2013-08-26: qty 25

## 2013-08-26 MED ORDER — PANTOPRAZOLE SODIUM 40 MG PO TBEC
40.0000 mg | DELAYED_RELEASE_TABLET | Freq: Every day | ORAL | Status: DC
  Administered 2013-08-28 – 2013-08-31 (×4): 40 mg via ORAL
  Filled 2013-08-26 (×4): qty 1

## 2013-08-26 MED ORDER — NITROGLYCERIN IN D5W 200-5 MCG/ML-% IV SOLN: 0.0000 ug/min | INTRAVENOUS | Status: DC

## 2013-08-26 MED ORDER — FENTANYL CITRATE 0.05 MG/ML IJ SOLN
INTRAMUSCULAR | Status: AC
  Filled 2013-08-26: qty 5

## 2013-08-26 MED ORDER — ONDANSETRON HCL 4 MG/2ML IJ SOLN
4.0000 mg | Freq: Four times a day (QID) | INTRAMUSCULAR | Status: DC | PRN
  Administered 2013-08-27 – 2013-08-31 (×8): 4 mg via INTRAVENOUS
  Filled 2013-08-26 (×8): qty 2

## 2013-08-26 MED ORDER — ARTIFICIAL TEARS OP OINT
TOPICAL_OINTMENT | OPHTHALMIC | Status: DC | PRN
  Administered 2013-08-26: 1 via OPHTHALMIC

## 2013-08-26 MED ORDER — VANCOMYCIN HCL IN DEXTROSE 1-5 GM/200ML-% IV SOLN
1000.0000 mg | Freq: Once | INTRAVENOUS | Status: AC
  Administered 2013-08-26: 1000 mg via INTRAVENOUS
  Filled 2013-08-26: qty 200

## 2013-08-26 MED ORDER — LACTATED RINGERS IV SOLN
INTRAVENOUS | Status: DC | PRN
  Administered 2013-08-26 (×2): via INTRAVENOUS

## 2013-08-26 MED ORDER — POTASSIUM CHLORIDE 10 MEQ/50ML IV SOLN: 10.0000 meq | INTRAVENOUS | Status: AC

## 2013-08-26 MED ORDER — INSULIN ASPART 100 UNIT/ML ~~LOC~~ SOLN
0.0000 [IU] | SUBCUTANEOUS | Status: DC
  Administered 2013-08-27 (×3): 2 [IU] via SUBCUTANEOUS

## 2013-08-26 MED ORDER — SODIUM CHLORIDE 0.9 % IJ SOLN
3.0000 mL | Freq: Two times a day (BID) | INTRAMUSCULAR | Status: DC
  Administered 2013-08-27 – 2013-08-29 (×5): 3 mL via INTRAVENOUS

## 2013-08-26 MED ORDER — PROPOFOL 10 MG/ML IV BOLUS
INTRAVENOUS | Status: AC
  Filled 2013-08-26: qty 20

## 2013-08-26 MED ORDER — SODIUM CHLORIDE 0.9 % IV SOLN
250.0000 mL | INTRAVENOUS | Status: DC
  Administered 2013-08-27: 250 mL via INTRAVENOUS

## 2013-08-26 MED ORDER — MIDAZOLAM HCL 2 MG/2ML IJ SOLN
INTRAMUSCULAR | Status: AC
  Filled 2013-08-26: qty 2

## 2013-08-26 MED ORDER — LACTATED RINGERS IV SOLN
INTRAVENOUS | Status: DC | PRN
  Administered 2013-08-26 (×2): via INTRAVENOUS

## 2013-08-26 MED ORDER — SODIUM CHLORIDE 0.9 % IV SOLN
INTRAVENOUS | Status: DC
  Administered 2013-08-26: 13:00:00 via INTRAVENOUS
  Administered 2013-08-28: 20 mL/h via INTRAVENOUS

## 2013-08-26 MED ORDER — PROPOFOL 10 MG/ML IV BOLUS
INTRAVENOUS | Status: DC | PRN
  Administered 2013-08-26: 110 mg via INTRAVENOUS

## 2013-08-26 MED ORDER — MIDAZOLAM HCL 10 MG/2ML IJ SOLN
INTRAMUSCULAR | Status: AC
  Filled 2013-08-26: qty 2

## 2013-08-26 MED ORDER — DEXMEDETOMIDINE HCL IN NACL 200 MCG/50ML IV SOLN: 0.1000 ug/kg/h | INTRAVENOUS | Status: DC

## 2013-08-26 MED ORDER — SODIUM CHLORIDE 0.9 % IV SOLN
INTRAVENOUS | Status: DC
  Filled 2013-08-26: qty 1

## 2013-08-26 MED ORDER — MIDAZOLAM HCL 5 MG/5ML IJ SOLN
INTRAMUSCULAR | Status: DC | PRN
  Administered 2013-08-26 (×6): 2 mg via INTRAVENOUS
  Administered 2013-08-26 (×2): 3 mg via INTRAVENOUS
  Administered 2013-08-26 (×2): 2 mg via INTRAVENOUS

## 2013-08-26 MED ORDER — ESMOLOL HCL 10 MG/ML IV SOLN
INTRAVENOUS | Status: AC
  Filled 2013-08-26: qty 10

## 2013-08-26 MED ORDER — VECURONIUM BROMIDE 10 MG IV SOLR
INTRAVENOUS | Status: AC
  Filled 2013-08-26: qty 10

## 2013-08-26 MED ORDER — SUCCINYLCHOLINE CHLORIDE 20 MG/ML IJ SOLN
INTRAMUSCULAR | Status: AC
  Filled 2013-08-26: qty 1

## 2013-08-26 MED ORDER — ARTIFICIAL TEARS OP OINT
TOPICAL_OINTMENT | OPHTHALMIC | Status: AC
  Filled 2013-08-26: qty 3.5

## 2013-08-26 MED ORDER — SODIUM CHLORIDE 0.45 % IV SOLN
INTRAVENOUS | Status: DC
  Administered 2013-08-26: 13:00:00 via INTRAVENOUS
  Administered 2013-08-29: 75 mL/h via INTRAVENOUS

## 2013-08-26 MED ORDER — ASPIRIN 81 MG PO CHEW: 324.0000 mg | CHEWABLE_TABLET | Freq: Every day | ORAL | Status: DC

## 2013-08-26 MED ORDER — MIDAZOLAM HCL 2 MG/2ML IJ SOLN: 2.0000 mg | INTRAMUSCULAR | Status: DC | PRN

## 2013-08-26 MED ORDER — ACETAMINOPHEN 160 MG/5ML PO SOLN: 1000.0000 mg | Freq: Four times a day (QID) | ORAL | Status: DC

## 2013-08-26 MED ORDER — ROCURONIUM BROMIDE 50 MG/5ML IV SOLN
INTRAVENOUS | Status: AC
  Filled 2013-08-26: qty 1

## 2013-08-26 MED ORDER — ACETAMINOPHEN 160 MG/5ML PO SOLN: 650.0000 mg | Freq: Once | ORAL | Status: AC

## 2013-08-26 MED ORDER — FENTANYL CITRATE 0.05 MG/ML IJ SOLN
INTRAMUSCULAR | Status: AC
  Filled 2013-08-26: qty 2

## 2013-08-26 MED ORDER — GLYCOPYRROLATE 0.2 MG/ML IJ SOLN
INTRAMUSCULAR | Status: AC
  Filled 2013-08-26: qty 1

## 2013-08-26 MED ORDER — DOCUSATE SODIUM 100 MG PO CAPS
200.0000 mg | ORAL_CAPSULE | Freq: Every day | ORAL | Status: DC
  Administered 2013-08-27 – 2013-08-28 (×2): 200 mg via ORAL
  Filled 2013-08-26 (×3): qty 2

## 2013-08-26 MED ORDER — FENTANYL CITRATE 0.05 MG/ML IJ SOLN
INTRAMUSCULAR | Status: DC | PRN
  Administered 2013-08-26: 250 ug via INTRAVENOUS
  Administered 2013-08-26: 200 ug via INTRAVENOUS
  Administered 2013-08-26: 250 ug via INTRAVENOUS
  Administered 2013-08-26: 50 ug via INTRAVENOUS
  Administered 2013-08-26: 150 ug via INTRAVENOUS
  Administered 2013-08-26: 250 ug via INTRAVENOUS
  Administered 2013-08-26: 100 ug via INTRAVENOUS
  Administered 2013-08-26 (×2): 250 ug via INTRAVENOUS
  Administered 2013-08-26: 100 ug via INTRAVENOUS
  Administered 2013-08-26 (×2): 250 ug via INTRAVENOUS

## 2013-08-26 MED ORDER — ACETAMINOPHEN 500 MG PO TABS
1000.0000 mg | ORAL_TABLET | Freq: Four times a day (QID) | ORAL | Status: DC
  Administered 2013-08-27 – 2013-08-29 (×9): 1000 mg via ORAL
  Filled 2013-08-26 (×13): qty 2

## 2013-08-26 MED ORDER — METOPROLOL TARTRATE 1 MG/ML IV SOLN: 2.5000 mg | INTRAVENOUS | Status: DC | PRN

## 2013-08-26 MED ORDER — ROCURONIUM BROMIDE 100 MG/10ML IV SOLN
INTRAVENOUS | Status: DC | PRN
  Administered 2013-08-26: 50 mg via INTRAVENOUS

## 2013-08-26 MED ORDER — PHENYLEPHRINE 40 MCG/ML (10ML) SYRINGE FOR IV PUSH (FOR BLOOD PRESSURE SUPPORT)
PREFILLED_SYRINGE | INTRAVENOUS | Status: AC
  Filled 2013-08-26: qty 10

## 2013-08-26 MED ORDER — EPHEDRINE SULFATE 50 MG/ML IJ SOLN
INTRAMUSCULAR | Status: AC
  Filled 2013-08-26: qty 1

## 2013-08-26 MED ORDER — FAMOTIDINE IN NACL 20-0.9 MG/50ML-% IV SOLN
20.0000 mg | Freq: Two times a day (BID) | INTRAVENOUS | Status: DC
  Administered 2013-08-26: 20 mg via INTRAVENOUS

## 2013-08-26 MED ORDER — MAGNESIUM SULFATE 40 MG/ML IJ SOLN
4.0000 g | Freq: Once | INTRAMUSCULAR | Status: AC
  Administered 2013-08-26: 4 g via INTRAVENOUS
  Filled 2013-08-26: qty 100

## 2013-08-26 MED ORDER — VECURONIUM BROMIDE 10 MG IV SOLR
INTRAVENOUS | Status: DC | PRN
  Administered 2013-08-26 (×2): 10 mg via INTRAVENOUS
  Administered 2013-08-26: 5 mg via INTRAVENOUS

## 2013-08-26 MED ORDER — DEXTROSE 5 % IV SOLN
1.5000 g | Freq: Two times a day (BID) | INTRAVENOUS | Status: AC
  Administered 2013-08-26 – 2013-08-28 (×4): 1.5 g via INTRAVENOUS
  Filled 2013-08-26 (×4): qty 1.5

## 2013-08-26 MED ORDER — SODIUM CHLORIDE 0.9 % IJ SOLN
OROMUCOSAL | Status: DC | PRN
  Administered 2013-08-26 (×3): via TOPICAL

## 2013-08-26 MED ORDER — MORPHINE SULFATE 2 MG/ML IJ SOLN: 1.0000 mg | INTRAMUSCULAR | Status: AC | PRN

## 2013-08-26 MED ORDER — OXYCODONE HCL 5 MG PO TABS
5.0000 mg | ORAL_TABLET | ORAL | Status: DC | PRN
  Administered 2013-08-27: 5 mg via ORAL
  Administered 2013-08-27 (×5): 10 mg via ORAL
  Administered 2013-08-28: 5 mg via ORAL
  Filled 2013-08-26 (×4): qty 2
  Filled 2013-08-26 (×2): qty 1
  Filled 2013-08-26: qty 2

## 2013-08-26 MED ORDER — LACTATED RINGERS IV SOLN
INTRAVENOUS | Status: DC
  Administered 2013-08-26: 13:00:00 via INTRAVENOUS

## 2013-08-26 MED ORDER — PROTAMINE SULFATE 10 MG/ML IV SOLN
INTRAVENOUS | Status: DC | PRN
  Administered 2013-08-26 (×5): 50 mg via INTRAVENOUS

## 2013-08-26 MED ORDER — PROTAMINE SULFATE 10 MG/ML IV SOLN
INTRAVENOUS | Status: AC
  Filled 2013-08-26: qty 5

## 2013-08-26 MED ORDER — LACTATED RINGERS IV SOLN
INTRAVENOUS | Status: DC | PRN
  Administered 2013-08-26: 07:00:00 via INTRAVENOUS

## 2013-08-26 MED ORDER — METOPROLOL TARTRATE 25 MG/10 ML ORAL SUSPENSION
12.5000 mg | Freq: Two times a day (BID) | ORAL | Status: DC
  Filled 2013-08-26 (×7): qty 5

## 2013-08-26 MED ORDER — PHENYLEPHRINE HCL 10 MG/ML IJ SOLN
0.0000 ug/min | INTRAVENOUS | Status: DC
  Filled 2013-08-26: qty 2

## 2013-08-26 MED ORDER — MORPHINE SULFATE 2 MG/ML IJ SOLN
2.0000 mg | INTRAMUSCULAR | Status: DC | PRN
  Administered 2013-08-26 – 2013-08-27 (×4): 2 mg via INTRAVENOUS
  Administered 2013-08-27: 4 mg via INTRAVENOUS
  Administered 2013-08-27 (×3): 2 mg via INTRAVENOUS
  Administered 2013-08-27: 4 mg via INTRAVENOUS
  Administered 2013-08-27: 2 mg via INTRAVENOUS
  Filled 2013-08-26 (×6): qty 1
  Filled 2013-08-26: qty 2
  Filled 2013-08-26 (×2): qty 1
  Filled 2013-08-26: qty 2

## 2013-08-26 MED ORDER — HEMOSTATIC AGENTS (NO CHARGE) OPTIME
TOPICAL | Status: DC | PRN
  Administered 2013-08-26: 1 via TOPICAL

## 2013-08-26 MED ORDER — HEPARIN SODIUM (PORCINE) 1000 UNIT/ML IJ SOLN
INTRAMUSCULAR | Status: AC
  Filled 2013-08-26: qty 1

## 2013-08-26 MED ORDER — ACETAMINOPHEN 650 MG RE SUPP
650.0000 mg | Freq: Once | RECTAL | Status: AC
  Administered 2013-08-26: 650 mg via RECTAL

## 2013-08-26 MED ORDER — INSULIN REGULAR BOLUS VIA INFUSION
0.0000 [IU] | Freq: Three times a day (TID) | INTRAVENOUS | Status: DC
  Filled 2013-08-26: qty 10

## 2013-08-26 MED ORDER — METOPROLOL TARTRATE 12.5 MG HALF TABLET
12.5000 mg | ORAL_TABLET | Freq: Two times a day (BID) | ORAL | Status: DC
  Administered 2013-08-27 – 2013-08-29 (×3): 12.5 mg via ORAL
  Filled 2013-08-26 (×7): qty 1

## 2013-08-26 MED ORDER — STERILE WATER FOR INJECTION IJ SOLN
INTRAMUSCULAR | Status: AC
  Filled 2013-08-26: qty 10

## 2013-08-26 MED ORDER — HEPARIN SODIUM (PORCINE) 1000 UNIT/ML IJ SOLN
INTRAMUSCULAR | Status: DC | PRN
  Administered 2013-08-26: 30000 [IU] via INTRAVENOUS

## 2013-08-26 MED ORDER — GLYCOPYRROLATE 0.2 MG/ML IJ SOLN
INTRAMUSCULAR | Status: DC | PRN
  Administered 2013-08-26: 0.1 mg via INTRAVENOUS
  Administered 2013-08-26: 0.2 mg via INTRAVENOUS
  Administered 2013-08-26: 0.1 mg via INTRAVENOUS

## 2013-08-26 MED ORDER — SODIUM CHLORIDE 0.9 % IJ SOLN
3.0000 mL | INTRAMUSCULAR | Status: DC | PRN
Start: 2013-08-27 — End: 2013-08-29

## 2013-08-26 MED ORDER — BISACODYL 5 MG PO TBEC
10.0000 mg | DELAYED_RELEASE_TABLET | Freq: Every day | ORAL | Status: DC
  Administered 2013-08-27 – 2013-08-28 (×2): 10 mg via ORAL
  Filled 2013-08-26 (×2): qty 2

## 2013-08-26 MED ORDER — ALBUMIN HUMAN 5 % IV SOLN
250.0000 mL | INTRAVENOUS | Status: AC | PRN
  Administered 2013-08-26 (×4): 250 mL via INTRAVENOUS
  Filled 2013-08-26 (×2): qty 250

## 2013-08-26 MED ORDER — LACTATED RINGERS IV SOLN: 500.0000 mL | Freq: Once | INTRAVENOUS | Status: AC | PRN

## 2013-08-26 MED ORDER — BISACODYL 10 MG RE SUPP: 10.0000 mg | Freq: Every day | RECTAL | Status: DC

## 2013-08-26 MED ORDER — ASPIRIN EC 325 MG PO TBEC
325.0000 mg | DELAYED_RELEASE_TABLET | Freq: Every day | ORAL | Status: DC
  Administered 2013-08-27 – 2013-08-31 (×5): 325 mg via ORAL
  Filled 2013-08-26 (×5): qty 1

## 2013-08-26 SURGICAL SUPPLY — 99 items
APL SKNCLS STERI-STRIP NONHPOA (GAUZE/BANDAGES/DRESSINGS) ×2
ATTRACTOMAT 16X20 MAGNETIC DRP (DRAPES) ×4 IMPLANT
BAG DECANTER FOR FLEXI CONT (MISCELLANEOUS) ×4 IMPLANT
BANDAGE ELASTIC 4 VELCRO ST LF (GAUZE/BANDAGES/DRESSINGS) ×4 IMPLANT
BANDAGE ELASTIC 6 VELCRO ST LF (GAUZE/BANDAGES/DRESSINGS) ×4 IMPLANT
BANDAGE GAUZE ELAST BULKY 4 IN (GAUZE/BANDAGES/DRESSINGS) ×4 IMPLANT
BASKET HEART  (ORDER IN 25'S) (MISCELLANEOUS) ×1
BASKET HEART (ORDER IN 25'S) (MISCELLANEOUS) ×1
BASKET HEART (ORDER IN 25S) (MISCELLANEOUS) ×2 IMPLANT
BENZOIN TINCTURE PRP APPL 2/3 (GAUZE/BANDAGES/DRESSINGS) ×2 IMPLANT
BLADE STERNUM SYSTEM 6 (BLADE) ×4 IMPLANT
BLADE SURG 11 STRL SS (BLADE) ×2 IMPLANT
BNDG GAUZE ELAST 4 BULKY (GAUZE/BANDAGES/DRESSINGS) ×2 IMPLANT
CANISTER SUCTION 2500CC (MISCELLANEOUS) ×4 IMPLANT
CANNULA EZ GLIDE AORTIC 21FR (CANNULA) ×4 IMPLANT
CARDIAC SUCTION (MISCELLANEOUS) ×4 IMPLANT
CATH CPB KIT HENDRICKSON (MISCELLANEOUS) ×4 IMPLANT
CATH ROBINSON RED A/P 18FR (CATHETERS) ×4 IMPLANT
CATH THORACIC 36FR (CATHETERS) ×4 IMPLANT
CATH THORACIC 36FR RT ANG (CATHETERS) ×4 IMPLANT
CLIP TI MEDIUM 24 (CLIP) IMPLANT
CLIP TI WIDE RED SMALL 24 (CLIP) ×4 IMPLANT
CLOSURE WOUND 1/2 X4 (GAUZE/BANDAGES/DRESSINGS) ×1
COVER SURGICAL LIGHT HANDLE (MISCELLANEOUS) ×4 IMPLANT
CRADLE DONUT ADULT HEAD (MISCELLANEOUS) ×4 IMPLANT
DRAPE CARDIOVASCULAR INCISE (DRAPES) ×4
DRAPE INCISE IOBAN 66X45 STRL (DRAPES) ×2 IMPLANT
DRAPE SLUSH/WARMER DISC (DRAPES) ×4 IMPLANT
DRAPE SRG 135X102X78XABS (DRAPES) ×2 IMPLANT
DRSG COVADERM 4X14 (GAUZE/BANDAGES/DRESSINGS) ×4 IMPLANT
ELECT REM PT RETURN 9FT ADLT (ELECTROSURGICAL) ×8
ELECTRODE REM PT RTRN 9FT ADLT (ELECTROSURGICAL) ×4 IMPLANT
GLOVE BIO SURGEON STRL SZ 6 (GLOVE) ×6 IMPLANT
GLOVE BIO SURGEON STRL SZ 6.5 (GLOVE) ×3 IMPLANT
GLOVE BIO SURGEONS STRL SZ 6.5 (GLOVE) ×3
GLOVE BIOGEL PI IND STRL 6 (GLOVE) IMPLANT
GLOVE BIOGEL PI IND STRL 6.5 (GLOVE) IMPLANT
GLOVE BIOGEL PI IND STRL 7.0 (GLOVE) IMPLANT
GLOVE BIOGEL PI INDICATOR 6 (GLOVE) ×4
GLOVE BIOGEL PI INDICATOR 6.5 (GLOVE) ×2
GLOVE BIOGEL PI INDICATOR 7.0 (GLOVE) ×2
GLOVE EUDERMIC 7 POWDERFREE (GLOVE) ×10 IMPLANT
GLOVE SURG SS PI 6.5 STRL IVOR (GLOVE) ×2 IMPLANT
GOWN PREVENTION PLUS XLARGE (GOWN DISPOSABLE) ×8 IMPLANT
GOWN STRL NON-REIN LRG LVL3 (GOWN DISPOSABLE) ×24 IMPLANT
HEMOSTAT POWDER SURGIFOAM 1G (HEMOSTASIS) ×12 IMPLANT
HEMOSTAT SURGICEL 2X14 (HEMOSTASIS) ×4 IMPLANT
INSERT FOGARTY XLG (MISCELLANEOUS) IMPLANT
KIT BASIN OR (CUSTOM PROCEDURE TRAY) ×4 IMPLANT
KIT ROOM TURNOVER OR (KITS) ×4 IMPLANT
KIT SUCTION CATH 14FR (SUCTIONS) ×8 IMPLANT
KIT VASOVIEW W/TROCAR VH 2000 (KITS) ×4 IMPLANT
MARKER GRAFT CORONARY BYPASS (MISCELLANEOUS) ×12 IMPLANT
NS IRRIG 1000ML POUR BTL (IV SOLUTION) ×22 IMPLANT
PACK OPEN HEART (CUSTOM PROCEDURE TRAY) ×4 IMPLANT
PAD ARMBOARD 7.5X6 YLW CONV (MISCELLANEOUS) ×8 IMPLANT
PAD ELECT DEFIB RADIOL ZOLL (MISCELLANEOUS) ×4 IMPLANT
PENCIL BUTTON HOLSTER BLD 10FT (ELECTRODE) ×4 IMPLANT
PUNCH AORTIC ROTATE 4.0MM (MISCELLANEOUS) IMPLANT
PUNCH AORTIC ROTATE 4.5MM 8IN (MISCELLANEOUS) IMPLANT
PUNCH AORTIC ROTATE 5MM 8IN (MISCELLANEOUS) IMPLANT
SET CARDIOPLEGIA MPS 5001102 (MISCELLANEOUS) ×2 IMPLANT
SPONGE GAUZE 4X4 12PLY (GAUZE/BANDAGES/DRESSINGS) ×8 IMPLANT
SPONGE GAUZE 4X4 12PLY STER LF (GAUZE/BANDAGES/DRESSINGS) ×4 IMPLANT
SPONGE LAP 4X18 X RAY DECT (DISPOSABLE) ×2 IMPLANT
STRIP CLOSURE SKIN 1/2X4 (GAUZE/BANDAGES/DRESSINGS) ×1 IMPLANT
SUT BONE WAX W31G (SUTURE) ×4 IMPLANT
SUT MNCRL AB 4-0 PS2 18 (SUTURE) ×2 IMPLANT
SUT PROLENE 3 0 SH DA (SUTURE) ×4 IMPLANT
SUT PROLENE 4 0 RB 1 (SUTURE)
SUT PROLENE 4 0 SH DA (SUTURE) IMPLANT
SUT PROLENE 4-0 RB1 .5 CRCL 36 (SUTURE) IMPLANT
SUT PROLENE 6 0 C 1 30 (SUTURE) ×10 IMPLANT
SUT PROLENE 7 0 BV1 MDA (SUTURE) ×8 IMPLANT
SUT PROLENE 8 0 BV175 6 (SUTURE) IMPLANT
SUT STEEL 6MS V (SUTURE) ×4 IMPLANT
SUT STEEL STERNAL CCS#1 18IN (SUTURE) IMPLANT
SUT STEEL SZ 6 DBL 3X14 BALL (SUTURE) ×4 IMPLANT
SUT VIC AB 1 CTX 36 (SUTURE) ×8
SUT VIC AB 1 CTX36XBRD ANBCTR (SUTURE) ×4 IMPLANT
SUT VIC AB 2-0 CT1 27 (SUTURE)
SUT VIC AB 2-0 CT1 36 (SUTURE) ×2 IMPLANT
SUT VIC AB 2-0 CT1 TAPERPNT 27 (SUTURE) IMPLANT
SUT VIC AB 2-0 CTX 27 (SUTURE) IMPLANT
SUT VIC AB 3-0 SH 27 (SUTURE)
SUT VIC AB 3-0 SH 27X BRD (SUTURE) IMPLANT
SUT VIC AB 3-0 X1 27 (SUTURE) IMPLANT
SUT VICRYL 4-0 PS2 18IN ABS (SUTURE) IMPLANT
SUTURE E-PAK OPEN HEART (SUTURE) ×4 IMPLANT
SYSTEM SAHARA CHEST DRAIN ATS (WOUND CARE) ×4 IMPLANT
TAPE CLOTH SURG 4X10 WHT LF (GAUZE/BANDAGES/DRESSINGS) ×4 IMPLANT
TOWEL OR 17X24 6PK STRL BLUE (TOWEL DISPOSABLE) ×8 IMPLANT
TOWEL OR 17X26 10 PK STRL BLUE (TOWEL DISPOSABLE) ×8 IMPLANT
TRAY FOLEY IC TEMP SENS 14FR (CATHETERS) ×4 IMPLANT
TUBE FEEDING 8FR 16IN STR KANG (MISCELLANEOUS) ×4 IMPLANT
TUBING INSUFFLATION (TUBING) ×2 IMPLANT
TUBING INSUFFLATION 10FT LAP (TUBING) ×4 IMPLANT
UNDERPAD 30X30 INCONTINENT (UNDERPADS AND DIAPERS) ×4 IMPLANT
WATER STERILE IRR 1000ML POUR (IV SOLUTION) ×8 IMPLANT

## 2013-08-26 NOTE — Anesthesia Preprocedure Evaluation (Signed)
Anesthesia Evaluation  Patient identified by MRN, date of birth, ID band Patient awake    Reviewed: Allergy & Precautions, H&P , NPO status , Patient's Chart, lab work & pertinent test results  Airway Mallampati: II  Neck ROM: full    Dental   Pulmonary Current Smoker,          Cardiovascular + CAD and + Past MI     Neuro/Psych    GI/Hepatic   Endo/Other    Renal/GU      Musculoskeletal   Abdominal   Peds  Hematology   Anesthesia Other Findings   Reproductive/Obstetrics                           Anesthesia Physical Anesthesia Plan  ASA: III  Anesthesia Plan: General   Post-op Pain Management:    Induction: Intravenous  Airway Management Planned: Oral ETT  Additional Equipment: Arterial line, CVP, PA Cath, Ultrasound Guidance Line Placement and TEE  Intra-op Plan:   Post-operative Plan: Post-operative intubation/ventilation  Informed Consent: I have reviewed the patients History and Physical, chart, labs and discussed the procedure including the risks, benefits and alternatives for the proposed anesthesia with the patient or authorized representative who has indicated his/her understanding and acceptance.     Plan Discussed with: CRNA, Anesthesiologist and Surgeon  Anesthesia Plan Comments:         Anesthesia Quick Evaluation

## 2013-08-26 NOTE — Preoperative (Signed)
Beta Blockers   Reason not to administer Beta Blockers:Hold  beta blocker due to Bradycardia (HR less than 50 bpm) 

## 2013-08-26 NOTE — Interval H&P Note (Signed)
History and Physical Interval Note:  08/26/2013 7:26 AM  Adrian [name]  has presented today for surgery, with the diagnosis of CAD  The various methods of treatment have been discussed with the patient and family. After consideration of risks, benefits and other options for treatment, the patient has consented to  Procedure(s): CORONARY ARTERY BYPASS GRAFTING (CABG) (N/A) INTRAOPERATIVE TRANSESOPHAGEAL ECHOCARDIOGRAM (N/A) as a surgical intervention .  The patient's history has been reviewed, patient examined, no change in status, stable for surgery.  I have reviewed the patient's chart and labs.  Questions were answered to the patient's satisfaction.     [name] C

## 2013-08-26 NOTE — Procedures (Signed)
Extubation Procedure Note  Patient Details:   Name: ARATH KAIGLER DOB: Mar 20, 1946 MRN: [id number]   Pt extubated to [facility] after successful SICU wean protocol.  Evaluation  O2 sats: stable throughout Complications: No apparent complications Patient did tolerate procedure well. Bilateral Breath Sounds: Clear   Yes  Tyshika Baldridge Apple 08/26/2013, 5:43 PM

## 2013-08-26 NOTE — Progress Notes (Signed)
When speaking with Adrian [name] prior to surgery this morning, he was upset with being placed on a detox protocol.  He says he has had a tremor since he was in the [employer]. He says that he has one drink a night after work but no more than one ounce of alcohol per day.  Will not use detox protocol postop as there is no evidence of alcohol withdrawl

## 2013-08-26 NOTE — Transfer of Care (Signed)
Immediate Anesthesia Transfer of Care Note  Patient: ELZIA HOTT  Procedure(s) Performed: Procedure(s): Coronary Artery Bypass Graft times four using left internal mammary artery and right leg greater saphenous vein harvested endoscopically (N/A) INTRAOPERATIVE TRANSESOPHAGEAL ECHOCARDIOGRAM (N/A)  Patient Location: SICU  Anesthesia Type:General  Level of Consciousness: sedated and Patient remains intubated per anesthesia plan  Airway & Oxygen Therapy: Patient remains intubated per anesthesia plan and Patient placed on Ventilator (see vital sign flow sheet for setting)  Post-op Assessment: Report given to PACU RN and Post -op Vital signs reviewed and stable  Post vital signs: Reviewed and stable  Complications: No apparent anesthesia complications

## 2013-08-26 NOTE — Progress Notes (Signed)
Patient for CABG today. Will be transferred to CVTS service. Once able to take PO would change antibiotics to cipro/flagyl to complete a total of 14 days of therapy for his acute diverticulitis. Will sign off. Please call us back with questions.  Domingo Mend, MD [facility] Pager: [phone]

## 2013-08-26 NOTE — Anesthesia Procedure Notes (Signed)
Procedure Name: Intubation Date/Time: 08/26/2013 7:52 AM Performed by: Erik Obey Pre-anesthesia Checklist: Patient identified, Timeout performed, Emergency Drugs available, Suction available and Patient being monitored Patient Re-evaluated:Patient Re-evaluated prior to inductionOxygen Delivery Method: Circle system utilized Preoxygenation: Pre-oxygenation with 100% oxygen Intubation Type: IV induction Ventilation: Mask ventilation without difficulty Laryngoscope Size: Mac and 3 Grade View: Grade I Tube type: Oral Tube size: 8.0 mm Number of attempts: 1 Airway Equipment and Method: Stylet Placement Confirmation: ETT inserted through vocal cords under direct vision,  positive ETCO2 and breath sounds checked- equal and bilateral Secured at: 22 cm Tube secured with: Tape Dental Injury: Teeth and Oropharynx as per pre-operative assessment

## 2013-08-26 NOTE — Anesthesia Postprocedure Evaluation (Signed)
  Anesthesia Post-op Note  Patient: Adrian [name]  Procedure(s) Performed: Procedure(s): Coronary Artery Bypass Graft times four using left internal mammary artery and right leg greater saphenous vein harvested endoscopically (N/A) INTRAOPERATIVE TRANSESOPHAGEAL ECHOCARDIOGRAM (N/A)  Patient Location: ICU  Anesthesia Type:General  Level of Consciousness: sedated  Airway and Oxygen Therapy: Patient remains intubated per anesthesia plan  Post-op Pain: none  Post-op Assessment: Post-op Vital signs reviewed, Patient's Cardiovascular Status Stable and Respiratory Function Stable  Post-op Vital Signs: Reviewed and stable  Complications: No apparent anesthesia complications

## 2013-08-26 NOTE — Brief Op Note (Addendum)
08/23/2013 - 08/26/2013  10:58 AM  PATIENT:  [name]  68 y.o. male  PRE-OPERATIVE DIAGNOSIS:  Coronary artery disease  POST-OPERATIVE DIAGNOSIS:  Coronary artery disease  PROCEDURE:   CORONARY ARTERY BYPASS GRAFTING x 4 (LIMA-LAD, SVG-OM, SVG-AM-dRCA) ENDOSCOPIC VEIN HARVEST RIGHT THIGH  SURGEON:  [name], MD  ASSISTANT: Suzzanne Cloud, PA-C  ANESTHESIA:   general  PATIENT CONDITION:  ICU - intubated and hemodynamically stable.  PRE-OPERATIVE WEIGHT: 88 kg  Good quality targets and conduits XC= 71 min CPB= 101 min

## 2013-08-26 NOTE — Procedures (Signed)
Extubation Procedure Note  Patient Details:   Name: Adrian [name] DOB: Feb 09, 1946 MRN: [id number]   Pt extubated to [facility] after successful SICU wean protocol.  NIF -28/ FVC 1.2L with moderate effort   Evaluation  O2 sats: stable throughout Complications: No apparent complications Patient did tolerate procedure well. Bilateral Breath Sounds: Clear   Yes  Adrian [name] 08/26/2013, 5:45 PM

## 2013-08-26 NOTE — Progress Notes (Signed)
  Echocardiogram Echocardiogram Transesophageal (OR) has been performed.  [place], [place] 08/26/2013, 8:55 AM

## 2013-08-26 NOTE — Progress Notes (Signed)
Patient ID: Adrian [name], male   DOB: [birth year], 68 y.o.   MRN: [id number] EVENING ROUNDS NOTE :     [address]       [place] [address]             [phone]                 Day of Surgery Procedure(s) (LRB): Coronary Artery Bypass Graft times four using left internal mammary artery and right leg greater saphenous vein harvested endoscopically (N/A) INTRAOPERATIVE TRANSESOPHAGEAL ECHOCARDIOGRAM (N/A)  Total Length of Stay:  LOS: 3 days  BP 110/67  Pulse 90  Temp(Src) 99.1 F (37.3 C) (Core (Comment))  Resp 31  Ht 5\' 11"  (1.803 m)  Wt 194 lb 10.7 oz (88.3 kg)  BMI 27.16 kg/m2  SpO2 92%  .Intake/Output     01/29 0701 - 01/30 0700 01/30 0701 - 01/31 0700   P.O. 200    I.V. (mL/kg) 389 (4.4) 2851.4 (32.3)   Blood  459   IV Piggyback 400 1050   Total Intake(mL/kg) 989 (11.2) 4360.4 (49.4)   Urine (mL/kg/hr) 3550 (1.7) 4200 (4.2)   Stool 1 (0)    Blood  800 (0.8)   Chest Tube  150 (0.1)   Total Output 3551 5150   Net -2562 -789.6          . sodium chloride 20 mL/hr at 08/26/13 1700  . sodium chloride 20 mL/hr at 08/26/13 1700  . [START ON 08/27/2013] sodium chloride    . dexmedetomidine Stopped (08/26/13 1620)  . lactated ringers 20 mL/hr at 08/26/13 1700  . nitroGLYCERIN Stopped (08/26/13 1240)  . phenylephrine (NEO-SYNEPHRINE) Adult infusion Stopped (08/26/13 1240)     Lab Results  Component Value Date   WBC 5.7 08/26/2013   HGB 11.4* 08/26/2013   HCT 32.2* 08/26/2013   PLT 87* 08/26/2013   GLUCOSE 105* 08/26/2013   CHOL 119 08/24/2013   TRIG 130 08/24/2013   HDL 24* 08/24/2013   LDLCALC 69 08/24/2013   ALT 14 08/25/2013   AST 21 08/25/2013   NA 142 08/26/2013   K 4.3 08/26/2013   CL 109 08/26/2013   CREATININE 1.16 08/26/2013   BUN 10 08/26/2013   CO2 22 08/26/2013   TSH 1.126 08/24/2013   INR 1.29 08/26/2013   HGBA1C 6.0* 08/24/2013   Post op today Now extubated Not bleeding Thrombocytopenia   Grace Isaac MD  Beeper  [phone] Office [phone] 08/26/2013 6:25 PM

## 2013-08-26 NOTE — H&P (View-Only) (Signed)
Reason for Consult:3 vesssel CAD post NQWMI Referring Physician: Dr. [name] is an 68 y.o. male.  HPI: 68 yo man with no prior history of CAD. He was in his usual state of health until the day prior to admission. He developed left lower quadrant pain. He has a history of diverticulitis and recognized the symptoms. He called his MD and a prescription for an antibiotic was called into a pharmacy. When he went to get the antibiotic he became acutely ill and short of breath. He did not have any significant chest pain, pressure or tightness, but a tremendous sense of impending doom. He had generalized weakness.   EMS was called. He was tachypneic and tachycardic. He was brought to the ED. A CT showed acute diverticulitis if the sigmoid colon, but no evidence of PE. He had positive enzymes.  He was admitted and started on antibiotics. Today he had cardiac catheterization. 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  He currently denies CP or SOB, but does have some mild LLQ discomfort.  Past Medical History  Diagnosis Date  . Diverticulitis   . Hyperlipidemia     Past Surgical History  Procedure Laterality Date  . Knee surgery      No family history on file.  Social History:  reports that he has been smoking.  He does not have any smokeless tobacco history on file. He reports that he drinks alcohol. He reports that he does not use illicit drugs. ? Allergies:  Allergies  Allergen Reactions  . Codeine Anxiety  . Hydrocodone Anxiety    Currently taking hydrocodone (2.5-39m) and is okay.    Medications:  Prior to Admission:  Prescriptions prior to admission  Medication Sig Dispense Refill  . acetaminophen (TYLENOL) 325 MG tablet Take 650 mg by mouth every 6 (six) hours as needed for mild pain.      .Marland Kitchenaspirin 81 MG chewable tablet Chew 81 mg by mouth daily.      . fluticasone (FLONASE) 50 MCG/ACT nasal spray Place 2 sprays into both nostrils daily.      .[name] gabapentin (NEURONTIN) 300 MG capsule Take 300 mg by mouth 3 (three) times daily.      .Marland KitchenHYDROcodone-acetaminophen (NORCO/VICODIN) 5-325 MG per tablet Take 0.5-1 tablets by mouth every 6 (six) hours as needed for moderate pain or severe pain.      . simvastatin (ZOCOR) 40 MG tablet Take 1 tablet (40 mg total) by mouth at bedtime.  30 tablet  6  . zolpidem (AMBIEN) 5 MG tablet Take 2.5 mg by mouth at bedtime as needed for sleep.        Results for orders placed during the hospital encounter of 08/23/13 (from the past 48 hour(s))  CBC     Status: Abnormal   Collection Time    08/23/13  9:21 PM      Result Value Range   WBC 15.3 (*) 4.0 - 10.5 K/uL   RBC 5.61  4.22 - 5.81 MIL/uL   Hemoglobin 17.3 (*) 13.0 - 17.0 g/dL   HCT 47.6  39.0 - 52.0 %   MCV 84.8  78.0 - 100.0 fL   MCH 30.8  26.0 - 34.0 pg   MCHC 36.3 (*) 30.0 - 36.0 g/dL   RDW 14.1  11.5 - 15.5 %   Platelets 210  150 - 400 K/uL  BASIC METABOLIC PANEL     Status: Abnormal   Collection Time  08/23/13  9:21 PM      Result Value Range   Sodium 138  137 - 147 mEq/L   Potassium 3.5 (*) 3.7 - 5.3 mEq/L   Chloride 98  96 - 112 mEq/L   CO2 18 (*) 19 - 32 mEq/L   Glucose, Bld 158 (*) 70 - 99 mg/dL   BUN 12  6 - 23 mg/dL   Creatinine, Ser 1.33  0.50 - 1.35 mg/dL   Calcium 8.8  8.4 - 10.5 mg/dL   GFR calc non Af Amer 54 (*) >90 mL/min   GFR calc Af Amer 63 (*) >90 mL/min   Comment: (NOTE)     The eGFR has been calculated using the CKD EPI equation.     This calculation has not been validated in all clinical situations.     eGFR's persistently <90 mL/min signify possible Chronic Kidney     Disease.  PRO B NATRIURETIC PEPTIDE     Status: None   Collection Time    08/23/13  9:21 PM      Result Value Range   Pro B Natriuretic peptide (BNP) 27.4  0 - 125 pg/mL  POCT I-STAT TROPONIN I     Status: None   Collection Time    08/23/13  9:27 PM      Result Value Range   Troponin i, poc 0.01  0.00 - 0.08 ng/mL   Comment 3             Comment: Due to the release kinetics of cTnI,     a negative result within the first hours     of the onset of symptoms does not rule out     myocardial infarction with certainty.     If myocardial infarction is still suspected,     repeat the test at appropriate intervals.  TROPONIN I     Status: Abnormal   Collection Time    08/24/13 12:38 AM      Result Value Range   Troponin I 0.97 (*) <0.30 ng/mL   Comment:            Due to the release kinetics of cTnI,     a negative result within the first hours     of the onset of symptoms does not rule out     myocardial infarction with certainty.     If myocardial infarction is still suspected,     repeat the test at appropriate intervals.     CRITICAL RESULT CALLED TO, READ BACK BY AND VERIFIED WITH:     CAMP, J. RN [phone] EBANKS COLCLOUGH, S  LACTIC ACID, PLASMA     Status: None   Collection Time    08/24/13  2:22 AM      Result Value Range   Lactic Acid, Venous 2.0  0.5 - 2.2 mmol/L  TROPONIN I     Status: Abnormal   Collection Time    08/24/13  2:35 AM      Result Value Range   Troponin I 1.19 (*) <0.30 ng/mL   Comment:            Due to the release kinetics of cTnI,     a negative result within the first hours     of the onset of symptoms does not rule out     myocardial infarction with certainty.     If myocardial infarction is still suspected,     repeat the test at appropriate intervals.  CRITICAL VALUE NOTED.  VALUE IS CONSISTENT WITH PREVIOUSLY REPORTED AND CALLED VALUE.  INFLUENZA PANEL BY PCR (TYPE A & B, H1N1)     Status: None   Collection Time    08/24/13  3:13 AM      Result Value Range   Influenza A By PCR NEGATIVE  NEGATIVE   Influenza B By PCR NEGATIVE  NEGATIVE   H1N1 flu by pcr NOT DETECTED  NOT DETECTED   Comment:            The Xpert Flu assay (FDA approved for     nasal aspirates or washes and     nasopharyngeal swab specimens), is     intended as an aid in the diagnosis of     influenza and  should not be used as     a sole basis for treatment.  URINALYSIS, ROUTINE W REFLEX MICROSCOPIC     Status: None   Collection Time    08/24/13  3:34 AM      Result Value Range   Color, Urine YELLOW  YELLOW   APPearance CLEAR  CLEAR   Specific Gravity, Urine 1.021  1.005 - 1.030   pH 6.0  5.0 - 8.0   Glucose, UA NEGATIVE  NEGATIVE mg/dL   Hgb urine dipstick NEGATIVE  NEGATIVE   Bilirubin Urine NEGATIVE  NEGATIVE   Ketones, ur NEGATIVE  NEGATIVE mg/dL   Protein, ur NEGATIVE  NEGATIVE mg/dL   Urobilinogen, UA 0.2  0.0 - 1.0 mg/dL   Nitrite NEGATIVE  NEGATIVE   Leukocytes, UA NEGATIVE  NEGATIVE   Comment: MICROSCOPIC NOT DONE ON URINES WITH NEGATIVE PROTEIN, BLOOD, LEUKOCYTES, NITRITE, OR GLUCOSE <1000 mg/dL.  PROTIME-INR     Status: None   Collection Time    08/24/13  4:47 AM      Result Value Range   Prothrombin Time 14.7  11.6 - 15.2 seconds   INR 1.17  0.00 - 1.49  APTT     Status: Abnormal   Collection Time    08/24/13  4:47 AM      Result Value Range   aPTT 177 (*) 24 - 37 seconds   Comment:            IF BASELINE aPTT IS ELEVATED,     SUGGEST PATIENT RISK ASSESSMENT     BE USED TO DETERMINE APPROPRIATE     ANTICOAGULANT THERAPY.  CBC WITH DIFFERENTIAL     Status: Abnormal   Collection Time    08/24/13  4:47 AM      Result Value Range   WBC 11.2 (*) 4.0 - 10.5 K/uL   RBC 4.75  4.22 - 5.81 MIL/uL   Hemoglobin 14.0  13.0 - 17.0 g/dL   Comment: REPEATED TO VERIFY   HCT 39.9  39.0 - 52.0 %   MCV 84.0  78.0 - 100.0 fL   MCH 29.5  26.0 - 34.0 pg   MCHC 35.1  30.0 - 36.0 g/dL   RDW 14.4  11.5 - 15.5 %   Platelets 169  150 - 400 K/uL   Neutrophils Relative % 66  43 - 77 %   Neutro Abs 7.4  1.7 - 7.7 K/uL   Lymphocytes Relative 25  12 - 46 %   Lymphs Abs 2.8  0.7 - 4.0 K/uL   Monocytes Relative 7  3 - 12 %   Monocytes Absolute 0.8  0.1 - 1.0 K/uL   Eosinophils Relative 2  0 - 5 %  Eosinophils Absolute 0.2  0.0 - 0.7 K/uL   Basophils Relative 0  0 - 1 %   Basophils  Absolute 0.0  0.0 - 0.1 K/uL  TSH     Status: None   Collection Time    08/24/13  4:47 AM      Result Value Range   TSH 1.126  0.350 - 4.500 uIU/mL   Comment: Performed at [facility]     Status: Abnormal   Collection Time    08/24/13  4:47 AM      Result Value Range   Sodium 140  137 - 147 mEq/L   Potassium 4.2  3.7 - 5.3 mEq/L   Chloride 106  96 - 112 mEq/L   CO2 19  19 - 32 mEq/L   Glucose, Bld 103 (*) 70 - 99 mg/dL   BUN 11  6 - 23 mg/dL   Creatinine, Ser 1.16  0.50 - 1.35 mg/dL   Calcium 8.4  8.4 - 10.5 mg/dL   Total Protein 6.5  6.0 - 8.3 g/dL   Albumin 3.4 (*) 3.5 - 5.2 g/dL   AST 22  0 - 37 U/L   ALT 14  0 - 53 U/L   Alkaline Phosphatase 60  39 - 117 U/L   Total Bilirubin 0.7  0.3 - 1.2 mg/dL   GFR calc non Af Amer 64 (*) >90 mL/min   GFR calc Af Amer 74 (*) >90 mL/min   Comment: (NOTE)     The eGFR has been calculated using the CKD EPI equation.     This calculation has not been validated in all clinical situations.     eGFR's persistently <90 mL/min signify possible Chronic Kidney     Disease.  MAGNESIUM     Status: None   Collection Time    08/24/13  4:47 AM      Result Value Range   Magnesium 2.1  1.5 - 2.5 mg/dL  HEMOGLOBIN A1C     Status: Abnormal   Collection Time    08/24/13  4:47 AM      Result Value Range   Hemoglobin A1C 6.0 (*) <5.7 %   Comment: (NOTE)                                                                               According to the ADA Clinical Practice Recommendations for 2011, when     HbA1c is used as a screening test:      >=6.5%   Diagnostic of Diabetes Mellitus               (if abnormal result is confirmed)     5.7-6.4%   Increased risk of developing Diabetes Mellitus     References:Diagnosis and Classification of Diabetes Mellitus,Diabetes     JSRP,5945,85(FYTWK 1):S62-S69 and Standards of Medical Care in             Diabetes - 2011,Diabetes Care,2011,34 (Suppl 1):S11-S61.   Mean  Plasma Glucose 126 (*) <117 mg/dL   Comment: Performed at [facility]: Abnormal   Collection Time    08/24/13  4:47 AM  Result Value Range   Cholesterol 119  0 - 200 mg/dL   Triglycerides 130  <150 mg/dL   HDL 24 (*) >39 mg/dL   Total CHOL/HDL Ratio 5.0     VLDL 26  0 - 40 mg/dL   LDL Cholesterol 69  0 - 99 mg/dL   Comment:            Total Cholesterol/HDL:CHD Risk     Coronary Heart Disease Risk Table                         Men   Women      1/2 Average Risk   3.4   3.3      Average Risk       5.0   4.4      2 X Average Risk   9.6   7.1      3 X Average Risk  23.4   11.0                Use the calculated Patient Ratio     above and the CHD Risk Table     to determine the patient's CHD Risk.                ATP III CLASSIFICATION (LDL):      <100     mg/dL   Optimal      100-129  mg/dL   Near or Above                        Optimal      130-159  mg/dL   Borderline      160-189  mg/dL   High      >190     mg/dL   Very High  TROPONIN I     Status: Abnormal   Collection Time    08/24/13  9:07 AM      Result Value Range   Troponin I 1.14 (*) <0.30 ng/mL   Comment:            Due to the release kinetics of cTnI,     a negative result within the first hours     of the onset of symptoms does not rule out     myocardial infarction with certainty.     If myocardial infarction is still suspected,     repeat the test at appropriate intervals.     CRITICAL VALUE NOTED.  VALUE IS CONSISTENT WITH PREVIOUSLY REPORTED AND CALLED VALUE.  HEPARIN LEVEL (UNFRACTIONATED)     Status: Abnormal   Collection Time    08/24/13 11:30 AM      Result Value Range   Heparin Unfractionated 0.18 (*) 0.30 - 0.70 IU/mL   Comment:            IF HEPARIN RESULTS ARE BELOW     EXPECTED VALUES, AND PATIENT     DOSAGE HAS BEEN CONFIRMED,     SUGGEST FOLLOW UP TESTING     OF ANTITHROMBIN III LEVELS.    Ct Angio Chest Pe W/cm &/or Wo Cm  08/24/2013   CLINICAL DATA:   Shortness of breath  EXAM: CT ANGIOGRAPHY CHEST WITH CONTRAST  TECHNIQUE: Multidetector CT imaging of the chest was performed using the standard protocol during bolus administration of intravenous contrast. Multiplanar CT image reconstructions including MIPs were obtained to evaluate the vascular anatomy.  CONTRAST:  172m OMNIPAQUE IOHEXOL 350 MG/ML SOLN  COMPARISON:  08/23/2013 radiograph  FINDINGS: The pulmonary arterial branch vessels are patent.  Normal caliber aorta with scattered atherosclerotic disease.  Upper normal to mildly enlarged heart size with left ventricular hypertrophy. Coronary artery and aortic valvular calcifications. Trace pericardial fluid. No pleural effusion. No intrathoracic lymphadenopathy.  Upper abdominal images show a cyst arising from the right kidney anteriorly.  Lobular attenuation along the right margin of the trachea. Central airways are otherwise patent. Mild linear lung base opacities and mild lower lobe peribronchial thickening. Mild apical scarring.  No acute osseous finding.  Review of the MIP images confirms the above findings.  IMPRESSION: No pulmonary embolism.  Lobular attenuation along the right margin of the trachea is favored to reflect mucous/secretions. Attention on follow-up.  Mild lower lobe peribronchial thickening is nonspecific. Correlate clinically for mild bronchitis. Mild bibasilar opacities, favor atelectasis.  Heart size upper normal to mildly enlarged with left ventricular hypertrophy. Coronary artery calcifications.   Electronically Signed   By: Adrian [name] M.D.   On: 08/24/2013 00:34   Ct Abdomen Pelvis W Contrast  08/24/2013   CLINICAL DATA:  Abdominal pain  EXAM: CT ABDOMEN AND PELVIS WITH CONTRAST  TECHNIQUE: Multidetector CT imaging of the abdomen and pelvis was performed using the standard protocol following bolus administration of intravenous contrast.  CONTRAST:  15m OMNIPAQUE IOHEXOL 350 MG/ML SOLN  COMPARISON:  None.  FINDINGS: See  separate chest CT report. Small hiatal hernia and mild distal esophageal wall thickening.  Decreased attenuation of the liver is nonspecific post contrast however can be seen in the setting of fatty infiltration. No radiodense gallstones or biliary ductal dilatation. No appreciable abnormality of the pancreas, adrenal glands, spleen.  Left renal atrophy and fullness of the left renal pelvis. No hydroureter. Multiple bilateral renal cysts. Nonobstructing stone on the right. No hydroureteronephrosis on the right.  Colonic diverticulosis. Inflamed diverticulum along the proximal sigmoid colon with adjacent ill-defined fluid, fat stranding, and fascial thickening. No loculated fluid collection. No free intraperitoneal air. No bowel obstruction. Appendix not identified. No right lower quadrant inflammation. Small duodenal diverticulum. No enlarged lymph nodes.  Moderately distended bladder with mild wall thickening. Small fat containing right inguinal hernia. Prostate gland measures 4.5 cm transverse diameter with questionable TURP defect. Partially imaged right hydrocele.  Scattered atherosclerotic disease of the aorta and branch vessels. Mild infrarenal ectasia up to 2.6 cm.  L5 pars defects. Grade 1 anterolisthesis of L5 and S1. L5-S1 degenerative disc disease.  IMPRESSION: Acute proximal sigmoid colon diverticulitis. No free intraperitoneal air or loculated fluid collection to suggest abscess.  Small hiatal hernia and mild distal esophageal wall thickening, may reflect esophagitis or GERD.  Suspect hepatic steatosis.  Left renal atrophy and bilateral renal cysts. Nonobstructing interpolar/ lower pole right renal stone. Left renal pelvis fullness is likely chronic.  Moderately distended bladder with mild wall thickening. May reflect chronic partial outlet obstruction. Correlate with urinalysis to exclude cystitis.   Electronically Signed   By: ACarlos LeveringM.D.   On: 08/24/2013 00:41   Dg Chest Port 1  View  08/23/2013   CLINICAL DATA:  Short of breath.  Negative for chest pain.  EXAM: PORTABLE CHEST - 1 VIEW  COMPARISON:  11/14/2004.  FINDINGS: Cardiopericardial silhouette within normal limits. Mediastinal contours normal. Trachea midline. No airspace disease or effusion. Monitoring leads project over the chest. Mild basilar atelectasis.  IMPRESSION: No active disease.   Electronically Signed   By: GDereck LigasM.D.   On: 08/23/2013 21:54  Review of Systems  Constitutional: Negative for fever and chills.  Respiratory: Positive for shortness of breath.   Cardiovascular: Negative for chest pain.  Gastrointestinal: Positive for abdominal pain and constipation.  Musculoskeletal: Positive for joint pain.  Neurological: Positive for weakness.  All other systems reviewed and are negative.   Blood pressure 109/59, pulse 54, temperature 98.1 F (36.7 [name]), temperature source Oral, resp. rate 17, height '5\' 11"'  (1.803 m), weight 191 lb 12.8 oz (87 kg), SpO2 94.00%. Physical Exam  Vitals reviewed. Constitutional: He is oriented to person, place, and time. He appears well-developed and well-nourished. No distress.  HENT:  Head: Normocephalic and atraumatic.  Eyes: EOM are normal. Pupils are equal, round, and reactive to light.  Neck: Neck supple. No thyromegaly present.  No carotid bruits  Cardiovascular: Normal rate, regular rhythm, normal heart sounds and intact distal pulses.   No murmur heard. Respiratory: Effort normal. He has no wheezes. He has no rales.  GI: Soft. There is tenderness (LLQ tenderness, no rebound).  Musculoskeletal: He exhibits no edema.  Lymphadenopathy:    He has no cervical adenopathy.  Neurological: He is alert and oriented to person, place, and time. No cranial nerve deficit.  No focal deficits  Skin: Skin is warm and dry.      CARDIAC CATHETERIZATION IMPRESSIONS: 1. Severe multivessel coronary disease involving the LAD (proximal 99%) and right coronary (mid  99%) most severely. There is moderately severe second obtuse marginal disease obstructing up to 75%.  2. Normal left ventricular systolic function  Assessment/Plan: 68 yo man with severe vessel CAD and a NQWMI in the setting of acute diverticulitis.  This is a difficult situation. CABG is the best option for revascularization, but his risk is markedly elevated due to the diverticulitis. The best case scenario would be if the diverticulitis "ccols off" over the next 24 to 48 hours with IV antibiotics. If so, we could potentially proceed with CABG on Friday. If the diverticulitis does not improve rapidly may need to consider alternative revascularization.  I am going to put him on the schedule for Friday, but will reevaluate him tomorrow before making a final decision.  Adrian [name],Adrian [name] 08/24/2013, 8:45 PM

## 2013-08-27 ENCOUNTER — Inpatient Hospital Stay (HOSPITAL_COMMUNITY): Payer: Non-veteran care

## 2013-08-27 LAB — CBC
HCT: 30.5 % — ABNORMAL LOW (ref 39.0–52.0)
HEMATOCRIT: 31.6 % — AB (ref 39.0–52.0)
Hemoglobin: 10.9 g/dL — ABNORMAL LOW (ref 13.0–17.0)
Hemoglobin: 10.4 g/dL — ABNORMAL LOW (ref 13.0–17.0)
MCH: 29.5 pg (ref 26.0–34.0)
MCH: 29.5 pg (ref 26.0–34.0)
MCHC: 34.5 g/dL (ref 30.0–36.0)
MCHC: 34.1 g/dL (ref 30.0–36.0)
MCV: 85.6 fL (ref 78.0–100.0)
MCV: 86.4 fL (ref 78.0–100.0)
PLATELETS: 135 10*3/uL — AB (ref 150–400)
Platelets: 114 10*3/uL — ABNORMAL LOW (ref 150–400)
RBC: 3.69 MIL/uL — AB (ref 4.22–5.81)
RBC: 3.53 MIL/uL — ABNORMAL LOW (ref 4.22–5.81)
RDW: 14.5 % (ref 11.5–15.5)
RDW: 14.9 % (ref 11.5–15.5)
WBC: 11.4 10*3/uL — AB (ref 4.0–10.5)
WBC: 10.3 10*3/uL (ref 4.0–10.5)

## 2013-08-27 LAB — GLUCOSE, CAPILLARY
GLUCOSE-CAPILLARY: 120 mg/dL — AB (ref 70–99)
GLUCOSE-CAPILLARY: 135 mg/dL — AB (ref 70–99)
GLUCOSE-CAPILLARY: 139 mg/dL — AB (ref 70–99)
Glucose-Capillary: 113 mg/dL — ABNORMAL HIGH (ref 70–99)
Glucose-Capillary: 115 mg/dL — ABNORMAL HIGH (ref 70–99)
Glucose-Capillary: 124 mg/dL — ABNORMAL HIGH (ref 70–99)
Glucose-Capillary: 134 mg/dL — ABNORMAL HIGH (ref 70–99)

## 2013-08-27 LAB — BASIC METABOLIC PANEL
BUN: 11 mg/dL (ref 6–23)
CHLORIDE: 108 meq/L (ref 96–112)
CO2: 20 meq/L (ref 19–32)
Calcium: 7.8 mg/dL — ABNORMAL LOW (ref 8.4–10.5)
Creatinine, Ser: 1.24 mg/dL (ref 0.50–1.35)
GFR calc Af Amer: 68 mL/min — ABNORMAL LOW (ref >90)
GFR calc non Af Amer: 59 mL/min — ABNORMAL LOW (ref >90)
GLUCOSE: 132 mg/dL — AB (ref 70–99)
Potassium: 4.5 mEq/L (ref 3.7–5.3)
Sodium: 142 mEq/L (ref 137–147)

## 2013-08-27 LAB — POCT I-STAT, CHEM 8
BUN: 14 mg/dL (ref 6–23)
CHLORIDE: 104 meq/L (ref 96–112)
CREATININE: 1.4 mg/dL — AB (ref 0.50–1.35)
Calcium, Ion: 1.15 mmol/L (ref 1.13–1.30)
Glucose, Bld: 121 mg/dL — ABNORMAL HIGH (ref 70–99)
HCT: 30 % — ABNORMAL LOW (ref 39.0–52.0)
Hemoglobin: 10.2 g/dL — ABNORMAL LOW (ref 13.0–17.0)
Potassium: 4.1 mEq/L (ref 3.7–5.3)
Sodium: 139 mEq/L (ref 137–147)
TCO2: 22 mmol/L (ref 0–100)

## 2013-08-27 LAB — MAGNESIUM
MAGNESIUM: 2.6 mg/dL — AB (ref 1.5–2.5)
Magnesium: 2.6 mg/dL — ABNORMAL HIGH (ref 1.5–2.5)

## 2013-08-27 LAB — CREATININE, SERUM
Creatinine, Ser: 1.38 mg/dL — ABNORMAL HIGH (ref 0.50–1.35)
GFR, EST AFRICAN AMERICAN: 60 mL/min — AB (ref >90)
GFR, EST NON AFRICAN AMERICAN: 52 mL/min — AB (ref >90)

## 2013-08-27 MED ORDER — KETOROLAC TROMETHAMINE 15 MG/ML IJ SOLN
15.0000 mg | Freq: Three times a day (TID) | INTRAMUSCULAR | Status: DC
  Administered 2013-08-27 – 2013-08-29 (×6): 15 mg via INTRAVENOUS
  Filled 2013-08-27 (×9): qty 1

## 2013-08-27 MED ORDER — FUROSEMIDE 10 MG/ML IJ SOLN
40.0000 mg | Freq: Once | INTRAMUSCULAR | Status: AC
  Administered 2013-08-27: 40 mg via INTRAVENOUS
  Filled 2013-08-27: qty 4

## 2013-08-27 MED ORDER — INSULIN ASPART 100 UNIT/ML ~~LOC~~ SOLN
0.0000 [IU] | SUBCUTANEOUS | Status: DC
  Administered 2013-08-27: 2 [IU] via SUBCUTANEOUS

## 2013-08-27 NOTE — Progress Notes (Signed)
Patient ID: Adrian [name], male   DOB: 23-Jan-1946, 68 y.o.   MRN: [id number] EVENING ROUNDS NOTE :     [address]       [place] [address]             [phone]                 1 Day Post-Op Procedure(s) (LRB): Coronary Artery Bypass Graft times four using left internal mammary artery and right leg greater saphenous vein harvested endoscopically (N/A) INTRAOPERATIVE TRANSESOPHAGEAL ECHOCARDIOGRAM (N/A)  Total Length of Stay:  LOS: 4 days  BP 105/66  Pulse 90  Temp(Src) 98 F (36.7 C) (Oral)  Resp 22  Ht 5\' 11"  (1.803 m)  Wt 196 lb 10.4 oz (89.2 kg)  BMI 27.44 kg/m2  SpO2 92%  .Intake/Output     01/31 0701 - 02/01 0700   P.O. 660   I.V. (mL/kg) 275.3 (3.1)   Blood    IV Piggyback 150   Total Intake(mL/kg) 1085.3 (12.2)   Urine (mL/kg/hr) 255 (0.2)   Blood    Chest Tube 140 (0.1)   Total Output 395   Net +690.3         . sodium chloride 20 mL/hr at 08/27/13 0600  . sodium chloride 20 mL/hr at 08/26/13 2200  . sodium chloride 250 mL (08/27/13 0650)  . dexmedetomidine Stopped (08/26/13 1620)  . lactated ringers 20 mL/hr at 08/27/13 1700  . nitroGLYCERIN Stopped (08/26/13 1240)  . phenylephrine (NEO-SYNEPHRINE) Adult infusion Stopped (08/27/13 1340)     Lab Results  Component Value Date   WBC 10.3 08/27/2013   HGB 10.2* 08/27/2013   HCT 30.0* 08/27/2013   PLT 114* 08/27/2013   GLUCOSE 121* 08/27/2013   CHOL 119 08/24/2013   TRIG 130 08/24/2013   HDL 24* 08/24/2013   LDLCALC 69 08/24/2013   ALT 14 08/25/2013   AST 21 08/25/2013   NA 139 08/27/2013   K 4.1 08/27/2013   CL 104 08/27/2013   CREATININE 1.40* 08/27/2013   BUN 14 08/27/2013   CO2 20 08/27/2013   TSH 1.126 08/24/2013   INR 1.29 08/26/2013   HGBA1C 6.0* 08/24/2013   Cr at 1.4, 255 ml today  Neo off Mild diuresis  Grace Isaac MD  Beeper [phone] Office [phone] 08/27/2013 7:03 PM

## 2013-08-27 NOTE — Op Note (Signed)
NAMEBRENDT, DIBLE NO.:  [id number]  MEDICAL RECORD NO.:  [id number]  LOCATION:  2S11C                        FACILITY:  [facility]  PHYSICIAN:  Revonda Standard. [name], M.D.DATE OF BIRTH:  [birth year]  DATE OF PROCEDURE:  08/26/2013 DATE OF DISCHARGE:                              OPERATIVE REPORT   PREOPERATIVE DIAGNOSIS:  Severe three-vessel coronary artery disease status post non-Q-wave myocardial infarction.  POSTOPERATIVE DIAGNOSIS:  Severe three-vessel coronary artery disease status post non-Q-wave myocardial infarction.  PROCEDURE:  Median sternotomy, extracorporeal circulation, coronary artery bypass grafting x4 (left internal mammary artery to left anterior descending, saphenous vein graft to obtuse marginal 2, sequential saphenous vein graft to acute marginal and distal right coronary) endoscopic vein harvest right thigh.  SURGEON:  Revonda Standard. [name], MD  ASSISTANT:  Suzzanne Cloud, PA  ANESTHESIA:  General.  FINDINGS:  Transesophageal echocardiography revealed preserved left ventricular function with no  valvular pathology, good quality conduits, good quality targets, postbypass transesophageal echocardiography unchanged.  CLINICAL NOTE:  Mr. Kantz is a 68 year old gentleman who had developed a case of recurrent acute diverticulitis.  He had been started on antibiotics for that.  On the morning of admission to the hospital, he became acutely short of breath and had a sense of impending doom.  He called EMS and was brought to the emergency room.  He did rule in for a non-Q-wave MI.  At cardiac catheterization, he had severe three-vessel coronary artery disease.  His abdominal discomfort improved and his white count normalized and it was felt safe to proceed with coronary artery bypass grafting for survival benefit and relief of symptoms.  The indications, risks, benefits, and alternatives were discussed in detail with the patient.  He  understood and accepted the risks and agreed to proceed.  OPERATIVE NOTE:  Mr. Granda was brought to the preoperative holding area on August 26, 2013, there Anesthesia placed a Swan-Ganz catheter and arterial blood pressure monitoring line.  Intravenous antibiotics were administered.  He was taken to the operating room, anesthetized, and intubated.  A Foley catheter was placed.  The chest, abdomen, and legs were prepped and draped in usual sterile fashion.  An incision was made in the medial aspect of the right leg at the level of the knee.  The greater saphenous vein was identified and was harvested endoscopically from the knee to the right groin.  The saphenous vein was a good quality conduit.  Simultaneously with this, a median sternotomy was performed and the left internal mammary artery was harvested using standard technique.  It likewise was a good quality conduit.  The patient was given a calculated dose of heparin prior to dividing the distal end of the mammary artery.  After harvesting and inspecting the conduits, the pericardium was opened after confirming adequate anticoagulation with ACT measurement.  The aorta was cannulated via concentric 2-0 Ethibond pledgeted pursestring sutures.  A dual-stage venous cannula was placed via a pursestring suture in the right atrial appendage.  Cardiopulmonary bypass was instituted.  The patient was cooled to 32 degrees Celsius.  The coronary arteries were inspected and anastomotic sites were chosen.  Of note, the acute marginal was a larger vessel that had  appeared to be on the catheterization compromised by the plaque in the mid right coronary.  An incision was made to the graft sequentially with the distal right coronary artery using the vein that had been already harvested.  The remaining targets were good quality targets.  A foam pad was placed in the pericardium to insulate the heart and protect the left phrenic nerve.  A  temperature probe was placed in myocardial septum and a cardioplegia cannula was placed in the ascending aorta.  The aorta was cross-clamped.  The left ventricle was emptied via aortic root vent.  Cardiac arrest then was achieved with combination of cold antegrade blood cardioplegia and topical iced saline.  After achieving a complete diastolic arrest and adequate myocardial septal cooling to less than 10 degrees Celsius, the following distal anastomoses were performed.  First, a reverse saphenous vein graft was placed sequentially to the acute marginal and distal right coronary arteries.  The acute marginal was a 1.5-mm good quality target vessel.  The terminal branches of the right coronary were relatively small.  Therefore, the graft was placed into the right coronary itself to the takeoff of the posterior descending.  There was some plaque in this area but it was a good quality target.  The 1.5 mm probe did pass easily into the posterior descending as well as distal in the right coronary.  A side-to-side anastomosis was performed to the acute marginal in an end-to-side to the distal right, both were done with running 7-0 Prolene sutures.  All anastomoses were probed proximally and distally at their completion before tying the suture.  Cardioplegia was administered down the graft. There was excellent flow and good hemostasis.  After administering additional cardioplegia down the right graft and the aortic root, OM-2 was exposed.  It was a 1.5 mm good quality target vessel.  There was plaque proximal to the anastomosis but the vessel was clean distally.  The saphenous vein was good quality.  It was anastomosed end-to-side with a running 7-0 Prolene suture.  The probe passed easily proximally and distally.  Cardioplegia was administered. There was excellent flow and good hemostasis.  Additional cardioplegia was administered down the aortic root.  Next, the left internal mammary  artery was brought through a window in the pericardium.  The distal end was beveled.  It was then anastomosed end- to-side to the distal LAD, but the mammary and LAD were 2 mm good quality vessels and end-to-side anastomosis was performed with a running 8-0 Prolene suture.  At the completion of the mammary to LAD anastomosis, the bulldog clamps were briefly removed to inspect for hemostasis.  Immediate rapid septal rewarming was noted.  The bulldog clamp was replaced and the mammary pedicle was tacked to the epicardial surface of the heart with 6-0 Prolene sutures.  Rewarming was begun after giving additional cardioplegia down the aortic root.  The cardioplegic cannula was removed.  The proximal vein graft anastomoses were performed to 4.5 mm punch aortotomies with running 6-0 Prolene sutures.  At completion of the final proximal anastomosis, the patient was placed in Trendelenburg position.  Lidocaine was administered.  The bulldog clamp was removed from the mammary artery and immediate rapid septal rewarming was again noted.  The left ventricle and aortic root were de-aired and the aortic cross-clamp was removed. The total crossclamp time was 71 minutes.  The patient initially was in heart block but resumed to sinus rhythm spontaneously and did not require defibrillation.  While rewarming was completed, all  proximal and distal anastomoses were inspected for hemostasis.  Epicardial pacing wires were placed on the right ventricle and right atrium.  When the patient had rewarmed to a core temperature of 37 degrees Celsius, he was weaned from cardiopulmonary bypass on the first attempt without difficulty.  The total bypass time was 101 minutes.  The initial cardiac index was greater than 2 liters/min/m2. Postbypass transesophageal echocardiography revealed preserved left ventricular function with no valvular pathology.  A test dose of protamine was administered and was well tolerated.   The atrial and aortic cannulae were removed.  The remainder of protamine was administered without incident.  The chest was irrigated with warm saline.  Hemostasis was achieved.  The pericardium was reapproximated with interrupted 3-0 silk sutures.  It came together easily without tension.  A left pleural and single mediastinal chest tubes were placed through separate subcostal incisions.  The sternum was closed with interrupted heavy gauge stainless steel wires.  The pectoralis fascia, subcutaneous tissue, and skin were closed in standard fashion.  All sponge, needle, and instrument counts were correct at the end of the procedure.  The patient was taken from the operating room to the surgical intensive care unit in good condition.     Revonda Standard [name], M.D.     SCH/MEDQ  D:  08/26/2013  T:  08/27/2013  Job:  [id number]

## 2013-08-27 NOTE — Progress Notes (Addendum)
Patient ID: Adrian [name], male   DOB: 28-May-1946, 68 y.o.   MRN: [id number] TCTS DAILY ICU PROGRESS NOTE                   [address]            [place] [address]          [phone]   1 Day Post-Op Procedure(s) (LRB): Coronary Artery Bypass Graft times four using left internal mammary artery and right leg greater saphenous vein harvested endoscopically (N/A) INTRAOPERATIVE TRANSESOPHAGEAL ECHOCARDIOGRAM (N/A)  Total Length of Stay:  LOS: 4 days   Subjective: Not moving much and somnolent from pain meds but neuro intatc  Objective: Vital signs in last 24 hours: Temp:  [96.6 F (35.9 C)-100.6 F (38.1 C)] 99.5 F (37.5 C) (01/31 1000) Pulse Rate:  [80-91] 90 (01/31 1000) Cardiac Rhythm:  [-] Atrial paced (01/31 0800) Resp:  [12-37] 26 (01/31 1000) BP: (85-115)/(54-89) 110/65 mmHg (01/31 1000) SpO2:  [89 %-100 %] 95 % (01/31 1000) FiO2 (%):  [40 %-50 %] 40 % (01/30 1700) Weight:  [196 lb 10.4 oz (89.2 kg)] 196 lb 10.4 oz (89.2 kg) (01/31 0500)  Filed Weights   08/25/13 0500 08/26/13 0300 08/27/13 0500  Weight: 202 lb 6.1 oz (91.8 kg) 194 lb 10.7 oz (88.3 kg) 196 lb 10.4 oz (89.2 kg)    Weight change: 1 lb 15.8 oz (0.9 kg)   Hemodynamic parameters for last 24 hours: PAP: (15-45)/(8-21) 21/11 mmHg CO:  [4.4 L/min-6.8 L/min] 6 L/min CI:  [2.1 L/min/m2-3.3 L/min/m2] 2.9 L/min/m2  Intake/Output from previous day: 01/30 0701 - 01/31 0700 In: 6051.5 [P.O.:240; I.V.:3602.5; Blood:459; IV Piggyback:1750] Out: 0347 [Urine:5120; Blood:800; Chest Tube:430]  Intake/Output this shift: Total I/O In: 51.3 [I.V.:51.3] Out: 70 [Urine:40; Chest Tube:30]  Current Meds: Scheduled Meds: . acetaminophen  1,000 mg Oral Q6H   Or  . acetaminophen (TYLENOL) oral liquid 160 mg/5 mL  1,000 mg Per Tube Q6H  . ampicillin-sulbactam (UNASYN) IV  3 g Intravenous Q6H  . aspirin EC  325 mg Oral Daily   Or  . aspirin  324 mg Per Tube Daily  . bisacodyl  10 mg Oral  Daily   Or  . bisacodyl  10 mg Rectal Daily  . cefUROXime (ZINACEF)  IV  1.5 g Intravenous Q12H  . docusate sodium  200 mg Oral Daily  . fluticasone  2 spray Each Nare Daily  . gabapentin  300 mg Oral TID  . insulin aspart  0-24 Units Subcutaneous Q4H  . metoprolol tartrate  12.5 mg Oral BID   Or  . metoprolol tartrate  12.5 mg Per Tube BID  . [START ON 08/28/2013] pantoprazole  40 mg Oral Daily  . simvastatin  40 mg Oral QHS  . sodium chloride  3 mL Intravenous Q12H   Continuous Infusions: . sodium chloride 20 mL/hr at 08/27/13 0600  . sodium chloride 20 mL/hr at 08/26/13 2200  . sodium chloride 250 mL (08/27/13 0650)  . dexmedetomidine Stopped (08/26/13 1620)  . lactated ringers 20 mL/hr at 08/27/13 0800  . nitroGLYCERIN Stopped (08/26/13 1240)  . phenylephrine (NEO-SYNEPHRINE) Adult infusion 15 mcg/min (08/27/13 0800)   PRN Meds:.metoprolol, midazolam, morphine injection, ondansetron (ZOFRAN) IV, oxyCODONE, sodium chloride  General appearance: cooperative and no distress Neurologic: intact Heart: regular rate and rhythm, S1, S2 normal, no murmur, click, rub or gallop Lungs: diminished breath sounds bibasilar Abdomen: soft, non-tender; bowel sounds normal; no masses,  no organomegaly  Extremities: extremities normal, atraumatic, no cyanosis or edema and Homans sign is negative, no sign of DVT Wound: sternum stable No abdominal pain or tenderness  Lab Results: CBC: Recent Labs  08/26/13 1835 08/26/13 1837 08/27/13 0400  WBC 6.1  --  11.4*  HGB 11.2* 10.5* 10.9*  HCT 32.7* 31.0* 31.6*  PLT 93*  --  135*   BMET:  Recent Labs  08/26/13 0225  08/26/13 1837 08/27/13 0400  NA 145  < > 142 142  K 4.3  < > 4.5 4.5  CL 109  --  111 108  CO2 22  --   --  20  GLUCOSE 87  < > 120* 132*  BUN 10  --  7 11  CREATININE 1.16  < > 1.10 1.24  CALCIUM 8.6  --   --  7.8*  < > = values in this interval not displayed.  PT/INR:  Recent Labs  08/26/13 1254  LABPROT 15.8*  INR  1.29   Radiology: Dg Chest Portable 1 View In Am  08/27/2013   CLINICAL DATA:  Postop  EXAM: PORTABLE CHEST - 1 VIEW  COMPARISON:  the previous day's study  FINDINGS: Patient has been extubated and the nasogastric tube removed. Right IJ Swan-Ganz remains in the right proximal right pulmonary artery. Left chest to the mediastinal drain stable. No pneumothorax. Slightly lower lung volumes with some mild linear opacities in both lung bases left greater than right probably some subsegmental atelectasis. No effusion. Heart size normal. .  IMPRESSION: 1. Extubation with some increase in bibasilar subsegmental atelectasis. 2. Remaining Support hardware stable in position.   Electronically Signed   By: Arne Cleveland M.D.   On: 08/27/2013 07:50   Dg Chest Portable 1 View  08/26/2013   CLINICAL DATA:  Post CABG  EXAM: PORTABLE CHEST - 1 VIEW  COMPARISON:  Portable exam 1339 hr compared to 08/23/2013  FINDINGS: Tip of endotracheal tube projects 5.7 cm above carinal.  Nasogastric tube extends into stomach.  Right jugular Swan-Ganz catheter tip projects over proximal right pulmonary artery.  Mediastinal drain and left thoracostomy tube present.  Epicardial pacing wires noted.  Normal heart size post CABG.  Mediastinal contours and pulmonary vascularity normal for postoperative patient.  Peribronchial thickening with linear subsegmental atelectasis in the left upper lobe.  No acute infiltrate, pleural effusion or pneumothorax.  No acute osseous findings.  IMPRESSION: Postsurgical changes as above.   Electronically Signed   By: Lavonia Dana M.D.   On: 08/26/2013 14:02     Assessment/Plan: S/P Procedure(s) (LRB): Coronary Artery Bypass Graft times four using left internal mammary artery and right leg greater saphenous vein harvested endoscopically (N/A) INTRAOPERATIVE TRANSESOPHAGEAL ECHOCARDIOGRAM (N/A) Mobilize Diuresis Continue foley due to strict I&O and urinary output monitoring See progression orders Add  few doses toradol for pain relief Wean off neo D/c  ct tube  Continue post op antibiotics due to preop diverticular disease    Alys Dulak B 08/27/2013 10:11 AM

## 2013-08-27 NOTE — Progress Notes (Signed)
Patient Name: Adrian [name] Date of Encounter: 08/27/2013  Principal Problem:   NSTEMI (non-ST elevated myocardial infarction) Active Problems:   Diverticulitis   CAD, multiple vessel   S/P CABG x 4   Length of Stay: 4  SUBJECTIVE  He has a lot of pain and is a little reluctant to move and use incentive spirometry. Off pressors, NSR 60-70 (paced at 90) Getting up in chair today  CURRENT MEDS . acetaminophen  1,000 mg Oral Q6H   Or  . acetaminophen (TYLENOL) oral liquid 160 mg/5 mL  1,000 mg Per Tube Q6H  . ampicillin-sulbactam (UNASYN) IV  3 g Intravenous Q6H  . aspirin EC  325 mg Oral Daily   Or  . aspirin  324 mg Per Tube Daily  . bisacodyl  10 mg Oral Daily   Or  . bisacodyl  10 mg Rectal Daily  . cefUROXime (ZINACEF)  IV  1.5 g Intravenous Q12H  . docusate sodium  200 mg Oral Daily  . fluticasone  2 spray Each Nare Daily  . gabapentin  300 mg Oral TID  . insulin aspart  0-24 Units Subcutaneous Q4H  . ketorolac  15 mg Intravenous Q8H  . metoprolol tartrate  12.5 mg Oral BID   Or  . metoprolol tartrate  12.5 mg Per Tube BID  . [START ON 08/28/2013] pantoprazole  40 mg Oral Daily  . simvastatin  40 mg Oral QHS  . sodium chloride  3 mL Intravenous Q12H    OBJECTIVE  Filed Vitals:   08/27/13 1100 08/27/13 1155 08/27/13 1200 08/27/13 1300  BP: 110/65  121/66 107/64  Pulse: 89  91 90  Temp: 99.5 F (37.5 C) 98.3 F (36.8 C)    TempSrc:  Oral    Resp: 19  35 18  Height:      Weight:      SpO2: 94%  93% 95%    Intake/Output Summary (Last 24 hours) at 08/27/13 1423 Last data filed at 08/27/13 1400  Gross per 24 hour  Intake 2846.73 ml  Output   2425 ml  Net 421.73 ml   Filed Weights   08/25/13 0500 08/26/13 0300 08/27/13 0500  Weight: 91.8 kg (202 lb 6.1 oz) 88.3 kg (194 lb 10.7 oz) 89.2 kg (196 lb 10.4 oz)    PHYSICAL EXAM  General: Pleasant, NAD. Neuro: Alert and oriented X 3. Moves all extremities spontaneously. Psych: Normal affect. HEENT:   Normal  Neck: Supple without bruits or JVD. Lungs:  Resp regular and unlabored, CTA. Sternotomy healthy Heart: RRR no s3, s4, or murmurs.No rub Abdomen: Soft, non-tender, non-distended, BS + x 4.  Extremities: No clubbing, cyanosis or edema. DP/PT/Radials 2+ and equal bilaterally.   CBC  Recent Labs  08/26/13 1835 08/26/13 1837 08/27/13 0400  WBC 6.1  --  11.4*  HGB 11.2* 10.5* 10.9*  HCT 32.7* 31.0* 31.6*  MCV 86.1  --  85.6  PLT 93*  --  161*   Basic Metabolic Panel  Recent Labs  08/26/13 0225  08/26/13 1835 08/26/13 1837 08/27/13 0400  NA 145  < >  --  142 142  K 4.3  < >  --  4.5 4.5  CL 109  --   --  111 108  CO2 22  --   --   --  20  GLUCOSE 87  < >  --  120* 132*  BUN 10  --   --  7 11  CREATININE 1.16  --  1.13 1.10 1.24  CALCIUM 8.6  --   --   --  7.8*  MG  --   --  3.2*  --  2.6*  < > = values in this interval not displayed. Liver Function Tests  Recent Labs  08/25/13 0304  AST 21  ALT 14  ALKPHOS 56  BILITOT 0.5  PROT 6.0  ALBUMIN 3.2*   No results found for this basename: LIPASE, AMYLASE,  in the last 72 hours Cardiac Enzymes  Recent Labs  08/24/13 2320  TROPONINI 0.97*   Radiology/Studies  Ct Angio Chest Pe W/cm &/or Wo Cm  08/24/2013   CLINICAL DATA:  Shortness of breath  EXAM: CT ANGIOGRAPHY CHEST WITH CONTRAST  TECHNIQUE: Multidetector CT imaging of the chest was performed using the standard protocol during bolus administration of intravenous contrast. Multiplanar CT image reconstructions including MIPs were obtained to evaluate the vascular anatomy.  CONTRAST:  1105mL OMNIPAQUE IOHEXOL 350 MG/ML SOLN  COMPARISON:  08/23/2013 radiograph  FINDINGS: The pulmonary arterial branch vessels are patent.  Normal caliber aorta with scattered atherosclerotic disease.  Upper normal to mildly enlarged heart size with left ventricular hypertrophy. Coronary artery and aortic valvular calcifications. Trace pericardial fluid. No pleural effusion. No  intrathoracic lymphadenopathy.  Upper abdominal images show a cyst arising from the right kidney anteriorly.  Lobular attenuation along the right margin of the trachea. Central airways are otherwise patent. Mild linear lung base opacities and mild lower lobe peribronchial thickening. Mild apical scarring.  No acute osseous finding.  Review of the MIP images confirms the above findings.  IMPRESSION: No pulmonary embolism.  Lobular attenuation along the right margin of the trachea is favored to reflect mucous/secretions. Attention on follow-up.  Mild lower lobe peribronchial thickening is nonspecific. Correlate clinically for mild bronchitis. Mild bibasilar opacities, favor atelectasis.  Heart size upper normal to mildly enlarged with left ventricular hypertrophy. Coronary artery calcifications.   Electronically Signed   By: Carlos Levering M.D.   On: 08/24/2013 00:34   Ct Abdomen Pelvis W Contrast  08/24/2013   CLINICAL DATA:  Abdominal pain  EXAM: CT ABDOMEN AND PELVIS WITH CONTRAST  TECHNIQUE: Multidetector CT imaging of the abdomen and pelvis was performed using the standard protocol following bolus administration of intravenous contrast.  CONTRAST:  138mL OMNIPAQUE IOHEXOL 350 MG/ML SOLN  COMPARISON:  None.  FINDINGS: See separate chest CT report. Small hiatal hernia and mild distal esophageal wall thickening.  Decreased attenuation of the liver is nonspecific post contrast however can be seen in the setting of fatty infiltration. No radiodense gallstones or biliary ductal dilatation. No appreciable abnormality of the pancreas, adrenal glands, spleen.  Left renal atrophy and fullness of the left renal pelvis. No hydroureter. Multiple bilateral renal cysts. Nonobstructing stone on the right. No hydroureteronephrosis on the right.  Colonic diverticulosis. Inflamed diverticulum along the proximal sigmoid colon with adjacent ill-defined fluid, fat stranding, and fascial thickening. No loculated fluid collection.  No free intraperitoneal air. No bowel obstruction. Appendix not identified. No right lower quadrant inflammation. Small duodenal diverticulum. No enlarged lymph nodes.  Moderately distended bladder with mild wall thickening. Small fat containing right inguinal hernia. Prostate gland measures 4.5 cm transverse diameter with questionable TURP defect. Partially imaged right hydrocele.  Scattered atherosclerotic disease of the aorta and branch vessels. Mild infrarenal ectasia up to 2.6 cm.  L5 pars defects. Grade 1 anterolisthesis of L5 and S1. L5-S1 degenerative disc disease.  IMPRESSION: Acute proximal sigmoid colon diverticulitis. No free intraperitoneal air or  loculated fluid collection to suggest abscess.  Small hiatal hernia and mild distal esophageal wall thickening, may reflect esophagitis or GERD.  Suspect hepatic steatosis.  Left renal atrophy and bilateral renal cysts. Nonobstructing interpolar/ lower pole right renal stone. Left renal pelvis fullness is likely chronic.  Moderately distended bladder with mild wall thickening. May reflect chronic partial outlet obstruction. Correlate with urinalysis to exclude cystitis.   Electronically Signed   By: Carlos Levering M.D.   On: 08/24/2013 00:41   Dg Chest Portable 1 View In Am  08/27/2013   CLINICAL DATA:  Postop  EXAM: PORTABLE CHEST - 1 VIEW  COMPARISON:  the previous day's study  FINDINGS: Patient has been extubated and the nasogastric tube removed. Right IJ Swan-Ganz remains in the right proximal right pulmonary artery. Left chest to the mediastinal drain stable. No pneumothorax. Slightly lower lung volumes with some mild linear opacities in both lung bases left greater than right probably some subsegmental atelectasis. No effusion. Heart size normal. .  IMPRESSION: 1. Extubation with some increase in bibasilar subsegmental atelectasis. 2. Remaining Support hardware stable in position.   Electronically Signed   By: Arne Cleveland M.D.   On: 08/27/2013  07:50   Dg Chest Portable 1 View  08/26/2013   CLINICAL DATA:  Post CABG  EXAM: PORTABLE CHEST - 1 VIEW  COMPARISON:  Portable exam 1339 hr compared to 08/23/2013  FINDINGS: Tip of endotracheal tube projects 5.7 cm above carinal.  Nasogastric tube extends into stomach.  Right jugular Swan-Ganz catheter tip projects over proximal right pulmonary artery.  Mediastinal drain and left thoracostomy tube present.  Epicardial pacing wires noted.  Normal heart size post CABG.  Mediastinal contours and pulmonary vascularity normal for postoperative patient.  Peribronchial thickening with linear subsegmental atelectasis in the left upper lobe.  No acute infiltrate, pleural effusion or pneumothorax.  No acute osseous findings.  IMPRESSION: Postsurgical changes as above.   Electronically Signed   By: Lavonia Dana M.D.   On: 08/26/2013 14:02   Dg Chest Port 1 View  08/23/2013   CLINICAL DATA:  Short of breath.  Negative for chest pain.  EXAM: PORTABLE CHEST - 1 VIEW  COMPARISON:  11/14/2004.  FINDINGS: Cardiopericardial silhouette within normal limits. Mediastinal contours normal. Trachea midline. No airspace disease or effusion. Monitoring leads project over the chest. Mild basilar atelectasis.  IMPRESSION: No active disease.   Electronically Signed   By: Dereck Ligas M.D.   On: 08/23/2013 21:54    TELE A paced, V sensed  ASSESSMENT AND PLAN  Excellent postop progress, but need to use incentive spirometer. Exam suggests substantial left lower lung atelectasis.  Sanda Klein, MD, [facility] [phone] office [phone] pager  08/27/2013 2:23 PM

## 2013-08-28 ENCOUNTER — Inpatient Hospital Stay (HOSPITAL_COMMUNITY): Payer: Non-veteran care

## 2013-08-28 LAB — GLUCOSE, CAPILLARY
GLUCOSE-CAPILLARY: 101 mg/dL — AB (ref 70–99)
GLUCOSE-CAPILLARY: 100 mg/dL — AB (ref 70–99)
GLUCOSE-CAPILLARY: 92 mg/dL (ref 70–99)
Glucose-Capillary: 117 mg/dL — ABNORMAL HIGH (ref 70–99)
Glucose-Capillary: 104 mg/dL — ABNORMAL HIGH (ref 70–99)
Glucose-Capillary: 110 mg/dL — ABNORMAL HIGH (ref 70–99)
Glucose-Capillary: 105 mg/dL — ABNORMAL HIGH (ref 70–99)

## 2013-08-28 LAB — BASIC METABOLIC PANEL
BUN: 15 mg/dL (ref 6–23)
CO2: 23 mEq/L (ref 19–32)
Calcium: 7.9 mg/dL — ABNORMAL LOW (ref 8.4–10.5)
Chloride: 106 mEq/L (ref 96–112)
Creatinine, Ser: 1.48 mg/dL — ABNORMAL HIGH (ref 0.50–1.35)
GFR calc Af Amer: 55 mL/min — ABNORMAL LOW (ref >90)
GFR calc non Af Amer: 48 mL/min — ABNORMAL LOW (ref >90)
Glucose, Bld: 108 mg/dL — ABNORMAL HIGH (ref 70–99)
Potassium: 4.4 mEq/L (ref 3.7–5.3)
Sodium: 141 mEq/L (ref 137–147)

## 2013-08-28 LAB — CBC
HCT: 29.3 % — ABNORMAL LOW (ref 39.0–52.0)
Hemoglobin: 10 g/dL — ABNORMAL LOW (ref 13.0–17.0)
MCH: 29.5 pg (ref 26.0–34.0)
MCHC: 34.1 g/dL (ref 30.0–36.0)
MCV: 86.4 fL (ref 78.0–100.0)
Platelets: 102 10*3/uL — ABNORMAL LOW (ref 150–400)
RBC: 3.39 MIL/uL — ABNORMAL LOW (ref 4.22–5.81)
RDW: 14.9 % (ref 11.5–15.5)
WBC: 7.8 10*3/uL (ref 4.0–10.5)

## 2013-08-28 MED ORDER — METOCLOPRAMIDE HCL 5 MG/ML IJ SOLN
5.0000 mg | Freq: Three times a day (TID) | INTRAMUSCULAR | Status: AC
  Administered 2013-08-28: 5 mg via INTRAVENOUS
  Administered 2013-08-28: 12:00:00 via INTRAVENOUS
  Administered 2013-08-29: 5 mg via INTRAVENOUS
  Filled 2013-08-28: qty 1
  Filled 2013-08-28 (×2): qty 2

## 2013-08-28 MED ORDER — PROMETHAZINE HCL 25 MG/ML IJ SOLN
12.5000 mg | Freq: Four times a day (QID) | INTRAMUSCULAR | Status: DC | PRN
  Administered 2013-08-28 – 2013-08-30 (×2): 12.5 mg via INTRAVENOUS
  Filled 2013-08-28 (×2): qty 1

## 2013-08-28 NOTE — Progress Notes (Signed)
Patient ID: Adrian [name], male   DOB: 1945/11/28, 68 y.o.   MRN: [id number] [facility] DAILY ICU PROGRESS NOTE                   [address]            [place] [address]          [phone]   2 Days Post-Op Procedure(s) (LRB): Coronary Artery Bypass Graft times four using left internal mammary artery and right leg greater saphenous vein harvested endoscopically (N/A) INTRAOPERATIVE TRANSESOPHAGEAL ECHOCARDIOGRAM (N/A)  Total Length of Stay:  LOS: 5 days   Subjective: Nausea main complaint this am, not responding to zofran, no abdominal pain  Objective: Vital signs in last 24 hours: Temp:  [98 F (36.7 C)-98.4 F (36.9 C)] 98.1 F (36.7 C) (02/01 0810) Pulse Rate:  [63-92] 68 (02/01 1100) Cardiac Rhythm:  [-] Normal sinus rhythm (02/01 1100) Resp:  [9-35] 19 (02/01 1100) BP: (83-121)/(49-89) 103/60 mmHg (02/01 1100) SpO2:  [90 %-97 %] 93 % (02/01 1100) Weight:  [199 lb 4.7 oz (90.4 kg)] 199 lb 4.7 oz (90.4 kg) (02/01 0700)  Filed Weights   08/26/13 0300 08/27/13 0500 08/28/13 0700  Weight: 194 lb 10.7 oz (88.3 kg) 196 lb 10.4 oz (89.2 kg) 199 lb 4.7 oz (90.4 kg)    Weight change: 2 lb 10.3 oz (1.2 kg)   Hemodynamic parameters for last 24 hours:    Intake/Output from previous day: 01/31 0701 - 02/01 0700 In: 1665.3 [P.O.:660; I.V.:555.3; IV Piggyback:450] Out: [id number] [Urine:1485; Chest Tube:190]  Intake/Output this shift: Total I/O In: 500 [P.O.:420; I.V.:80] Out: 115 [Urine:115]  Current Meds: Scheduled Meds: . acetaminophen  1,000 mg Oral Q6H   Or  . acetaminophen (TYLENOL) oral liquid 160 mg/5 mL  1,000 mg Per Tube Q6H  . ampicillin-sulbactam (UNASYN) IV  3 g Intravenous Q6H  . aspirin EC  325 mg Oral Daily   Or  . aspirin  324 mg Per Tube Daily  . bisacodyl  10 mg Oral Daily   Or  . bisacodyl  10 mg Rectal Daily  . docusate sodium  200 mg Oral Daily  . fluticasone  2 spray Each Nare Daily  . gabapentin  300 mg Oral TID  . insulin  aspart  0-24 Units Subcutaneous Q4H  . ketorolac  15 mg Intravenous Q8H  . metoprolol tartrate  12.5 mg Oral BID   Or  . metoprolol tartrate  12.5 mg Per Tube BID  . pantoprazole  40 mg Oral Daily  . simvastatin  40 mg Oral QHS  . sodium chloride  3 mL Intravenous Q12H   Continuous Infusions: . sodium chloride 20 mL/hr at 08/27/13 0600  . sodium chloride 20 mL/hr at 08/26/13 2200  . sodium chloride 250 mL (08/27/13 0650)  . dexmedetomidine Stopped (08/26/13 1620)  . lactated ringers 20 mL/hr at 08/28/13 1100  . nitroGLYCERIN Stopped (08/26/13 1240)  . phenylephrine (NEO-SYNEPHRINE) Adult infusion Stopped (08/27/13 1340)   PRN Meds:.metoprolol, midazolam, morphine injection, ondansetron (ZOFRAN) IV, oxyCODONE, sodium chloride  General appearance: alert, cooperative and mild distress Neurologic: intact Heart: regular rate and rhythm, S1, S2 normal, no murmur, click, rub or gallop Lungs: diminished breath sounds bibasilar Abdomen: soft, non-tender; bowel sounds present but hypoactive; no masses,  no organomegaly Extremities: extremities normal, atraumatic, no cyanosis or edema and Homans sign is negative, no sign of DVT Wound: sternum stable  Lab Results: CBC: Recent Labs  08/27/13 1700 08/27/13 1745  08/28/13 0400  WBC 10.3  --  7.8  HGB 10.4* 10.2* 10.0*  HCT 30.5* 30.0* 29.3*  PLT 114*  --  102*   BMET:  Recent Labs  08/27/13 0400  08/27/13 1745 08/28/13 0400  NA 142  --  139 141  K 4.5  --  4.1 4.4  CL 108  --  104 106  CO2 20  --   --  23  GLUCOSE 132*  --  121* 108*  BUN 11  --  14 15  CREATININE 1.24  < > 1.40* 1.48*  CALCIUM 7.8*  --   --  7.9*  < > = values in this interval not displayed.  PT/INR:  Recent Labs  08/26/13 1254  LABPROT 15.8*  INR 1.29   Acute Kidney Injury (any one)  Increase in SCr by > 0.3 within 48 hours  Increase SCr to > 1.5 times baseline  Urine volume < 0.5 ml/kg/h for 6 hrs  Stage:  Risk:   1.5x increase in creatinine or  GFR decrease by 25% or UOP <0.56ml/kgperhr for 6 hrs  Injury:  2x increase in creatinine or GFR decrease by 50% or UOP < 0.21ml/kgperhr for 12 hr  Failure:3X increase in creatinine or GFR decrease by 75% or UOP < 0.41ml/kgperhr for 12 hr or                anuria 12 hrs  Loss: complete loss of kidney  function for more then 4 weeks  End-stage renal disease:Complete loss of kidney function for more then 3 months  Lab Results  Component Value Date   CREATININE 1.48* 08/28/2013   CREATININE: 1.48 mg/dL ABNORMAL (08/28/13 0400) Estimated creatinine clearance - Cockcroft-Gault CrCl: 56.5 mL/min   Radiology: Dg Chest Port 1 View  08/28/2013   CLINICAL DATA:  Postop cardiac surgery.  EXAM: PORTABLE CHEST - 1 VIEW  COMPARISON:  08/27/2013  FINDINGS: Since prior study, the Swan-Ganz catheter and mediastinal tube have been removed. The right internal jugular introducer sheath and a left chest tube remaining in place.  Cardiac silhouette is normal in size and configuration. No mediastinal widening.  Mild lung base atelectasis is stable no pulmonary edema. No pneumothorax.  IMPRESSION: 1. Status post cardiac surgery. No evidence of an operative complication. 2. Remaining support apparatus is well positioned. 3. Persistent mild lung base atelectasis, greater the left. No pulmonary edema. No pneumothorax.   Electronically Signed   By: Lajean Manes M.D.   On: 08/28/2013 07:45   Dg Chest Portable 1 View In Am  08/27/2013   CLINICAL DATA:  Postop  EXAM: PORTABLE CHEST - 1 VIEW  COMPARISON:  the previous day's study  FINDINGS: Patient has been extubated and the nasogastric tube removed. Right IJ Swan-Ganz remains in the right proximal right pulmonary artery. Left chest to the mediastinal drain stable. No pneumothorax. Slightly lower lung volumes with some mild linear opacities in both lung bases left greater than right probably some subsegmental atelectasis. No effusion. Heart size normal. .  IMPRESSION: 1. Extubation  with some increase in bibasilar subsegmental atelectasis. 2. Remaining Support hardware stable in position.   Electronically Signed   By: Arne Cleveland M.D.   On: 08/27/2013 07:50   Dg Chest Portable 1 View  08/26/2013   CLINICAL DATA:  Post CABG  EXAM: PORTABLE CHEST - 1 VIEW  COMPARISON:  Portable exam 1339 hr compared to 08/23/2013  FINDINGS: Tip of endotracheal tube projects 5.7 cm above carinal.  Nasogastric tube extends  into stomach.  Right jugular Swan-Ganz catheter tip projects over proximal right pulmonary artery.  Mediastinal drain and left thoracostomy tube present.  Epicardial pacing wires noted.  Normal heart size post CABG.  Mediastinal contours and pulmonary vascularity normal for postoperative patient.  Peribronchial thickening with linear subsegmental atelectasis in the left upper lobe.  No acute infiltrate, pleural effusion or pneumothorax.  No acute osseous findings.  IMPRESSION: Postsurgical changes as above.   Electronically Signed   By: Lavonia Dana M.D.   On: 08/26/2013 14:02     Assessment/Plan: S/P Procedure(s) (LRB): Coronary Artery Bypass Graft times four using left internal mammary artery and right leg greater saphenous vein harvested endoscopically (N/A) INTRAOPERATIVE TRANSESOPHAGEAL ECHOCARDIOGRAM (N/A) Mild elevation of cr 1.48/ Acute Kidney Injury -Stage Risk Nausea, will add Reglan and Phenergan  no tenders of abdomen Leave foley to monitor UOP with increase in Cr  Bashar Milam B 08/28/2013 11:12 AM

## 2013-08-28 NOTE — Progress Notes (Signed)
Patient ID: EFTON THOMLEY, male   DOB: [birth year], 68 y.o.   MRN: [id number] EVENING ROUNDS NOTE :     [address]       [place] [address]             [phone]                 2 Days Post-Op Procedure(s) (LRB): Coronary Artery Bypass Graft times four using left internal mammary artery and right leg greater saphenous vein harvested endoscopically (N/A) INTRAOPERATIVE TRANSESOPHAGEAL ECHOCARDIOGRAM (N/A)  Total Length of Stay:  LOS: 5 days  BP 114/62  Pulse 62  Temp(Src) 98.1 F (36.7 C) (Oral)  Resp 14  Ht 5\' 11"  (1.803 m)  Wt 199 lb 4.7 oz (90.4 kg)  BMI 27.81 kg/m2  SpO2 92%  .Intake/Output     02/01 0701 - 02/02 0700   P.O. 570   I.V. (mL/kg) 220.3 (2.4)   IV Piggyback 200   Total Intake(mL/kg) 990.3 (11)   Urine (mL/kg/hr) 365 (0.3)   Chest Tube 20 (0)   Total Output 385   Net +605.3         . sodium chloride 20 mL/hr at 08/27/13 0600  . sodium chloride 20 mL/hr at 08/28/13 1800  . sodium chloride 250 mL (08/27/13 0650)  . dexmedetomidine Stopped (08/26/13 1620)  . lactated ringers Stopped (08/28/13 1655)  . nitroGLYCERIN Stopped (08/26/13 1240)  . phenylephrine (NEO-SYNEPHRINE) Adult infusion Stopped (08/27/13 1340)     Lab Results  Component Value Date   WBC 7.8 08/28/2013   HGB 10.0* 08/28/2013   HCT 29.3* 08/28/2013   PLT 102* 08/28/2013   GLUCOSE 108* 08/28/2013   CHOL 119 08/24/2013   TRIG 130 08/24/2013   HDL 24* 08/24/2013   LDLCALC 69 08/24/2013   ALT 14 08/25/2013   AST 21 08/25/2013   NA 141 08/28/2013   K 4.4 08/28/2013   CL 106 08/28/2013   CREATININE 1.48* 08/28/2013   BUN 15 08/28/2013   CO2 23 08/28/2013   TSH 1.126 08/24/2013   INR 1.29 08/26/2013   HGBA1C 6.0* 08/24/2013   365 uop, stable day, nausea improved with phenergen  Grace Isaac MD  Beeper [phone] Office [phone] 08/28/2013 7:22 PM

## 2013-08-28 NOTE — Progress Notes (Signed)
Subjective:  Pain control is better, now troubled by nausea. No arrhythmia or other serious setbacks. Lung expansion improved by exam and volume achieved on inc. spirom.  Objective:  Temp:  [97.5 F (36.4 C)-98.4 F (36.9 C)] 97.5 F (36.4 C) (02/01 1206) Pulse Rate:  [63-92] 68 (02/01 1200) Resp:  [9-23] 19 (02/01 1100) BP: (83-119)/(49-89) 119/75 mmHg (02/01 1200) SpO2:  [90 %-97 %] 92 % (02/01 1200) Weight:  [90.4 kg (199 lb 4.7 oz)] 90.4 kg (199 lb 4.7 oz) (02/01 0700) Weight change: 1.2 kg (2 lb 10.3 oz)  Intake/Output from previous day: 01/31 0701 - 02/01 0700 In: 1665.3 [P.O.:660; I.V.:555.3; IV Piggyback:450] Out: 9211 [Urine:1485; Chest Tube:190]  Intake/Output from this shift: Total I/O In: 550 [P.O.:450; I.V.:100] Out: 115 [Urine:115]  Medications: Current Facility-Administered Medications  Medication Dose Route Frequency Provider Last Rate Last Dose  . 0.45 % sodium chloride infusion   Intravenous Continuous Coolidge Breeze, PA-C 20 mL/hr at 08/27/13 0600    . 0.9 %  sodium chloride infusion   Intravenous Continuous Coolidge Breeze, PA-C 20 mL/hr at 08/26/13 2200    . 0.9 %  sodium chloride infusion  250 mL Intravenous Continuous Coolidge Breeze, PA-C 1 mL/hr at 08/27/13 0650 250 mL at 08/27/13 0650  . acetaminophen (TYLENOL) tablet 1,000 mg  1,000 mg Oral Q6H Coolidge Breeze, PA-C   1,000 mg at 08/28/13 1151   Or  . acetaminophen (TYLENOL) solution 1,000 mg  1,000 mg Per Tube Q6H Coolidge Breeze, PA-C      . Ampicillin-Sulbactam (UNASYN) 3 g in sodium chloride 0.9 % 100 mL IVPB  3 g Intravenous Q6H Berle Mull, MD   3 g at 08/28/13 1151  . aspirin EC tablet 325 mg  325 mg Oral Daily Coolidge Breeze, PA-C   325 mg at 08/28/13 1002   Or  . aspirin chewable tablet 324 mg  324 mg Per Tube Daily Gina L Collins, PA-C      . bisacodyl (DULCOLAX) EC tablet 10 mg  10 mg Oral Daily Coolidge Breeze, PA-C   10 mg at 08/28/13 1002   Or  . bisacodyl (DULCOLAX) suppository 10  mg  10 mg Rectal Daily Gina L Collins, PA-C      . dexmedetomidine (PRECEDEX) 200 MCG/50ML infusion  0.1-0.7 mcg/kg/hr Intravenous Continuous Coolidge Breeze, PA-C   0.2 mcg/kg/hr at 08/26/13 1600  . docusate sodium (COLACE) capsule 200 mg  200 mg Oral Daily Coolidge Breeze, PA-C   200 mg at 08/28/13 1003  . fluticasone (FLONASE) 50 MCG/ACT nasal spray 2 spray  2 spray Each Nare Daily Berle Mull, MD   2 spray at 08/27/13 1348  . gabapentin (NEURONTIN) capsule 300 mg  300 mg Oral TID Berle Mull, MD   300 mg at 08/28/13 1002  . insulin aspart (novoLOG) injection 0-24 Units  0-24 Units Subcutaneous Q4H Grace Isaac, MD   2 Units at 08/27/13 1348  . ketorolac (TORADOL) 15 MG/ML injection 15 mg  15 mg Intravenous Q8H Grace Isaac, MD   15 mg at 08/28/13 0640  . lactated ringers infusion   Intravenous Continuous Coolidge Breeze, PA-C 20 mL/hr at 08/28/13 1200    . metoCLOPramide (REGLAN) injection 5 mg  5 mg Intravenous Q8H Grace Isaac, MD      . metoprolol (LOPRESSOR) injection 2.5-5 mg  2.5-5 mg Intravenous Q2H PRN Coolidge Breeze, PA-C      . metoprolol  tartrate (LOPRESSOR) tablet 12.5 mg  12.5 mg Oral BID Coolidge Breeze, PA-C   12.5 mg at 08/27/13 2212   Or  . metoprolol tartrate (LOPRESSOR) 25 mg/10 mL oral suspension 12.5 mg  12.5 mg Per Tube BID Coolidge Breeze, PA-C      . midazolam (VERSED) injection 2 mg  2 mg Intravenous Q1H PRN Coolidge Breeze, PA-C      . morphine 2 MG/ML injection 2-5 mg  2-5 mg Intravenous Q1H PRN Coolidge Breeze, PA-C   2 mg at 08/27/13 0856  . nitroGLYCERIN 0.2 mg/mL in dextrose 5 % infusion  0-100 mcg/min Intravenous Continuous Gina L Collins, PA-C      . ondansetron (ZOFRAN) injection 4 mg  4 mg Intravenous Q6H PRN Coolidge Breeze, PA-C   4 mg at 08/28/13 0915  . oxyCODONE (Oxy IR/ROXICODONE) immediate release tablet 5-10 mg  5-10 mg Oral Q3H PRN Coolidge Breeze, PA-C   5 mg at 08/28/13 1151  . pantoprazole (PROTONIX) EC tablet 40 mg  40 mg Oral Daily Coolidge Breeze, PA-C   40 mg at 08/28/13 1003  . phenylephrine (NEO-SYNEPHRINE) 20 mg in dextrose 5 % 250 mL infusion  0-100 mcg/min Intravenous Continuous Coolidge Breeze, PA-C   5 mcg/min at 08/27/13 1300  . promethazine (PHENERGAN) injection 12.5 mg  12.5 mg Intravenous Q6H PRN Grace Isaac, MD      . simvastatin (ZOCOR) tablet 40 mg  40 mg Oral QHS Berle Mull, MD   40 mg at 08/27/13 2212  . sodium chloride 0.9 % injection 3 mL  3 mL Intravenous Q12H Gina L Collins, PA-C   3 mL at 08/28/13 1003  . sodium chloride 0.9 % injection 3 mL  3 mL Intravenous PRN Coolidge Breeze, PA-C        Physical Exam:  General: Alert, oriented x3, no distress Head: no evidence of trauma, PERRL, EOMI, no exophtalmos or lid lag, no myxedema, no xanthelasma; normal ears, nose and oropharynx Neck: normal jugular venous pulsations and no hepatojugular reflux; brisk carotid pulses without delay and no carotid bruits Chest: clear to auscultation - just a little quiet in L base, no signs of consolidation by percussion or palpation, normal fremitus, symmetrical and full respiratory excursions, healthy sternotomy Cardiovascular: normal position and quality of the apical impulse, regular rhythm, normal first and second heart sounds, no murmurs, rubs or gallops Abdomen: no tenderness or distention, no masses by palpation, no abnormal pulsatility or arterial bruits, normal bowel sounds, no hepatosplenomegaly Extremities: no clubbing, cyanosis or edema; 2+ radial, ulnar and brachial pulses bilaterally; 2+ right femoral, posterior tibial and dorsalis pedis pulses; 2+ left femoral, posterior tibial and dorsalis pedis pulses; no subclavian or femoral bruits Neurological: grossly nonfocal   Lab Results: Results for orders placed during the hospital encounter of 08/23/13 (from the past 48 hour(s))  GLUCOSE, CAPILLARY     Status: None   Collection Time    08/26/13  2:02 PM      Result Value Range   Glucose-Capillary 89  70 - 99  mg/dL  GLUCOSE, CAPILLARY     Status: None   Collection Time    08/26/13  3:01 PM      Result Value Range   Glucose-Capillary 91  70 - 99 mg/dL  GLUCOSE, CAPILLARY     Status: None   Collection Time    08/26/13  3:52 PM      Result Value Range   Glucose-Capillary  94  70 - 99 mg/dL  POCT I-STAT 3, BLOOD GAS (G3+)     Status: Abnormal   Collection Time    08/26/13  5:38 PM      Result Value Range   pH, Arterial 7.348 (*) 7.350 - 7.450   pCO2 arterial 41.6  35.0 - 45.0 mmHg   pO2, Arterial 96.0  80.0 - 100.0 mmHg   Bicarbonate 22.7  20.0 - 24.0 mEq/L   TCO2 24  0 - 100 mmol/L   O2 Saturation 97.0     Acid-base deficit 3.0 (*) 0.0 - 2.0 mmol/L   Patient temperature 37.4 C     Collection site ARTERIAL LINE     Drawn by Nurse     Sample type ARTERIAL    CBC     Status: Abnormal   Collection Time    08/26/13  6:35 PM      Result Value Range   WBC 6.1  4.0 - 10.5 K/uL   RBC 3.80 (*) 4.22 - 5.81 MIL/uL   Hemoglobin 11.2 (*) 13.0 - 17.0 g/dL   HCT 32.7 (*) 39.0 - 52.0 %   MCV 86.1  78.0 - 100.0 fL   MCH 29.5  26.0 - 34.0 pg   MCHC 34.3  30.0 - 36.0 g/dL   RDW 14.6  11.5 - 15.5 %   Platelets 93 (*) 150 - 400 K/uL   Comment: CONSISTENT WITH PREVIOUS RESULT  MAGNESIUM     Status: Abnormal   Collection Time    08/26/13  6:35 PM      Result Value Range   Magnesium 3.2 (*) 1.5 - 2.5 mg/dL  CREATININE, SERUM     Status: Abnormal   Collection Time    08/26/13  6:35 PM      Result Value Range   Creatinine, Ser 1.13  0.50 - 1.35 mg/dL   GFR calc non Af Amer 66 (*) >90 mL/min   GFR calc Af Amer 76 (*) >90 mL/min   Comment: (NOTE)     The eGFR has been calculated using the CKD EPI equation.     This calculation has not been validated in all clinical situations.     eGFR's persistently <90 mL/min signify possible Chronic Kidney     Disease.  POCT I-STAT 3, BLOOD GAS (G3+)     Status: Abnormal   Collection Time    08/26/13  6:36 PM      Result Value Range   pH, Arterial 7.322 (*)  7.350 - 7.450   pCO2 arterial 41.3  35.0 - 45.0 mmHg   pO2, Arterial 95.0  80.0 - 100.0 mmHg   Bicarbonate 21.2  20.0 - 24.0 mEq/L   TCO2 22  0 - 100 mmol/L   O2 Saturation 96.0     Acid-base deficit 4.0 (*) 0.0 - 2.0 mmol/L   Patient temperature 37.7 C     Collection site ARTERIAL LINE     Drawn by Nurse     Sample type ARTERIAL    POCT I-STAT, CHEM 8     Status: Abnormal   Collection Time    08/26/13  6:37 PM      Result Value Range   Sodium 142  137 - 147 mEq/L   Potassium 4.5  3.7 - 5.3 mEq/L   Chloride 111  96 - 112 mEq/L   BUN 7  6 - 23 mg/dL   Creatinine, Ser 1.10  0.50 - 1.35 mg/dL   Glucose, Bld 120 (*)  70 - 99 mg/dL   Calcium, Ion 1.18  1.13 - 1.30 mmol/L   TCO2 21  0 - 100 mmol/L   Hemoglobin 10.5 (*) 13.0 - 17.0 g/dL   HCT 31.0 (*) 39.0 - 52.0 %  GLUCOSE, CAPILLARY     Status: Abnormal   Collection Time    08/26/13  7:44 PM      Result Value Range   Glucose-Capillary 118 (*) 70 - 99 mg/dL  GLUCOSE, CAPILLARY     Status: Abnormal   Collection Time    08/27/13 12:36 AM      Result Value Range   Glucose-Capillary 139 (*) 70 - 99 mg/dL  GLUCOSE, CAPILLARY     Status: Abnormal   Collection Time    08/27/13 12:36 AM      Result Value Range   Glucose-Capillary 115 (*) 70 - 99 mg/dL  GLUCOSE, CAPILLARY     Status: Abnormal   Collection Time    08/27/13 12:38 AM      Result Value Range   Glucose-Capillary 135 (*) 70 - 99 mg/dL  CBC     Status: Abnormal   Collection Time    08/27/13  4:00 AM      Result Value Range   WBC 11.4 (*) 4.0 - 10.5 K/uL   RBC 3.69 (*) 4.22 - 5.81 MIL/uL   Hemoglobin 10.9 (*) 13.0 - 17.0 g/dL   HCT 31.6 (*) 39.0 - 52.0 %   MCV 85.6  78.0 - 100.0 fL   MCH 29.5  26.0 - 34.0 pg   MCHC 34.5  30.0 - 36.0 g/dL   RDW 14.5  11.5 - 15.5 %   Platelets 135 (*) 150 - 400 K/uL  BASIC METABOLIC PANEL     Status: Abnormal   Collection Time    08/27/13  4:00 AM      Result Value Range   Sodium 142  137 - 147 mEq/L   Potassium 4.5  3.7 - 5.3  mEq/L   Chloride 108  96 - 112 mEq/L   CO2 20  19 - 32 mEq/L   Glucose, Bld 132 (*) 70 - 99 mg/dL   BUN 11  6 - 23 mg/dL   Creatinine, Ser 1.24  0.50 - 1.35 mg/dL   Calcium 7.8 (*) 8.4 - 10.5 mg/dL   GFR calc non Af Amer 59 (*) >90 mL/min   GFR calc Af Amer 68 (*) >90 mL/min   Comment: (NOTE)     The eGFR has been calculated using the CKD EPI equation.     This calculation has not been validated in all clinical situations.     eGFR's persistently <90 mL/min signify possible Chronic Kidney     Disease.  MAGNESIUM     Status: Abnormal   Collection Time    08/27/13  4:00 AM      Result Value Range   Magnesium 2.6 (*) 1.5 - 2.5 mg/dL  GLUCOSE, CAPILLARY     Status: Abnormal   Collection Time    08/27/13  4:03 AM      Result Value Range   Glucose-Capillary 134 (*) 70 - 99 mg/dL   Comment 1 Documented in Chart     Comment 2 Notify RN    GLUCOSE, CAPILLARY     Status: Abnormal   Collection Time    08/27/13  7:28 AM      Result Value Range   Glucose-Capillary 120 (*) 70 - 99 mg/dL   Comment 1 Notify  RN    GLUCOSE, CAPILLARY     Status: Abnormal   Collection Time    08/27/13 11:52 AM      Result Value Range   Glucose-Capillary 124 (*) 70 - 99 mg/dL   Comment 1 Notify RN    GLUCOSE, CAPILLARY     Status: Abnormal   Collection Time    08/27/13  4:07 PM      Result Value Range   Glucose-Capillary 113 (*) 70 - 99 mg/dL   Comment 1 Notify RN    MAGNESIUM     Status: Abnormal   Collection Time    08/27/13  5:00 PM      Result Value Range   Magnesium 2.6 (*) 1.5 - 2.5 mg/dL  CBC     Status: Abnormal   Collection Time    08/27/13  5:00 PM      Result Value Range   WBC 10.3  4.0 - 10.5 K/uL   RBC 3.53 (*) 4.22 - 5.81 MIL/uL   Hemoglobin 10.4 (*) 13.0 - 17.0 g/dL   HCT 30.5 (*) 39.0 - 52.0 %   MCV 86.4  78.0 - 100.0 fL   MCH 29.5  26.0 - 34.0 pg   MCHC 34.1  30.0 - 36.0 g/dL   RDW 14.9  11.5 - 15.5 %   Platelets 114 (*) 150 - 400 K/uL   Comment: CONSISTENT WITH PREVIOUS  RESULT  CREATININE, SERUM     Status: Abnormal   Collection Time    08/27/13  5:00 PM      Result Value Range   Creatinine, Ser 1.38 (*) 0.50 - 1.35 mg/dL   GFR calc non Af Amer 52 (*) >90 mL/min   GFR calc Af Amer 60 (*) >90 mL/min   Comment: (NOTE)     The eGFR has been calculated using the CKD EPI equation.     This calculation has not been validated in all clinical situations.     eGFR's persistently <90 mL/min signify possible Chronic Kidney     Disease.  POCT I-STAT, CHEM 8     Status: Abnormal   Collection Time    08/27/13  5:45 PM      Result Value Range   Sodium 139  137 - 147 mEq/L   Potassium 4.1  3.7 - 5.3 mEq/L   Chloride 104  96 - 112 mEq/L   BUN 14  6 - 23 mg/dL   Creatinine, Ser 1.40 (*) 0.50 - 1.35 mg/dL   Glucose, Bld 121 (*) 70 - 99 mg/dL   Calcium, Ion 1.15  1.13 - 1.30 mmol/L   TCO2 22  0 - 100 mmol/L   Hemoglobin 10.2 (*) 13.0 - 17.0 g/dL   HCT 30.0 (*) 39.0 - 52.0 %  GLUCOSE, CAPILLARY     Status: Abnormal   Collection Time    08/27/13  8:22 PM      Result Value Range   Glucose-Capillary 117 (*) 70 - 99 mg/dL  GLUCOSE, CAPILLARY     Status: Abnormal   Collection Time    08/28/13 12:48 AM      Result Value Range   Glucose-Capillary 104 (*) 70 - 99 mg/dL  BASIC METABOLIC PANEL     Status: Abnormal   Collection Time    08/28/13  4:00 AM      Result Value Range   Sodium 141  137 - 147 mEq/L   Potassium 4.4  3.7 - 5.3 mEq/L   Chloride 106  96 - 112 mEq/L   CO2 23  19 - 32 mEq/L   Glucose, Bld 108 (*) 70 - 99 mg/dL   BUN 15  6 - 23 mg/dL   Creatinine, Ser 1.48 (*) 0.50 - 1.35 mg/dL   Calcium 7.9 (*) 8.4 - 10.5 mg/dL   GFR calc non Af Amer 48 (*) >90 mL/min   GFR calc Af Amer 55 (*) >90 mL/min   Comment: (NOTE)     The eGFR has been calculated using the CKD EPI equation.     This calculation has not been validated in all clinical situations.     eGFR's persistently <90 mL/min signify possible Chronic Kidney     Disease.  CBC     Status: Abnormal    Collection Time    08/28/13  4:00 AM      Result Value Range   WBC 7.8  4.0 - 10.5 K/uL   RBC 3.39 (*) 4.22 - 5.81 MIL/uL   Hemoglobin 10.0 (*) 13.0 - 17.0 g/dL   HCT 29.3 (*) 39.0 - 52.0 %   MCV 86.4  78.0 - 100.0 fL   MCH 29.5  26.0 - 34.0 pg   MCHC 34.1  30.0 - 36.0 g/dL   RDW 14.9  11.5 - 15.5 %   Platelets 102 (*) 150 - 400 K/uL   Comment: CONSISTENT WITH PREVIOUS RESULT  GLUCOSE, CAPILLARY     Status: Abnormal   Collection Time    08/28/13  8:06 AM      Result Value Range   Glucose-Capillary 101 (*) 70 - 99 mg/dL   Comment 1 Notify RN    GLUCOSE, CAPILLARY     Status: Abnormal   Collection Time    08/28/13 12:04 PM      Result Value Range   Glucose-Capillary 110 (*) 70 - 99 mg/dL   Comment 1 Notify RN      Imaging: Imaging results have been reviewed and Dg Chest Port 1 View  08/28/2013   CLINICAL DATA:  Postop cardiac surgery.  EXAM: PORTABLE CHEST - 1 VIEW  COMPARISON:  08/27/2013  FINDINGS: Since prior study, the Swan-Ganz catheter and mediastinal tube have been removed. The right internal jugular introducer sheath and a left chest tube remaining in place.  Cardiac silhouette is normal in size and configuration. No mediastinal widening.  Mild lung base atelectasis is stable no pulmonary edema. No pneumothorax.  IMPRESSION: 1. Status post cardiac surgery. No evidence of an operative complication. 2. Remaining support apparatus is well positioned. 3. Persistent mild lung base atelectasis, greater the left. No pulmonary edema. No pneumothorax.   Electronically Signed   By: Lajean Manes M.D.   On: 08/28/2013 07:45   Dg Chest Portable 1 View In Am  08/27/2013   CLINICAL DATA:  Postop  EXAM: PORTABLE CHEST - 1 VIEW  COMPARISON:  the previous day's study  FINDINGS: Patient has been extubated and the nasogastric tube removed. Right IJ Swan-Ganz remains in the right proximal right pulmonary artery. Left chest to the mediastinal drain stable. No pneumothorax. Slightly lower lung  volumes with some mild linear opacities in both lung bases left greater than right probably some subsegmental atelectasis. No effusion. Heart size normal. .  IMPRESSION: 1. Extubation with some increase in bibasilar subsegmental atelectasis. 2. Remaining Support hardware stable in position.   Electronically Signed   By: Arne Cleveland M.D.   On: 08/27/2013 07:50   Dg Chest Portable 1 View  08/26/2013   CLINICAL  DATA:  Post CABG  EXAM: PORTABLE CHEST - 1 VIEW  COMPARISON:  Portable exam 1339 hr compared to 08/23/2013  FINDINGS: Tip of endotracheal tube projects 5.7 cm above carinal.  Nasogastric tube extends into stomach.  Right jugular Swan-Ganz catheter tip projects over proximal right pulmonary artery.  Mediastinal drain and left thoracostomy tube present.  Epicardial pacing wires noted.  Normal heart size post CABG.  Mediastinal contours and pulmonary vascularity normal for postoperative patient.  Peribronchial thickening with linear subsegmental atelectasis in the left upper lobe.  No acute infiltrate, pleural effusion or pneumothorax.  No acute osseous findings.  IMPRESSION: Postsurgical changes as above.   Electronically Signed   By: Lavonia Dana M.D.   On: 08/26/2013 14:02    Assessment:  1. Principal Problem: 2.   NSTEMI (non-ST elevated myocardial infarction) 3. Active Problems: 4.   Diverticulitis 5.   CAD, multiple vessel 6.   S/P CABG x 4 7.   Plan:  1. Good postop progress. Suspect nausea is related to analgesics  Time Spent Directly with Patient:  35 minutes  Length of Stay:  LOS: 5 days    CROITORU,MIHAI 08/28/2013, 12:57 PM

## 2013-08-29 ENCOUNTER — Encounter (HOSPITAL_COMMUNITY): Payer: Self-pay | Admitting: Thoracic Surgery (Cardiothoracic Vascular Surgery)

## 2013-08-29 ENCOUNTER — Inpatient Hospital Stay (HOSPITAL_COMMUNITY): Payer: Non-veteran care

## 2013-08-29 LAB — BASIC METABOLIC PANEL
BUN: 16 mg/dL (ref 6–23)
CO2: 23 mEq/L (ref 19–32)
Calcium: 8 mg/dL — ABNORMAL LOW (ref 8.4–10.5)
Chloride: 101 mEq/L (ref 96–112)
Creatinine, Ser: 1.2 mg/dL (ref 0.50–1.35)
GFR calc Af Amer: 71 mL/min — ABNORMAL LOW (ref >90)
GFR calc non Af Amer: 61 mL/min — ABNORMAL LOW (ref >90)
Glucose, Bld: 90 mg/dL (ref 70–99)
Potassium: 3.9 mEq/L (ref 3.7–5.3)
Sodium: 137 mEq/L (ref 137–147)

## 2013-08-29 LAB — GLUCOSE, CAPILLARY
GLUCOSE-CAPILLARY: 111 mg/dL — AB (ref 70–99)
GLUCOSE-CAPILLARY: 77 mg/dL (ref 70–99)
Glucose-Capillary: 86 mg/dL (ref 70–99)
Glucose-Capillary: 93 mg/dL (ref 70–99)
Glucose-Capillary: 93 mg/dL (ref 70–99)
Glucose-Capillary: 80 mg/dL (ref 70–99)

## 2013-08-29 LAB — CBC
HCT: 28.6 % — ABNORMAL LOW (ref 39.0–52.0)
Hemoglobin: 9.9 g/dL — ABNORMAL LOW (ref 13.0–17.0)
MCH: 29.6 pg (ref 26.0–34.0)
MCHC: 34.6 g/dL (ref 30.0–36.0)
MCV: 85.6 fL (ref 78.0–100.0)
Platelets: 105 10*3/uL — ABNORMAL LOW (ref 150–400)
RBC: 3.34 MIL/uL — ABNORMAL LOW (ref 4.22–5.81)
RDW: 14.7 % (ref 11.5–15.5)
WBC: 7.9 10*3/uL (ref 4.0–10.5)

## 2013-08-29 MED ORDER — METOPROLOL TARTRATE 25 MG/10 ML ORAL SUSPENSION
25.0000 mg | Freq: Two times a day (BID) | ORAL | Status: DC
Start: 2013-08-29 — End: 2013-08-31
  Filled 2013-08-29 (×5): qty 10

## 2013-08-29 MED ORDER — SODIUM CHLORIDE 0.9 % IJ SOLN: 3.0000 mL | INTRAMUSCULAR | Status: DC | PRN

## 2013-08-29 MED ORDER — SODIUM CHLORIDE 0.9 % IV SOLN: 250.0000 mL | INTRAVENOUS | Status: DC | PRN

## 2013-08-29 MED ORDER — MAGNESIUM HYDROXIDE 400 MG/5ML PO SUSP: 30.0000 mL | Freq: Every day | ORAL | Status: DC | PRN

## 2013-08-29 MED ORDER — ZOLPIDEM TARTRATE 5 MG PO TABS
2.5000 mg | ORAL_TABLET | Freq: Every evening | ORAL | Status: DC | PRN
  Administered 2013-08-30: 2.5 mg via ORAL
  Filled 2013-08-29: qty 1

## 2013-08-29 MED ORDER — GUAIFENESIN-DM 100-10 MG/5ML PO SYRP
15.0000 mL | ORAL_SOLUTION | ORAL | Status: DC | PRN
Start: 2013-08-29 — End: 2013-08-31

## 2013-08-29 MED ORDER — METOPROLOL TARTRATE 25 MG PO TABS
25.0000 mg | ORAL_TABLET | Freq: Two times a day (BID) | ORAL | Status: DC
  Administered 2013-08-29 – 2013-08-31 (×4): 25 mg via ORAL
  Filled 2013-08-29 (×5): qty 1

## 2013-08-29 MED ORDER — SODIUM CHLORIDE 0.9 % IJ SOLN
3.0000 mL | Freq: Two times a day (BID) | INTRAMUSCULAR | Status: DC
  Administered 2013-08-29 – 2013-08-31 (×4): 3 mL via INTRAVENOUS

## 2013-08-29 MED ORDER — ALPRAZOLAM 0.25 MG PO TABS: 0.2500 mg | ORAL_TABLET | Freq: Four times a day (QID) | ORAL | Status: DC | PRN

## 2013-08-29 MED ORDER — ALUM & MAG HYDROXIDE-SIMETH 200-200-20 MG/5ML PO SUSP: 15.0000 mL | ORAL | Status: DC | PRN

## 2013-08-29 MED ORDER — INSULIN ASPART 100 UNIT/ML ~~LOC~~ SOLN: 0.0000 [IU] | Freq: Three times a day (TID) | SUBCUTANEOUS | Status: DC

## 2013-08-29 MED ORDER — MOVING RIGHT ALONG BOOK
Freq: Once | Status: DC
  Filled 2013-08-29: qty 1

## 2013-08-29 MED ORDER — HYDROCODONE-ACETAMINOPHEN 5-325 MG PO TABS
1.0000 | ORAL_TABLET | ORAL | Status: DC | PRN
  Administered 2013-08-29 – 2013-08-30 (×4): 1 via ORAL
  Administered 2013-08-30: 2 via ORAL
  Administered 2013-08-30 (×3): 1 via ORAL
  Administered 2013-08-31: 2 via ORAL
  Filled 2013-08-29: qty 1
  Filled 2013-08-29: qty 2
  Filled 2013-08-29 (×3): qty 1
  Filled 2013-08-29: qty 2
  Filled 2013-08-29 (×3): qty 1

## 2013-08-29 MED FILL — Heparin Sodium (Porcine) Inj 1000 Unit/ML: INTRAMUSCULAR | Qty: 30 | Status: AC

## 2013-08-29 MED FILL — Mannitol IV Soln 20%: INTRAVENOUS | Qty: 500 | Status: AC

## 2013-08-29 MED FILL — Lidocaine HCl IV Inj 20 MG/ML: INTRAVENOUS | Qty: 5 | Status: AC

## 2013-08-29 MED FILL — Magnesium Sulfate Inj 50%: INTRAMUSCULAR | Qty: 10 | Status: AC

## 2013-08-29 MED FILL — Sodium Bicarbonate IV Soln 8.4%: INTRAVENOUS | Qty: 50 | Status: AC

## 2013-08-29 MED FILL — Sodium Chloride IV Soln 0.9%: INTRAVENOUS | Qty: 2000 | Status: AC

## 2013-08-29 MED FILL — Heparin Sodium (Porcine) Inj 1000 Unit/ML: INTRAMUSCULAR | Qty: 10 | Status: AC

## 2013-08-29 MED FILL — Electrolyte-R (PH 7.4) Solution: INTRAVENOUS | Qty: 3000 | Status: AC

## 2013-08-29 MED FILL — Potassium Chloride Inj 2 mEq/ML: INTRAVENOUS | Qty: 40 | Status: AC

## 2013-08-29 NOTE — Progress Notes (Addendum)
At [phone] while patient was having first post-op bowel movement, patient experienced multiple brief periods of extreme tachycardia (rhythm strip can be found in patient's paper chart).  As tachycardic episodes resolved patient returned to his baseline heart rate between the 60s and 70s, but rhythm remained irregular with ectopy for approximately 10-15 minutes.  Frequent PACs and PVCs noted during the time frame including on 4 beat run of V tach.  Patient did not report feeling any pain, light-headedness, or discomfort during the episode, but did describe a brief "fluttery feeling" in chest.  BP: 136/75; O2 sats: 94% and >.  Rhythm resolved to regular NSR following episode.  Patient assessed and given scheduled 1000 dose of 12.5 mg lopressor early.  Will continue to monitor patient.  Sula Soda M   Patient had a second episode (ST followed by irregular rhythm in the 60s-80s with PVCs and PACs) at 0945 during ambulation on the unit that resolved within 5-10 minutes.  Again, patient was asymsptomatic aside from the fluttery feeling.  Will continue to assess and monitor the patient. Bobette Mo

## 2013-08-29 NOTE — Progress Notes (Signed)
[facility] BRIEF SICU PROGRESS NOTE  3 Days Post-Op  S/P Procedure(s) (LRB): Coronary Artery Bypass Graft times four using left internal mammary artery and right leg greater saphenous vein harvested endoscopically (N/A) INTRAOPERATIVE TRANSESOPHAGEAL ECHOCARDIOGRAM (N/A)   Overall stable day Currently maintaining NSR Awaiting bed on 2W for transfer  Plan: Continue current plan  Kiesha Ensey H 08/29/2013 8:00 PM

## 2013-08-29 NOTE — Progress Notes (Signed)
Feels much better this PM  Nausea resolving, has been able to eat a little  BP 114/79  Pulse 58  Temp(Src) 98.1 F (36.7 C) (Oral)  Resp 15  Ht 5\' 11"  (1.803 m)  Wt 197 lb 8.5 oz (89.6 kg)  BMI 27.56 kg/m2  SpO2 95%   Intake/Output Summary (Last 24 hours) at 08/29/13 1724 Last data filed at 08/29/13 1400  Gross per 24 hour  Intake   1390 ml  Output   1570 ml  Net   -180 ml    Has had some arrhythmias- SVT, 4 beats NSVT, PACs, PVCs- lopressor increased to 25 BID- currently in SR at 56  Will go ahead and put in transfer orders for 2W

## 2013-08-29 NOTE — Progress Notes (Signed)
3 Days Post-Op Procedure(s) (LRB): Coronary Artery Bypass Graft times four using left internal mammary artery and right leg greater saphenous vein harvested endoscopically (N/A) INTRAOPERATIVE TRANSESOPHAGEAL ECHOCARDIOGRAM (N/A) Subjective: C/o nausea Some incisional pain  Objective: Vital signs in last 24 hours: Temp:  [97.5 F (36.4 C)-98.9 F (37.2 C)] 98.3 F (36.8 C) (02/02 0400) Pulse Rate:  [59-79] 66 (02/02 0600) Cardiac Rhythm:  [-] Normal sinus rhythm (02/02 0600) Resp:  [13-24] 17 (02/02 0600) BP: (83-142)/(49-89) 134/85 mmHg (02/02 0600) SpO2:  [91 %-96 %] 93 % (02/02 0600) Weight:  [197 lb 8.5 oz (89.6 kg)] 197 lb 8.5 oz (89.6 kg) (02/02 0500)  Hemodynamic parameters for last 24 hours:    Intake/Output from previous day: 02/01 0701 - 02/02 0700 In: 1450.3 [P.O.:570; I.V.:480.3; IV Piggyback:400] Out: 1230 [Urine:1210; Chest Tube:20] Intake/Output this shift:    General appearance: alert and no distress Neurologic: intact Heart: regular rate and rhythm Lungs: diminished breath sounds bibasilar Abdomen: soft, NT, + BS Wound: clean and dry  Lab Results:  Recent Labs  08/28/13 0400 08/29/13 0407  WBC 7.8 7.9  HGB 10.0* 9.9*  HCT 29.3* 28.6*  PLT 102* 105*   BMET:  Recent Labs  08/28/13 0400 08/29/13 0407  NA 141 137  K 4.4 3.9  CL 106 101  CO2 23 23  GLUCOSE 108* 90  BUN 15 16  CREATININE 1.48* 1.20  CALCIUM 7.9* 8.0*    PT/INR:  Recent Labs  08/26/13 1254  LABPROT 15.8*  INR 1.29   ABG    Component Value Date/Time   PHART 7.322* 08/26/2013 1836   HCO3 21.2 08/26/2013 1836   TCO2 22 08/27/2013 1745   ACIDBASEDEF 4.0* 08/26/2013 1836   O2SAT 96.0 08/26/2013 1836   CBG (last 3)   Recent Labs  08/28/13 1936 08/29/13 0051 08/29/13 0417  GLUCAP 92 77 86    Assessment/Plan: S/P Procedure(s) (LRB): Coronary Artery Bypass Graft times four using left internal mammary artery and right leg greater saphenous vein harvested  endoscopically (N/A) INTRAOPERATIVE TRANSESOPHAGEAL ECHOCARDIOGRAM (N/A) POD # 3 CABG CV- stable  RESP- IS  RENAL- creatinine back to normal- dc foley  GI - no diverticulitis pain  Nausea is limiting progress, has good BS, + flatus  Change back to hydrocodone- which he was taking preop  Ambulate as tolerated   LOS: 6 days    Adrian [name] C 08/29/2013

## 2013-08-30 LAB — BASIC METABOLIC PANEL
BUN: 11 mg/dL (ref 6–23)
CHLORIDE: 105 meq/L (ref 96–112)
CO2: 22 mEq/L (ref 19–32)
Calcium: 7.9 mg/dL — ABNORMAL LOW (ref 8.4–10.5)
Creatinine, Ser: 1 mg/dL (ref 0.50–1.35)
GFR calc Af Amer: 89 mL/min — ABNORMAL LOW (ref >90)
GFR, EST NON AFRICAN AMERICAN: 76 mL/min — AB (ref >90)
GLUCOSE: 84 mg/dL (ref 70–99)
POTASSIUM: 3.8 meq/L (ref 3.7–5.3)
SODIUM: 142 meq/L (ref 137–147)

## 2013-08-30 LAB — CBC
HCT: 28.9 % — ABNORMAL LOW (ref 39.0–52.0)
Hemoglobin: 10 g/dL — ABNORMAL LOW (ref 13.0–17.0)
MCH: 29.7 pg (ref 26.0–34.0)
MCHC: 34.6 g/dL (ref 30.0–36.0)
MCV: 85.8 fL (ref 78.0–100.0)
Platelets: 141 10*3/uL — ABNORMAL LOW (ref 150–400)
RBC: 3.37 MIL/uL — ABNORMAL LOW (ref 4.22–5.81)
RDW: 14.8 % (ref 11.5–15.5)
WBC: 7.8 10*3/uL (ref 4.0–10.5)

## 2013-08-30 LAB — GLUCOSE, CAPILLARY
GLUCOSE-CAPILLARY: 80 mg/dL (ref 70–99)
GLUCOSE-CAPILLARY: 87 mg/dL (ref 70–99)

## 2013-08-30 MED ORDER — AMOXICILLIN-POT CLAVULANATE 875-125 MG PO TABS
1.0000 | ORAL_TABLET | Freq: Two times a day (BID) | ORAL | Status: DC
  Administered 2013-08-30 – 2013-08-31 (×3): 1 via ORAL
  Filled 2013-08-30 (×4): qty 1

## 2013-08-30 MED ORDER — SODIUM CHLORIDE 0.45 % IV SOLN
INTRAVENOUS | Status: DC
  Administered 2013-08-30: 02:00:00 via INTRAVENOUS

## 2013-08-30 MED ORDER — METRONIDAZOLE 500 MG PO TABS
500.0000 mg | ORAL_TABLET | Freq: Three times a day (TID) | ORAL | Status: DC
  Filled 2013-08-30 (×4): qty 1

## 2013-08-30 MED ORDER — CIPROFLOXACIN HCL 500 MG PO TABS
500.0000 mg | ORAL_TABLET | Freq: Two times a day (BID) | ORAL | Status: DC
  Administered 2013-08-30: 500 mg via ORAL
  Filled 2013-08-30 (×3): qty 1

## 2013-08-30 NOTE — Progress Notes (Signed)
Pt transferred to 2W13. Pt ambulated distance to new room, tolerated transfer well. Pt transferred on portable tele, room air. Pt wife present for transfer. Pt belongings sent with patient. Receiving RN, Sharyn Lull, present on arrival to unit, pt placed on receiving units tele box. Receiving RN updated pt due for PO Augmentin, awaiting dose from pharmacy. Will continue to monitor. Adrian [name]

## 2013-08-30 NOTE — Progress Notes (Signed)
4 Days Post-Op Procedure(s) (LRB): Coronary Artery Bypass Graft times four using left internal mammary artery and right leg greater saphenous vein harvested endoscopically (N/A) INTRAOPERATIVE TRANSESOPHAGEAL ECHOCARDIOGRAM (N/A) Subjective: Some nausea this AM Some incisional pain, denies abdominal pain  Objective: Vital signs in last 24 hours: Temp:  [97.9 F (36.6 C)-98.3 F (36.8 C)] 98.3 F (36.8 C) (02/03 0745) Pulse Rate:  [55-71] 69 (02/03 0700) Cardiac Rhythm:  [-] Normal sinus rhythm (02/03 0400) Resp:  [12-20] 16 (02/03 0700) BP: (93-156)/(50-116) 117/65 mmHg (02/03 0600) SpO2:  [91 %-97 %] 95 % (02/03 0700) Weight:  [196 lb 6.9 oz (89.1 kg)] 196 lb 6.9 oz (89.1 kg) (02/03 0600)  Hemodynamic parameters for last 24 hours:    Intake/Output from previous day: 02/02 0701 - 02/03 0700 In: 4481 [P.O.:240; I.V.:845; IV Piggyback:300] Out: 1950 [[id number]] Intake/Output this shift:    General appearance: alert and no distress Neurologic: intact Heart: regular rate and rhythm Lungs: diminished breath sounds bibasilar Abdomen: soft with normal BS Wound: clean and dry  Lab Results:  Recent Labs  08/29/13 0407 08/30/13 0410  WBC 7.9 7.8  HGB 9.9* 10.0*  HCT 28.6* 28.9*  PLT 105* 141*   BMET:  Recent Labs  08/29/13 0407 08/30/13 0410  NA 137 142  K 3.9 3.8  CL 101 105  CO2 23 22  GLUCOSE 90 84  BUN 16 11  CREATININE 1.20 1.00  CALCIUM 8.0* 7.9*    PT/INR: No results found for this basename: LABPROT, INR,  in the last 72 hours ABG    Component Value Date/Time   PHART 7.322* 08/26/2013 1836   HCO3 21.2 08/26/2013 1836   TCO2 22 08/27/2013 1745   ACIDBASEDEF 4.0* 08/26/2013 1836   O2SAT 96.0 08/26/2013 1836   CBG (last 3)   Recent Labs  08/29/13 1653 08/29/13 2136 08/30/13 0751  GLUCAP 111* 80 87    Assessment/Plan: S/P Procedure(s) (LRB): Coronary Artery Bypass Graft times four using left internal mammary artery and right leg greater  saphenous vein harvested endoscopically (N/A) INTRAOPERATIVE TRANSESOPHAGEAL ECHOCARDIOGRAM (N/A) Plan for transfer to step-down: see transfer orders Awaiting bed on 2000  CV- no further ectopy after lopressor dose increased  RESP- pulmonary hygiene for atelectasis  RENAL- stable  GI- diverticulitis- on Unasyn- day 8/14 of antibiotics- will change to Cipro/ flagyl PO  Continue ambulation, SCD for DVT prophylaxis  CBG normal- dc CBg checks   LOS: 7 days    TRUE Garciamartinez C 08/30/2013

## 2013-08-31 ENCOUNTER — Inpatient Hospital Stay (HOSPITAL_COMMUNITY): Payer: Non-veteran care

## 2013-08-31 MED ORDER — POTASSIUM CHLORIDE CRYS ER 20 MEQ PO TBCR
20.0000 meq | EXTENDED_RELEASE_TABLET | Freq: Once | ORAL | Status: AC
  Administered 2013-08-31: 20 meq via ORAL
  Filled 2013-08-31: qty 1

## 2013-08-31 MED ORDER — ENSURE COMPLETE PO LIQD: 237.0000 mL | Freq: Two times a day (BID) | ORAL | Status: DC

## 2013-08-31 MED ORDER — LISINOPRIL 5 MG PO TABS: 5.0000 mg | ORAL_TABLET | Freq: Every day | ORAL | Status: DC

## 2013-08-31 MED ORDER — AMOXICILLIN-POT CLAVULANATE 875-125 MG PO TABS: 1.0000 | ORAL_TABLET | Freq: Two times a day (BID) | ORAL | Status: DC

## 2013-08-31 MED ORDER — ASPIRIN 325 MG PO TBEC: 325.0000 mg | DELAYED_RELEASE_TABLET | Freq: Every day | ORAL | Status: DC

## 2013-08-31 MED ORDER — HYDROCODONE-ACETAMINOPHEN 5-325 MG PO TABS: 0.5000 | ORAL_TABLET | Freq: Four times a day (QID) | ORAL | Status: DC | PRN

## 2013-08-31 MED ORDER — LISINOPRIL 5 MG PO TABS
5.0000 mg | ORAL_TABLET | Freq: Every day | ORAL | Status: DC
  Administered 2013-08-31: 5 mg via ORAL
  Filled 2013-08-31: qty 1

## 2013-08-31 MED ORDER — METOPROLOL TARTRATE 25 MG PO TABS: 25.0000 mg | ORAL_TABLET | Freq: Two times a day (BID) | ORAL | Status: DC

## 2013-08-31 MED ORDER — ENSURE COMPLETE PO LIQD
237.0000 mL | Freq: Two times a day (BID) | ORAL | Status: DC
  Administered 2013-08-31 (×2): 237 mL via ORAL

## 2013-08-31 NOTE — Progress Notes (Addendum)
CARDIAC REHAB PHASE I   PRE:  Rate/Rhythm: 71 SR  BP:  Supine: 148/80  Sitting:   Standing:    SaO2: 94 RA  MODE:  Ambulation: 550 ft   POST:  Rate/Rhythm: 78   BP:  Supine:   Sitting: 122/80  Standing:    SaO2: 93 RA [id number]  Completed discharge education with pt. He voices understanding. Pt agrees to [facility]. CRP in [place], will send referral. Placed [facility] discharge video for pt to watch after walk. Assisted X 1 and used walker to ambulate. Gait steady Pt able to walk 550 feet without c/o. Pt to recliner after walk with call light in reach. We discussed smoking cessation. Pt states that he quit the day he came into the hospital. I did give his tips for quitting information, quit smart class schedule and coaching contact number. He seems committed to quitting. Pt states that he a walker at home to use.  Rodney Langton RN 08/31/2013 11:08 AM

## 2013-08-31 NOTE — Discharge Instructions (Signed)
Activity: 1.May walk up steps                2.No lifting more than ten pounds for four weeks.                 3.No driving for four weeks.                4.Stop any activity that causes chest pain, shortness of breath, dizziness, sweating or excessive weakness.                5.Avoid straining.                6.Continue with your breathing exercises daily.  Diet: Low fat, Low salt and low sugar diet  Wound Care: May shower.  Clean wounds with mild soap and water daily. Contact the office at [phone] if any problems arise.

## 2013-08-31 NOTE — Progress Notes (Signed)
INITIAL NUTRITION ASSESSMENT  DOCUMENTATION CODES Per approved criteria  -Not Applicable   INTERVENTION:  Ensure Complete PO BID, each supplement provides 350 kcal and 13 grams of protein  NUTRITION DIAGNOSIS: Inadequate oral intake related to poor appetite as evidenced by poor intake of meals.   Goal: Intake to meet >90% of estimated nutrition needs.  Monitor:  PO intake, labs, weight trend.  Reason for Assessment: MST  67 y.o. male  Admitting Dx: NSTEMI (non-ST elevated myocardial infarction)  ASSESSMENT: 68 yo man with no prior history of CAD. He was in his usual state of health until the day prior to admission. He developed left lower quadrant pain. He has a history of diverticulitis and recognized the symptoms. He called his MD and a prescription for an antibiotic was called into a pharmacy. When he went to get the antibiotic he became acutely ill and short of breath. He did not have any significant chest pain, pressure or tightness, but a tremendous sense of impending doom. He had generalized weakness. EMS was called. He was tachypneic and tachycardic. He was brought to the ED. A CT showed acute diverticulitis if the sigmoid colon, but no evidence of PE. He had positive enzymes. He was admitted and started on antibiotics. Cardiac catheterization on 1/28 revealed severe 3 vessel CAD and preserved LV function. S/P CABG x 4 on 1/31.  Patient with poor appetite since admission. C/O ongoing nausea. Discussed ways to increase protein and calorie intake with supplements. Patient agreeable to Ensure supplements between meals. He states that he may be going home this afternoon. Patient says that he has gained weight from usual weight due to fluid retention/edema.    Height: Ht Readings from Last 1 Encounters:  08/26/13 5\' 11"  (1.803 m)    Weight: Wt Readings from Last 1 Encounters:  08/31/13 193 lb 4.8 oz (87.68 kg)    Ideal Body Weight: 78.2 kg  % Ideal Body Weight:  112%  Wt Readings from Last 10 Encounters:  08/31/13 193 lb 4.8 oz (87.68 kg)  08/31/13 193 lb 4.8 oz (87.68 kg)  08/31/13 193 lb 4.8 oz (87.68 kg)    Usual Body Weight: 185-190 lb  % Usual Body Weight: 104%  BMI:  Body mass index is 26.97 kg/(m^2).  Estimated Nutritional Needs: Kcal: 2100-2300 Protein: 120-140 gm Fluid: 2.1-2.3 L  Skin: surgical incisions to chest and leg  Diet Order: Cardiac  EDUCATION NEEDS: -Education needs addressed   Intake/Output Summary (Last 24 hours) at 08/31/13 0932 Last data filed at 08/31/13 0440  Gross per 24 hour  Intake      0 ml  Output   2225 ml  Net  -2225 ml    Last BM: 2/3   Labs:   Recent Labs Lab 08/26/13 1835  08/27/13 0400 08/27/13 1700  08/28/13 0400 08/29/13 0407 08/30/13 0410  NA  --   < > 142  --   < > 141 137 142  K  --   < > 4.5  --   < > 4.4 3.9 3.8  CL  --   < > 108  --   < > 106 101 105  CO2  --   --  20  --   --  23 23 22   BUN  --   < > 11  --   < > 15 16 11   CREATININE 1.13  < > 1.24 1.38*  < > 1.48* 1.20 1.00  CALCIUM  --   --  7.8*  --   --  7.9* 8.0* 7.9*  MG 3.2*  --  2.6* 2.6*  --   --   --   --   GLUCOSE  --   < > 132*  --   < > 108* 90 84  < > = values in this interval not displayed.  CBG (last 3)   Recent Labs  08/29/13 2136 08/30/13 0508 08/30/13 0751  GLUCAP 80 80 87    Scheduled Meds: . amoxicillin-clavulanate  1 tablet Oral Q12H  . aspirin EC  325 mg Oral Daily   Or  . aspirin  324 mg Per Tube Daily  . fluticasone  2 spray Each Nare Daily  . gabapentin  300 mg Oral TID  . lisinopril  5 mg Oral Daily  . metoprolol tartrate  25 mg Oral BID   Or  . metoprolol tartrate  25 mg Per Tube BID  . moving right along book   Does not apply Once  . pantoprazole  40 mg Oral Daily  . potassium chloride  20 mEq Oral Once  . simvastatin  40 mg Oral QHS  . sodium chloride  3 mL Intravenous Q12H    Continuous Infusions:   Past Medical History  Diagnosis Date  . Diverticulitis   .  Hyperlipidemia     Past Surgical History  Procedure Laterality Date  . Knee surgery    . Coronary artery bypass graft N/A 08/26/2013    Procedure: Coronary Artery Bypass Graft times four using left internal mammary artery and right leg greater saphenous vein harvested endoscopically;  Surgeon: [name], MD;  Location: [facility];  Service: Open Heart Surgery;  Laterality: N/A;  . Intraoperative transesophageal echocardiogram N/A 08/26/2013    Procedure: INTRAOPERATIVE TRANSESOPHAGEAL ECHOCARDIOGRAM;  Surgeon: [name], MD;  Location: [facility];  Service: Open Heart Surgery;  Laterality: N/A;    Molli Barrows, RD, LDN, [facility] Pager [phone] After Hours Pager [phone]

## 2013-08-31 NOTE — Progress Notes (Addendum)
      [address]       [place] [address]             [phone]        5 Days Post-Op Procedure(s) (LRB): Coronary Artery Bypass Graft times four using left internal mammary artery and right leg greater saphenous vein harvested endoscopically (N/A) INTRAOPERATIVE TRANSESOPHAGEAL ECHOCARDIOGRAM (N/A)  Subjective: Patient denies abdominal pain, nausea, or emesis. Has has had some loose stools.  Objective: Vital signs in last 24 hours: Temp:  [98.3 F (36.8 C)-99.2 F (37.3 C)] 99.2 F (37.3 C) (02/04 0415) Pulse Rate:  [58-76] 75 (02/04 0415) Cardiac Rhythm:  [-] Normal sinus rhythm (02/03 1950) Resp:  [14-25] 20 (02/04 0415) BP: (121-155)/(72-82) 132/79 mmHg (02/04 0415) SpO2:  [90 %-97 %] 90 % (02/04 0415) Weight:  [87.68 kg (193 lb 4.8 oz)-89.086 kg (196 lb 6.4 oz)] 87.68 kg (193 lb 4.8 oz) (02/04 0415)  Pre op weight 88 kg Current Weight  08/31/13 87.68 kg (193 lb 4.8 oz)      Intake/Output from previous day: 02/03 0701 - 02/04 0700 In: 95 [I.V.:95] Out: 2225 [Urine:2225]   Physical Exam:  Cardiovascular: RRR Pulmonary: Clear on right;diminished at left base; no rales, wheezes, or rhonchi. Abdomen: Soft, non tender, bowel sounds present. Extremities: No lower extremity edema. Wounds: Clean and dry.  No erythema or signs of infection.  Lab Results: CBC: Recent Labs  08/29/13 0407 08/30/13 0410  WBC 7.9 7.8  HGB 9.9* 10.0*  HCT 28.6* 28.9*  PLT 105* 141*   BMET:  Recent Labs  08/29/13 0407 08/30/13 0410  NA 137 142  K 3.9 3.8  CL 101 105  CO2 23 22  GLUCOSE 90 84  BUN 16 11  CREATININE 1.20 1.00  CALCIUM 8.0* 7.9*    PT/INR:  Lab Results  Component Value Date   INR 1.29 08/26/2013   INR 1.17 08/24/2013   ABG:  INR: Will add last result for INR, ABG once components are confirmed Will add last 4 CBG results once components are confirmed  Assessment/Plan:  1. CV - S/p NSTEMI.SR this am. On Lopressor 25 bid. Start  Lisinopril. 2.  Pulmonary - CXR shows possible infiltrate LLL, cardiomegaly. Encourage incentive spirometer 3. Remove EPW 4.  Acute blood loss anemia - H and H yesterday stable at 10 and 2.9 5.GI-diverticulitis. Treated previously with Unasyn. No on Augmentin (day 2) 6. Mild thrombocytopenia-platelets up to 141,000 7. Supplement potassium 8. Stop stool softeners 9. Discuss timing of discharge disposition with Dr. Burman Blacksmith MPA-C 08/31/2013,7:39 AM  Feels better.  If able to ambulate ok and eat ok will dc home tomorrow

## 2013-08-31 NOTE — Discharge Summary (Signed)
Physician Discharge Summary       [address]       [place] [address]             [phone]    Patient ID: Adrian [name] MRN: [id number] DOB/AGE: 1946/01/18 69 y.o.  Admit date: 08/23/2013 Discharge date: 08/31/2013  Admission Diagnoses: 1. S/p non Q wave MI 2. Multivessel CAD 3. History of hyperlipidemia 4. History of tobacco abuse 5. History of diverticulitis  Discharge Diagnoses:  1. S/p non Q wave MI 2. Multivessel CAD 3. History of hyperlipidemia 4. History of tobacco abuse 5. History of diverticulitis 6. ABL anemia 7. Mild thrombocytopenia   Procedure (s):  Median sternotomy, extracorporeal circulation, coronary  artery bypass grafting x4 (left internal mammary artery to left anterior descending, saphenous vein graft to obtuse marginal 2, sequential saphenous vein graft to acute marginal and distal right coronary) endoscopic vein harvest right thigh by Dr. [name] on 08/26/2013.  History of Presenting Illness: This is a 68 yo man with no prior history of CAD. He was in his usual state of health until the day prior to admission. He developed left lower quadrant pain. He has a history of diverticulitis and recognized the symptoms. He called his MD and a prescription for an antibiotic was called into a pharmacy. When he went to get the antibiotic he became acutely ill and short of breath. He did not have any significant chest pain, pressure or tightness, but a tremendous sense of impending doom. He had generalized weakness.  EMS was called. He was tachypneic and tachycardic. He was brought to the ED. A CT showed acute diverticulitis if the sigmoid colon, but no evidence of PE. He had positive enzymes. He was admitted and started on antibiotics.  He had cardiac catheterization 08/24/2013 . 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 At the time of evaluation by Dr. [name], the patient denied CP or SOB, but does have some mild LLQ  discomfort. Dr. [name] discussed the need for coronary artery bypass grafting surgery. Duplex carotid US showed no significant carotid artery stenosis bilaterally.Potential risks, benefits, and complications of the surgery were discussed and the patient agreed to proceed with surgery. He underwent a CABG x 4 on 08/26/2013.  Brief Hospital Course:  The patient was extubated the evening of surgery without difficulty. He was weaned off Neo synephrine. He remained afebrile and hemodynamically stable. Gordy Councilman, a line,and chest tubes were removed early in the post operative course. Foley remained for a couple of days for close urine outptut monitoring.He was continued on Unasyn post op for diverticulitis. Lopressor was started and titrated accordingly. He  was volume over loaded and diuresed. He did have nausea initially post op. Phenergan helped relieve this. He had ABL anemia. He did not require a post operative transfusion. His last H and H was up to 10 and 28.9. He also had thrombocytopenia. His platelets went down to 87,000. His last platelet count was up to 141,000.He was weaned off the insulin drip. The patient's HGA1C pre op was 6. The patient was felt surgically stable for transfer from the ICU to [facility] for further convalescence on 08/30/2012. His diverticulitis gradually resolved. Unasyn was discontinued and he was started on Augmentin. This will be continued for one week. He continues to progress with cardiac rehab. He was ambulating on room air. He has been tolerating a diet and has had a bowel movement. Epicardial pacing wires and chest tube sutures will be  removed prior to discharge. Provided the patient remains afebrile, hemodynamically stable, and pending morning round evaluation, he will be surgically stable for discharge on 08/31/2012.    Latest Vital Signs: Blood pressure 116/70, pulse 70, temperature 97.7 F (36.5 C), temperature source Oral, resp. rate 18, height 5\' 11"  (1.803 m), weight  87.68 kg (193 lb 4.8 oz), SpO2 95.00%.  Physical Exam: Cardiovascular: RRR  Pulmonary: Clear on right;diminished at left base; no rales, wheezes, or rhonchi.  Abdomen: Soft, non tender, bowel sounds present.  Extremities: No lower extremity edema.  Wounds: Clean and dry. No erythema or signs of infection.   Discharge Condition:Stable  Recent laboratory studies:  Lab Results  Component Value Date   WBC 7.8 08/30/2013   HGB 10.0* 08/30/2013   HCT 28.9* 08/30/2013   MCV 85.8 08/30/2013   PLT 141* 08/30/2013   Lab Results  Component Value Date   NA 142 08/30/2013   K 3.8 08/30/2013   CL 105 08/30/2013   CO2 22 08/30/2013   CREATININE 1.00 08/30/2013   GLUCOSE 84 08/30/2013      Diagnostic Studies: Dg Chest 2 View  08/31/2013   CLINICAL DATA:  CABG.  Atelectasis.  EXAM: CHEST  2 VIEW  COMPARISON:  DG CHEST 1V PORT dated 08/29/2013; DG CHEST 1V PORT dated 08/28/2013  FINDINGS: Interim removal of right IJ sheath. Mediastinum and hilar structures are normal. Stable borderline cardiomegaly. Prior CABG. Low lung volumes with developing infiltrate left lower lobe. Small left pleural effusion. No pneumothorax. No acute osseous abnormality .  IMPRESSION: 1. Developing infiltrate left lower lobe with new left pleural effusion.  2.  Prior CABG with borderline cardiomegaly.  3. Interim removal of right IJ sheath.   Electronically Signed   By: Marcello Moores  Register   On: 08/31/2013 07:25   Ct Angio Chest Pe W/cm &/or Wo Cm  08/24/2013   CLINICAL DATA:  Shortness of breath  EXAM: CT ANGIOGRAPHY CHEST WITH CONTRAST  TECHNIQUE: Multidetector CT imaging of the chest was performed using the standard protocol during bolus administration of intravenous contrast. Multiplanar CT image reconstructions including MIPs were obtained to evaluate the vascular anatomy.  CONTRAST:  154mL OMNIPAQUE IOHEXOL 350 MG/ML SOLN  COMPARISON:  08/23/2013 radiograph  FINDINGS: The pulmonary arterial branch vessels are patent.  Normal caliber aorta with  scattered atherosclerotic disease.  Upper normal to mildly enlarged heart size with left ventricular hypertrophy. Coronary artery and aortic valvular calcifications. Trace pericardial fluid. No pleural effusion. No intrathoracic lymphadenopathy.  Upper abdominal images show a cyst arising from the right kidney anteriorly.  Lobular attenuation along the right margin of the trachea. Central airways are otherwise patent. Mild linear lung base opacities and mild lower lobe peribronchial thickening. Mild apical scarring.  No acute osseous finding.  Review of the MIP images confirms the above findings.  IMPRESSION: No pulmonary embolism.  Lobular attenuation along the right margin of the trachea is favored to reflect mucous/secretions. Attention on follow-up.  Mild lower lobe peribronchial thickening is nonspecific. Correlate clinically for mild bronchitis. Mild bibasilar opacities, favor atelectasis.  Heart size upper normal to mildly enlarged with left ventricular hypertrophy. Coronary artery calcifications.   Electronically Signed   By: Carlos Levering M.D.   On: 08/24/2013 00:34   Ct Abdomen Pelvis W Contrast  08/24/2013   CLINICAL DATA:  Abdominal pain  EXAM: CT ABDOMEN AND PELVIS WITH CONTRAST  TECHNIQUE: Multidetector CT imaging of the abdomen and pelvis was performed using the standard protocol following bolus administration of  intravenous contrast.  CONTRAST:  126mL OMNIPAQUE IOHEXOL 350 MG/ML SOLN  COMPARISON:  None.  FINDINGS: See separate chest CT report. Small hiatal hernia and mild distal esophageal wall thickening.  Decreased attenuation of the liver is nonspecific post contrast however can be seen in the setting of fatty infiltration. No radiodense gallstones or biliary ductal dilatation. No appreciable abnormality of the pancreas, adrenal glands, spleen.  Left renal atrophy and fullness of the left renal pelvis. No hydroureter. Multiple bilateral renal cysts. Nonobstructing stone on the right. No  hydroureteronephrosis on the right.  Colonic diverticulosis. Inflamed diverticulum along the proximal sigmoid colon with adjacent ill-defined fluid, fat stranding, and fascial thickening. No loculated fluid collection. No free intraperitoneal air. No bowel obstruction. Appendix not identified. No right lower quadrant inflammation. Small duodenal diverticulum. No enlarged lymph nodes.  Moderately distended bladder with mild wall thickening. Small fat containing right inguinal hernia. Prostate gland measures 4.5 cm transverse diameter with questionable TURP defect. Partially imaged right hydrocele.  Scattered atherosclerotic disease of the aorta and branch vessels. Mild infrarenal ectasia up to 2.6 cm.  L5 pars defects. Grade 1 anterolisthesis of L5 and S1. L5-S1 degenerative disc disease.  IMPRESSION: Acute proximal sigmoid colon diverticulitis. No free intraperitoneal air or loculated fluid collection to suggest abscess.  Small hiatal hernia and mild distal esophageal wall thickening, may reflect esophagitis or GERD.  Suspect hepatic steatosis.  Left renal atrophy and bilateral renal cysts. Nonobstructing interpolar/ lower pole right renal stone. Left renal pelvis fullness is likely chronic.  Moderately distended bladder with mild wall thickening. May reflect chronic partial outlet obstruction. Correlate with urinalysis to exclude cystitis.   Electronically Signed   By: Carlos Levering M.D.   On: 08/24/2013 00:41       Discharge Orders   Future Appointments Provider Department Dept Phone   09/07/2013 10:00 AM [facility] [place] [phone]   09/20/2013 12:45 PM [name], MD [facility] [phone]   Future Orders Complete By Expires   Amb Referral to Cardiac Rehabilitation  As directed       Discharge Medications:   Medication List    STOP taking these medications       aspirin 81 MG  chewable tablet  Replaced by:  aspirin 325 MG EC tablet      TAKE these medications       acetaminophen 325 MG tablet  Commonly known as:  TYLENOL  Take 650 mg by mouth every 6 (six) hours as needed for mild pain.     amoxicillin-clavulanate 875-125 MG per tablet  Commonly known as:  AUGMENTIN  Take 1 tablet by mouth every 12 (twelve) hours. For 5 days then stop.     aspirin 325 MG EC tablet  Take 1 tablet (325 mg total) by mouth daily.     feeding supplement (ENSURE COMPLETE) Liqd  Take 237 mLs by mouth 2 (two) times daily between meals. Continue until tolerating diet well     fluticasone 50 MCG/ACT nasal spray  Commonly known as:  FLONASE  Place 2 sprays into both nostrils daily.     gabapentin 300 MG capsule  Commonly known as:  NEURONTIN  Take 300 mg by mouth 3 (three) times daily.     HYDROcodone-acetaminophen 5-325 MG per tablet  Commonly known as:  NORCO/VICODIN  Take 0.5-1 tablets by mouth every 6 (six) hours as needed for moderate pain or severe pain.     lisinopril 5  MG tablet  Commonly known as:  PRINIVIL,ZESTRIL  Take 1 tablet (5 mg total) by mouth daily.     metoprolol tartrate 25 MG tablet  Commonly known as:  LOPRESSOR  Take 1 tablet (25 mg total) by mouth 2 (two) times daily.     simvastatin 40 MG tablet  Commonly known as:  ZOCOR  Take 1 tablet (40 mg total) by mouth at bedtime.     zolpidem 5 MG tablet  Commonly known as:  AMBIEN  Take 2.5 mg by mouth at bedtime as needed for sleep.        The patient has been discharged on:   1.Beta Blocker:  Yes [ x  ]                              No   [   ]                              If No, reason:  2.Ace Inhibitor/ARB: Yes [  x ]                                     No  [    ]                                     If No, reason:  3.Statin:   Yes [  x ]                  No  [   ]                  If No, reason:  4.Ecasa:  Yes  [  x ]                  No   [   ]                  If No,  reason:  Follow Up Appointments: Follow-up Information   Follow up with [name], MD On 09/20/2013. (PA/LAT CXR (to be taken at [facility] which is in the same building as Dr. Leonarda Salon office) on 09/20/2013  at 11:45 am;Appointment time with Dr. [name] is  12:45 pm)    Specialty:  Cardiothoracic Surgery   Contact information:   [address] [place] [address] [phone]       Follow up with [name], MD. (Appointment time is at)    Specialty:  Cardiology   Contact information:   [id number] [place] [address] [phone]       Follow up with CLOWARD,DAVIS L, MD. (Call for a follow up appointment regarding further surveillance of HGA1C 6)    Specialty:  Internal Medicine   Contact information:   [place] [address] [phone]       Follow up with [name], MD On 09/07/2013. (Appointment time with nurse to remove chest tube sutures is at 10:00 am)    Specialty:  Cardiothoracic Surgery   Contact information:   [place] [address] [phone]       Signed: Lars Pinks MPA-C 08/31/2013, 2:50  PM   

## 2013-09-07 ENCOUNTER — Ambulatory Visit (INDEPENDENT_AMBULATORY_CARE_PROVIDER_SITE_OTHER): Payer: Self-pay

## 2013-09-07 DIAGNOSIS — I2510 Atherosclerotic heart disease of native coronary artery without angina pectoris: Secondary | ICD-10-CM

## 2013-09-07 DIAGNOSIS — Z4802 Encounter for removal of sutures: Secondary | ICD-10-CM

## 2013-09-07 NOTE — Progress Notes (Signed)
Removed 2 chest tube sutures with no signs of infection. Patient tolerated well. 

## 2013-09-08 ENCOUNTER — Ambulatory Visit (INDEPENDENT_AMBULATORY_CARE_PROVIDER_SITE_OTHER): Payer: Medicare Other | Admitting: Interventional Cardiology

## 2013-09-08 ENCOUNTER — Encounter: Payer: Self-pay | Admitting: Interventional Cardiology

## 2013-09-08 VITALS — BP 110/64 | HR 62 | Ht 71.0 in | Wt 182.0 lb

## 2013-09-08 DIAGNOSIS — I2510 Atherosclerotic heart disease of native coronary artery without angina pectoris: Secondary | ICD-10-CM

## 2013-09-08 DIAGNOSIS — E785 Hyperlipidemia, unspecified: Secondary | ICD-10-CM

## 2013-09-08 DIAGNOSIS — Z951 Presence of aortocoronary bypass graft: Secondary | ICD-10-CM

## 2013-09-08 NOTE — Patient Instructions (Signed)
Your physician recommends that you schedule a follow-up appointment in: 6 weeks with Dr.Smith  Your physician recommends that you continue on your current medications as directed. Please refer to the Current Medication list given to you today.

## 2013-09-08 NOTE — Progress Notes (Signed)
Patient ID: Adrian [name], male   DOB: January 10, 1946, 68 y.o.   MRN: [id number]    [address] [place], [place]  [address] Phone: [phone] Fax:  [phone]  Date:  09/08/2013   ID:  Adrian [name], DOB [birth year], MRN [id number]  PCP:  [name], MD   ASSESSMENT:  1. Coronary artery disease with recent presentation of non-ST elevation acute coronary syndrome resulting in multivessel coronary artery bypass grafting 2. History of recurring diverticulitis, currently quite S. 3. Hyper lipidemia  PLAN:  1. Cardiac rehabilitation was recommended. The patient states he is unable to afford this and is waiting to see what assistance the [facility] will give him. 2. Continue the current medical regimen and call if angina 3. Gradually increase physical activity   SUBJECTIVE: Adrian [name] is a 68 y.o. male who is very gracious in appreciation of the care he received. He presented with diverticula pain and "a sense of burning in his chest". Cardiac markers were elevated. This resulted in cardiac evaluation with coronary angiography. This resulted in consultation and ultimate coronary bypass grafting.. Recovery has been uneventful. He denies chills and fever. No difficulty with breathing. He denies palpitations. Appetite is normal. He denies dyspnea.  Wt Readings from Last 3 Encounters:  09/08/13 182 lb (82.555 kg)  08/31/13 193 lb 4.8 oz (87.68 kg)  08/31/13 193 lb 4.8 oz (87.68 kg)     Past Medical History  Diagnosis Date  . Diverticulitis   . Hyperlipidemia     Current Outpatient Prescriptions  Medication Sig Dispense Refill  . acetaminophen (TYLENOL) 325 MG tablet Take 650 mg by mouth every 6 (six) hours as needed for mild pain.      [name] aspirin EC 325 MG EC tablet Take 1 tablet (325 mg total) by mouth daily.  30 tablet  0  . feeding supplement, ENSURE COMPLETE, (ENSURE COMPLETE) LIQD Take 237 mLs by mouth 2 (two) times daily between meals. Continue until  tolerating diet well      . gabapentin (NEURONTIN) 300 MG capsule Take 300 mg by mouth 3 (three) times daily.      [name] HYDROcodone-acetaminophen (NORCO/VICODIN) 5-325 MG per tablet Take 0.5-1 tablets by mouth every 6 (six) hours as needed for moderate pain or severe pain.  40 tablet  0  . lisinopril (PRINIVIL,ZESTRIL) 5 MG tablet Take 1 tablet (5 mg total) by mouth daily.  30 tablet  1  . metoprolol tartrate (LOPRESSOR) 25 MG tablet Take 1 tablet (25 mg total) by mouth 2 (two) times daily.  60 tablet  1  . simvastatin (ZOCOR) 40 MG tablet Take 1 tablet (40 mg total) by mouth at bedtime.  30 tablet  6  . zolpidem (AMBIEN) 5 MG tablet Take 2.5 mg by mouth at bedtime as needed for sleep.       No current facility-administered medications for this visit.    Allergies:    Allergies  Allergen Reactions  . Codeine Anxiety  . Hydrocodone Anxiety    Currently taking hydrocodone (2.5-5mg ) and is okay.    Social History:  The patient  reports that he quit smoking about 2 weeks ago. He does not have any smokeless tobacco history on file. He reports that he drinks about 3.5 ounces of alcohol per week. He reports that he does not use illicit drugs.   ROS:  Please see the history of present illness.   No drainage from his surgical sites. No peripheral edema. No palpitations.  All other systems reviewed and negative.   OBJECTIVE: VS:  BP 110/64  Pulse 62  Ht 5\' 11"  (1.803 m)  Wt 182 lb (82.555 kg)  BMI 25.40 kg/m2 Well nourished, well developed, in no acute distress, healthy HEENT: normal Neck: JVD flat. Carotid bruit absent  Cardiac:  normal S1, S2; RRR; no murmur Lungs:  clear to auscultation bilaterally, no wheezing, rhonchi or rales Abd: soft, nontender, no hepatomegaly Ext: Edema absent. Pulses 2+ bilateral Skin: warm and dry Neuro:  CNs 2-12 intact, no focal abnormalities noted  EKG:  Not repeated       Signed, Illene Labrador III, MD 09/08/2013 10:00 AM

## 2013-09-15 ENCOUNTER — Other Ambulatory Visit: Payer: Self-pay

## 2013-09-15 DIAGNOSIS — I2510 Atherosclerotic heart disease of native coronary artery without angina pectoris: Secondary | ICD-10-CM

## 2013-09-16 ENCOUNTER — Ambulatory Visit
Admission: RE | Admit: 2013-09-16 | Discharge: 2013-09-16 | Disposition: A | Discharge status: Home / Self Care | Payer: Non-veteran care | Source: Ambulatory Visit | Attending: Thoracic Surgery (Cardiothoracic Vascular Surgery) | Admitting: Thoracic Surgery (Cardiothoracic Vascular Surgery)

## 2013-09-16 DIAGNOSIS — I2510 Atherosclerotic heart disease of native coronary artery without angina pectoris: Secondary | ICD-10-CM

## 2013-09-19 ENCOUNTER — Ambulatory Visit (INDEPENDENT_AMBULATORY_CARE_PROVIDER_SITE_OTHER): Payer: Self-pay | Admitting: Physician Assistant

## 2013-09-19 VITALS — BP 97/60 | HR 53 | Resp 16 | Ht 71.0 in | Wt 175.0 lb

## 2013-09-19 DIAGNOSIS — Z951 Presence of aortocoronary bypass graft: Secondary | ICD-10-CM

## 2013-09-19 DIAGNOSIS — I2510 Atherosclerotic heart disease of native coronary artery without angina pectoris: Secondary | ICD-10-CM

## 2013-09-19 MED ORDER — ONDANSETRON HCL 4 MG PO TABS: 4.0000 mg | ORAL_TABLET | Freq: Three times a day (TID) | ORAL | Status: DC | PRN

## 2013-09-19 NOTE — Progress Notes (Signed)
  HPI: Patient returns for routine postoperative follow-up having undergone CABG x 4 on 08/26/13. The patient's early postoperative recovery while in the hospital was notable for a flare up of his Diverticulitis requiring treatment with Unasyn and later Augmentin. Since hospital discharge the patient reports he is doing fairly well.  He states that he is experiencing some nausea after use of stool softeners.  He also states that food does not taste right to him.  I explained that this will improve with time.  He has had no further issues with his Diverticulitis.  He is ambulating with minimal difficulty.  He has quit smoking but states it has been challenging and he is hoping he sticks with it.  Finally he complains of dizziness which he attributes to low blood pressure and bradycardia.   Current Outpatient Prescriptions  Medication Sig Dispense Refill  . acetaminophen (TYLENOL) 325 MG tablet Take 650 mg by mouth every 6 (six) hours as needed for mild pain.      [name] aspirin EC 325 MG EC tablet Take 1 tablet (325 mg total) by mouth daily.  30 tablet  0  . feeding supplement, ENSURE COMPLETE, (ENSURE COMPLETE) LIQD Take 237 mLs by mouth 2 (two) times daily between meals. Continue until tolerating diet well      . gabapentin (NEURONTIN) 300 MG capsule Take 300 mg by mouth 3 (three) times daily.      [name] HYDROcodone-acetaminophen (NORCO/VICODIN) 5-325 MG per tablet Take 0.5-1 tablets by mouth every 6 (six) hours as needed for moderate pain or severe pain.  40 tablet  0  . lisinopril (PRINIVIL,ZESTRIL) 5 MG tablet Take 1 tablet (5 mg total) by mouth daily.  30 tablet  1  . metoprolol tartrate (LOPRESSOR) 25 MG tablet Take 1 tablet (25 mg total) by mouth 2 (two) times daily.  60 tablet  1  . simvastatin (ZOCOR) 40 MG tablet Take 1 tablet (40 mg total) by mouth at bedtime.  30 tablet  6  . zolpidem (AMBIEN) 5 MG tablet Take 2.5 mg by mouth at bedtime as needed for sleep.       No current facility-administered  medications for this visit.    Physical Exam:  BP 97/60  Pulse 53  Resp 16  Ht 5\' 11"  (1.803 m)  Wt 175 lb (79.379 kg)  BMI 24.42 kg/m2  SpO2 98%  Gen: no apparent distress Heart: RRR Lungs: CTA bilaterally Skin:  Incisions clean and dry Extremities: no edema appreciated  Diagnostic Tests:  CXR: looks great, no pneumothorax or effusion present  A/P:  1. S/P CABG doing well 2. Bradycardia, borderline blood pressure- will decrease Lopressor to 12.5 mg BID, Lisinopril is at 2.5 mg daily 3. Patient instructed he can begin driving 4. RTC in 4 weeks

## 2013-09-20 ENCOUNTER — Ambulatory Visit: Payer: Self-pay | Admitting: Thoracic Surgery (Cardiothoracic Vascular Surgery)

## 2013-09-21 LAB — PULMONARY FUNCTION TEST
FEF 25-75 Post: 2.98 L/sec
FEF 25-75 Pre: 1.88 L/sec
FEF2575-%CHANGE-POST: 58 %
FEF2575-%PRED-PRE: 68 %
FEF2575-%Pred-Post: 107 %
FEV1-%CHANGE-POST: 14 %
FEV1-%PRED-PRE: 70 %
FEV1-%Pred-Post: 80 %
FEV1-POST: 2.84 L
FEV1-Pre: 2.49 L
FEV1FVC-%CHANGE-POST: 6 %
FEV1FVC-%Pred-Pre: 99 %
FEV6-%CHANGE-POST: 8 %
FEV6-%PRED-PRE: 74 %
FEV6-%Pred-Post: 80 %
FEV6-Post: 3.62 L
FEV6-Pre: 3.32 L
FEV6FVC-%Change-Post: 1 %
FEV6FVC-%Pred-Post: 104 %
FEV6FVC-%Pred-Pre: 103 %
FVC-%Change-Post: 7 %
FVC-%PRED-POST: 76 %
FVC-%Pred-Pre: 71 %
FVC-PRE: 3.39 L
FVC-Post: 3.63 L
POST FEV1/FVC RATIO: 78 %
Post FEV6/FVC ratio: 100 %
Pre FEV1/FVC ratio: 74 %
Pre FEV6/FVC Ratio: 98 %

## 2013-10-04 ENCOUNTER — Encounter: Payer: Self-pay | Admitting: Thoracic Surgery (Cardiothoracic Vascular Surgery)

## 2013-10-04 ENCOUNTER — Ambulatory Visit (INDEPENDENT_AMBULATORY_CARE_PROVIDER_SITE_OTHER): Payer: Self-pay | Admitting: Thoracic Surgery (Cardiothoracic Vascular Surgery)

## 2013-10-04 VITALS — BP 107/66 | HR 55 | Resp 16 | Ht 71.0 in | Wt 175.0 lb

## 2013-10-04 DIAGNOSIS — Z951 Presence of aortocoronary bypass graft: Secondary | ICD-10-CM

## 2013-10-04 DIAGNOSIS — I2510 Atherosclerotic heart disease of native coronary artery without angina pectoris: Secondary | ICD-10-CM

## 2013-10-04 NOTE — Progress Notes (Signed)
HPI:  Adrian [name] returns today for a scheduled postoperative followup visit.  Use 68 year old gentleman who presented with a non-Q-wave MI. He underwent coronary bypass grafting x4 on January 30. Postoperative course was uncomplicated. Since discharge he has continued to do well. He does have some incisional pain, but is not requiring any narcotics. He has noticed steady improvement in his exercise tolerance. He says the food still tastes funny. It has a metallic taste. He was having trouble finding things that he can eat.  He saw the [facility] and was set up for cardiac rehabilitation. When he went to the [facility] yesterday his blood pressure was 80/40 they recommended that he stop lisinopril. He did not stop in one to talk to me first.  Past Medical History  Diagnosis Date  . Diverticulitis   . Hyperlipidemia      Current Outpatient Prescriptions  Medication Sig Dispense Refill  . acetaminophen (TYLENOL) 325 MG tablet Take 650 mg by mouth every 6 (six) hours as needed for mild pain.      [name] aspirin EC 325 MG EC tablet Take 1 tablet (325 mg total) by mouth daily.  30 tablet  0  . feeding supplement, ENSURE COMPLETE, (ENSURE COMPLETE) LIQD Take 237 mLs by mouth 2 (two) times daily between meals. Continue until tolerating diet well      . gabapentin (NEURONTIN) 300 MG capsule Take 300 mg by mouth 3 (three) times daily.      [name] HYDROcodone-acetaminophen (NORCO/VICODIN) 5-325 MG per tablet Take 0.5-1 tablets by mouth every 6 (six) hours as needed for moderate pain or severe pain.  40 tablet  0  . metoprolol tartrate (LOPRESSOR) 25 MG tablet Take 12.5 mg by mouth 2 (two) times daily.      . ondansetron (ZOFRAN) 4 MG tablet Take 1 tablet (4 mg total) by mouth every 8 (eight) hours as needed for nausea or vomiting.  30 tablet  0  . simvastatin (ZOCOR) 40 MG tablet Take 1 tablet (40 mg total) by mouth at bedtime.  30 tablet  6  . zolpidem (AMBIEN) 5 MG tablet Take 2.5 mg by mouth at bedtime as needed for  sleep.       No current facility-administered medications for this visit.    Physical Exam BP 107/66  Pulse 55  Resp 16  Ht 5\' 11"  (1.803 m)  Wt 175 lb (79.379 kg)  BMI 24.42 kg/m2  SpO4 39% 68 year old male in no acute distress Neuro alert and oriented x3 Cardiac regular rate and rhythm normal S1 and S2 Lungs clear with breath sounds bilaterally Sternum stable, incision well-healed Leg incision healing well No peripheral edema  Diagnostic Tests: CHEST 2 VIEW  COMPARISON: 08/31/2013  FINDINGS:  Cardiac shadow is within normal limits. Postsurgical changes are  again noted. The previously seen left-sided pleural effusion and  infiltrate has resolved in the interval. Only minimal residual  scarring in the left base is noted. No new focal abnormality is  seen.  IMPRESSION:  Resolution of previously seen left basilar changes.  Electronically Signed  By: Inez Catalina M.D.  On: 09/16/2013 11:07   Impression: 68 year old gentleman is now about 5 weeks out from coronary bypass grafting. He is doing extremely well. He does still have some incisional discomfort but that is improving. He is not requiring narcotics. His exercise tolerance is improving and he is motivated to start cardiac rehabilitation.  He has not smoked since he left the hospital. I emphasized the importance of continuing abstinence.  He  has been driving for 2 weeks without difficulty. I recommended that he not lift anything over 10 pounds for another week or anything over 20 pounds for 2 more weeks.  His blood pressure is better today than it was at the [facility] yesterday but still is relatively low. He's only on 2.5 mg of lisinopril, I don't think that's doing much for him. Therefore we will stop lisinopril. He should continue the Lopressor 12.5 mg twice a day.  He will continue to be followed by Dr. [name] for his cardiology needs.  I will be happy see him back any time if I can be of any further assistance with  his care

## 2013-10-13 ENCOUNTER — Encounter (HOSPITAL_COMMUNITY)
Admission: RE | Admit: 2013-10-13 | Discharge: 2013-10-13 | Disposition: A | Discharge status: Home / Self Care | Payer: Non-veteran care | Source: Ambulatory Visit | Attending: Interventional Cardiology | Admitting: Interventional Cardiology

## 2013-10-13 DIAGNOSIS — I252 Old myocardial infarction: Secondary | ICD-10-CM | POA: Insufficient documentation

## 2013-10-13 DIAGNOSIS — Z951 Presence of aortocoronary bypass graft: Secondary | ICD-10-CM | POA: Insufficient documentation

## 2013-10-13 DIAGNOSIS — I2510 Atherosclerotic heart disease of native coronary artery without angina pectoris: Secondary | ICD-10-CM | POA: Insufficient documentation

## 2013-10-13 DIAGNOSIS — Z5189 Encounter for other specified aftercare: Secondary | ICD-10-CM | POA: Insufficient documentation

## 2013-10-13 DIAGNOSIS — Z96659 Presence of unspecified artificial knee joint: Secondary | ICD-10-CM | POA: Insufficient documentation

## 2013-10-13 NOTE — Progress Notes (Signed)
Cardiac Rehab Medication Review by a Pharmacist  Does the patient  feel that his/her medications are working for him/her?  yes  Has the patient been experiencing any side effects to the medications prescribed?  no  Does the patient measure his/her own blood pressure or blood glucose at home?  no   Does the patient have any problems obtaining medications due to transportation or finances?   no  Understanding of regimen: excellent Understanding of indications: excellent Potential of compliance: excellent    Pharmacist comments:   Adrian [name] presents in good spirits this morning with his wife. He was able to identify medications on his list that he is no longer taking. I have updated all medications and allergies. Of note, patient has had a difficult past few months with a surgical history that includes a knee replacement and MI s/p CABG. He has been having a metallic taste in his mouth ever since anesthesia but says it is improving. Advised to continue to watch over the next couple months.   Adrian [name]. Adrian [name], PharmD Clinical Pharmacist - Resident Phone: [phone] Pager: [phone] 10/13/2013 8:26 AM     Adrian [name] B 10/13/2013 8:23 AM

## 2013-10-15 ENCOUNTER — Encounter: Payer: Self-pay | Admitting: *Deleted

## 2013-10-17 ENCOUNTER — Encounter (HOSPITAL_COMMUNITY)
Admission: RE | Admit: 2013-10-17 | Discharge: 2013-10-17 | Disposition: A | Discharge status: Home / Self Care | Payer: Non-veteran care | Source: Ambulatory Visit | Attending: Interventional Cardiology | Admitting: Interventional Cardiology

## 2013-10-17 NOTE — Progress Notes (Signed)
Pt started cardiac rehab today.  Pt tolerated light exercise without difficulty. Telemetry rhythm Sinus brady 50 at rest. Maximum heart rate noted at 65 today during exercise. Kennon Portela PAC called and notified. No new orders received. about   Will fax exercise flow sheets to Dr. Thompson Caul office for review with today's ECG tracing. Post exercise blood pressure 98/60. Patient asymptomatic. Recheck blood pressure 105/78. Will continue to monitor the patient throughout  the program.

## 2013-10-19 ENCOUNTER — Encounter (HOSPITAL_COMMUNITY)
Admission: RE | Admit: 2013-10-19 | Discharge: 2013-10-19 | Disposition: A | Discharge status: Home / Self Care | Payer: Non-veteran care | Source: Ambulatory Visit | Attending: Interventional Cardiology | Admitting: Interventional Cardiology

## 2013-10-21 ENCOUNTER — Encounter (HOSPITAL_COMMUNITY)
Admission: RE | Admit: 2013-10-21 | Discharge: 2013-10-21 | Disposition: A | Discharge status: Home / Self Care | Payer: Non-veteran care | Source: Ambulatory Visit | Attending: Interventional Cardiology | Admitting: Interventional Cardiology

## 2013-10-21 NOTE — Progress Notes (Signed)
During cool down for exercise session #3 pt lowest HR was 46.  Pt denies any complaints.  Pt takes metoprolol 12.5mg  twice a day.  Will fax strips over to Dr. [name] to review along with rehab report. Cherre Huger, BSN

## 2013-10-24 ENCOUNTER — Encounter (HOSPITAL_COMMUNITY)
Admission: RE | Admit: 2013-10-24 | Discharge: 2013-10-24 | Disposition: A | Discharge status: Home / Self Care | Payer: Non-veteran care | Source: Ambulatory Visit | Attending: Interventional Cardiology | Admitting: Interventional Cardiology

## 2013-10-26 ENCOUNTER — Ambulatory Visit (INDEPENDENT_AMBULATORY_CARE_PROVIDER_SITE_OTHER): Payer: Medicare Other | Admitting: Interventional Cardiology

## 2013-10-26 ENCOUNTER — Encounter (HOSPITAL_COMMUNITY)
Admission: RE | Admit: 2013-10-26 | Discharge: 2013-10-26 | Disposition: A | Discharge status: Home / Self Care | Payer: Non-veteran care | Source: Ambulatory Visit | Attending: Interventional Cardiology | Admitting: Interventional Cardiology

## 2013-10-26 ENCOUNTER — Encounter: Payer: Self-pay | Admitting: Interventional Cardiology

## 2013-10-26 VITALS — BP 112/68 | HR 56 | Ht 71.0 in | Wt 186.0 lb

## 2013-10-26 DIAGNOSIS — I214 Non-ST elevation (NSTEMI) myocardial infarction: Secondary | ICD-10-CM

## 2013-10-26 DIAGNOSIS — E785 Hyperlipidemia, unspecified: Secondary | ICD-10-CM

## 2013-10-26 DIAGNOSIS — I2510 Atherosclerotic heart disease of native coronary artery without angina pectoris: Secondary | ICD-10-CM | POA: Insufficient documentation

## 2013-10-26 DIAGNOSIS — Z96659 Presence of unspecified artificial knee joint: Secondary | ICD-10-CM | POA: Insufficient documentation

## 2013-10-26 DIAGNOSIS — Z951 Presence of aortocoronary bypass graft: Secondary | ICD-10-CM | POA: Insufficient documentation

## 2013-10-26 DIAGNOSIS — I252 Old myocardial infarction: Secondary | ICD-10-CM | POA: Insufficient documentation

## 2013-10-26 DIAGNOSIS — Z5189 Encounter for other specified aftercare: Secondary | ICD-10-CM | POA: Insufficient documentation

## 2013-10-26 NOTE — Progress Notes (Signed)
Reviewed home exercise with pt today.  Pt plans to continue walking and biking at home and go to [facility] for exercise.  Reviewed THR, pulse, RPE, sign and symptoms, and when to call 911 or MD.  Pt voiced understanding. Alberteen Sam, MA, ACSM RCEP

## 2013-10-26 NOTE — Patient Instructions (Signed)
Your physician recommends that you continue on your current medications as directed. Please refer to the Current Medication list given to you today. Your physician wants you to follow-up in: 4 months You will receive a reminder letter in the mail two months in advance. If you don't receive a letter, please call our office to schedule the follow-up appointment.  

## 2013-10-26 NOTE — Progress Notes (Signed)
Patient ID: Adrian [name], male   DOB: 28-Dec-1945, 68 y.o.   MRN: [id number]    [address] [place], [place]  [address] Phone: [phone] Fax:  [phone]  Date:  10/26/2013   ID:  Adrian [name], DOB [birth year], MRN [id number]  PCP:  [name], MD   ASSESSMENT:  1. Coronary artery disease status post bypass surgery, stable without angina 2. Hyperlipidemia on therapy 3. Diverticular disease, asymptomatic  PLAN:  1. Continue aspirin one per day 2. Clinical followup in 4 months 3. No change in therapy   SUBJECTIVE: Adrian [name] is a 68 y.o. male who denies cardiac complaints. He has a tremor that he feels is related to gabapentin being added. He had a baseline tremor but his been worse since gabapentin. He denies dyspnea. No mechanical complication since surgery. There is some tingling in his fingers bilaterally.   Wt Readings from Last 3 Encounters:  10/26/13 186 lb (84.369 kg)  10/13/13 183 lb 6.8 oz (83.2 kg)  10/04/13 175 lb (79.379 kg)     Past Medical History  Diagnosis Date  . Diverticulitis   . Hyperlipidemia   . CAD, multiple vessel   . NSTEMI (non-ST elevated myocardial infarction) \    Current Outpatient Prescriptions  Medication Sig Dispense Refill  . aspirin EC 325 MG EC tablet Take 1 tablet (325 mg total) by mouth daily.  30 tablet  0  . gabapentin (NEURONTIN) 300 MG capsule Take 300 mg by mouth 3 (three) times daily.      [name] HYDROcodone-acetaminophen (NORCO/VICODIN) 5-325 MG per tablet Take 0.5-1 tablets by mouth every 6 (six) hours as needed for moderate pain or severe pain.  40 tablet  0  . metoprolol tartrate (LOPRESSOR) 25 MG tablet Take 12.5 mg by mouth 2 (two) times daily.      . ondansetron (ZOFRAN) 4 MG tablet Take 4 mg by mouth every 8 (eight) hours as needed.       . simvastatin (ZOCOR) 40 MG tablet Take 1 tablet (40 mg total) by mouth at bedtime.  30 tablet  6  . zolpidem (AMBIEN) 5 MG tablet Take 2.5 mg by mouth  at bedtime as needed for sleep.       No current facility-administered medications for this visit.    Allergies:    Allergies  Allergen Reactions  . Codeine Anxiety  . Hydrocodone Anxiety    Currently taking hydrocodone (2.5-5mg ) and is okay.    Social History:  The patient  reports that he quit smoking about 2 months ago. He does not have any smokeless tobacco history on file. He reports that he drinks about 3.5 ounces of alcohol per week. He reports that he does not use illicit drugs.   ROS:  Please see the history of present illness.      All other systems reviewed and negative.   OBJECTIVE: VS:  BP 112/68  Pulse 56  Ht 5\' 11"  (1.803 m)  Wt 186 lb (84.369 kg)  BMI 25.95 kg/m2 Well nourished, well developed, in no acute distress HEENT: normal Neck: JVD flat. Carotid bruit no bruits  Cardiac:  normal S1, S2; RRR; no murmur Lungs:  clear to auscultation bilaterally, no wheezing, rhonchi or rales Abd: soft, nontender, no hepatomegaly Ext: Edema absent. Pulses 2+ and symmetric Skin: warm and dry Neuro:  CNs 2-12 intact, no focal abnormalities noted  EKG:  Not repeated       Signed, Illene Labrador  III, MD 10/26/2013 10:35 AM

## 2013-10-28 ENCOUNTER — Encounter (HOSPITAL_COMMUNITY): Payer: Non-veteran care

## 2013-10-28 ENCOUNTER — Telehealth (HOSPITAL_COMMUNITY): Payer: Self-pay | Admitting: Internal Medicine

## 2013-10-31 ENCOUNTER — Encounter (HOSPITAL_COMMUNITY)
Admission: RE | Admit: 2013-10-31 | Discharge: 2013-10-31 | Disposition: A | Discharge status: Home / Self Care | Payer: Non-veteran care | Source: Ambulatory Visit | Attending: Interventional Cardiology | Admitting: Interventional Cardiology

## 2013-11-02 ENCOUNTER — Encounter (HOSPITAL_COMMUNITY)
Admission: RE | Admit: 2013-11-02 | Discharge: 2013-11-02 | Disposition: A | Discharge status: Home / Self Care | Payer: Non-veteran care | Source: Ambulatory Visit | Attending: Interventional Cardiology | Admitting: Interventional Cardiology

## 2013-11-04 ENCOUNTER — Telehealth (HOSPITAL_COMMUNITY): Payer: Self-pay | Admitting: Internal Medicine

## 2013-11-04 ENCOUNTER — Encounter (HOSPITAL_COMMUNITY): Payer: Non-veteran care

## 2013-11-07 ENCOUNTER — Encounter (HOSPITAL_COMMUNITY)
Admission: RE | Admit: 2013-11-07 | Discharge: 2013-11-07 | Disposition: A | Discharge status: Home / Self Care | Payer: Non-veteran care | Source: Ambulatory Visit | Attending: Interventional Cardiology | Admitting: Interventional Cardiology

## 2013-11-09 ENCOUNTER — Encounter (HOSPITAL_COMMUNITY)
Admission: RE | Admit: 2013-11-09 | Discharge: 2013-11-09 | Disposition: A | Discharge status: Home / Self Care | Payer: Non-veteran care | Source: Ambulatory Visit | Attending: Interventional Cardiology | Admitting: Interventional Cardiology

## 2013-11-11 ENCOUNTER — Encounter (HOSPITAL_COMMUNITY)
Admission: RE | Admit: 2013-11-11 | Discharge: 2013-11-11 | Disposition: A | Discharge status: Home / Self Care | Payer: Non-veteran care | Source: Ambulatory Visit | Attending: Interventional Cardiology | Admitting: Interventional Cardiology

## 2013-11-14 ENCOUNTER — Encounter (HOSPITAL_COMMUNITY)
Admission: RE | Admit: 2013-11-14 | Discharge: 2013-11-14 | Disposition: A | Discharge status: Home / Self Care | Payer: Non-veteran care | Source: Ambulatory Visit | Attending: Interventional Cardiology | Admitting: Interventional Cardiology

## 2013-11-14 NOTE — Progress Notes (Signed)
Adrian [name] 68 y.o. male Nutrition Note Spoke with pt. Nutrition Survey reviewed with pt. Pt has little room for improvement. Pt is following Step 2 of the Therapeutic Lifestyle Changes diet. Pt reports he has had a decreased appetite since his surgery due to taste alterations, but reports his taste is gradually coming back and he is eating more. Pt reports having had a significant diet change since his MI. Prior to his MI, pt reports eating fried foods and sweets (e.g. cherry cheesecake) frequently. Currently, pt reports he has decreased the amount of saturated fats and trans fats in his diet along with limiting and avoiding sweets. Pt reports he has diverticulitis, which he says interferes with the healthy foods he likes. Pt reports he cannot eat nuts and foods that contains seeds. Pt was educated on ways/tips he can continue to eat healthy while avoiding nuts and seeds. Pt reports he is probably not meeting the recommended daily servings of fruits and vegetables. Pt was educated on ways he can increase his daily fruit and vegetable intake. Pt was also educated on consuming more yogurt as he rarely drinks milk. Pt was screened for pre-diabetes. Pt was educated on what pre-diabetes is and the importance of continuing to exercise and eat healthy. Pt was encouraged to follow up with his health care provider annually about his HbA1c levels. Pt expressed understanding of the information reviewed. Pt aware of nutrition education classes offered and plans on attending nutrition classes.  Nutrition Diagnosis   Food-and nutrition-related knowledge deficit related to lack of exposure to information as related to diagnosis of: ? CVD ? Pre-DM (A1c 6.0) ?    Overweight related to excessive energy intake as evidenced by a BMI of 25.9  Nutrition Intervention   Benefits of adopting Therapeutic Lifestyle Changes discussed when Medficts reviewed.   Pt to attend the Portion Distortion class   Pt to attend the  ?  Nutrition I class                    ? Nutrition II class   Continue client-centered nutrition education by RD, as part of interdisciplinary care.  Goal(s)   Pt to describe the potential benefits of adopting Therapeutic Lifestyle Changes   Pt to describe the benefit of including fruits, vegetables, whole grains, and low-fat dairy products in a heart healthy meal plan.   Monitor and Evaluate progress toward nutrition goal with team. Nutrition Risk:  [place] Intern   11/14/2013 12:51 PM  Derek Mound, M.Ed, RD, LDN, CDE 11/14/2013 3:03 PM

## 2013-11-16 ENCOUNTER — Encounter (HOSPITAL_COMMUNITY)
Admission: RE | Admit: 2013-11-16 | Discharge: 2013-11-16 | Disposition: A | Discharge status: Home / Self Care | Payer: Non-veteran care | Source: Ambulatory Visit | Attending: Interventional Cardiology | Admitting: Interventional Cardiology

## 2013-11-18 ENCOUNTER — Encounter (HOSPITAL_COMMUNITY)
Admission: RE | Admit: 2013-11-18 | Discharge: 2013-11-18 | Disposition: A | Discharge status: Home / Self Care | Payer: Non-veteran care | Source: Ambulatory Visit | Attending: Interventional Cardiology | Admitting: Interventional Cardiology

## 2013-11-21 ENCOUNTER — Encounter (HOSPITAL_COMMUNITY)
Admission: RE | Admit: 2013-11-21 | Discharge: 2013-11-21 | Disposition: A | Discharge status: Home / Self Care | Payer: Non-veteran care | Source: Ambulatory Visit | Attending: Interventional Cardiology | Admitting: Interventional Cardiology

## 2013-11-23 ENCOUNTER — Encounter (HOSPITAL_COMMUNITY)
Admission: RE | Admit: 2013-11-23 | Discharge: 2013-11-23 | Disposition: A | Discharge status: Home / Self Care | Payer: Non-veteran care | Source: Ambulatory Visit | Attending: Interventional Cardiology | Admitting: Interventional Cardiology

## 2013-11-25 ENCOUNTER — Encounter (HOSPITAL_COMMUNITY)
Admission: RE | Admit: 2013-11-25 | Discharge: 2013-11-25 | Disposition: A | Discharge status: Home / Self Care | Payer: Non-veteran care | Source: Ambulatory Visit | Attending: Interventional Cardiology | Admitting: Interventional Cardiology

## 2013-11-25 DIAGNOSIS — I252 Old myocardial infarction: Secondary | ICD-10-CM | POA: Insufficient documentation

## 2013-11-25 DIAGNOSIS — Z5189 Encounter for other specified aftercare: Secondary | ICD-10-CM | POA: Insufficient documentation

## 2013-11-25 DIAGNOSIS — I2510 Atherosclerotic heart disease of native coronary artery without angina pectoris: Secondary | ICD-10-CM | POA: Insufficient documentation

## 2013-11-25 DIAGNOSIS — Z951 Presence of aortocoronary bypass graft: Secondary | ICD-10-CM | POA: Diagnosis not present

## 2013-11-25 DIAGNOSIS — Z96659 Presence of unspecified artificial knee joint: Secondary | ICD-10-CM | POA: Diagnosis not present

## 2013-11-28 ENCOUNTER — Encounter (HOSPITAL_COMMUNITY): Payer: Non-veteran care

## 2013-11-30 ENCOUNTER — Encounter (HOSPITAL_COMMUNITY)
Admission: RE | Admit: 2013-11-30 | Discharge: 2013-11-30 | Disposition: A | Discharge status: Home / Self Care | Payer: Non-veteran care | Source: Ambulatory Visit | Attending: Interventional Cardiology | Admitting: Interventional Cardiology

## 2013-11-30 DIAGNOSIS — Z5189 Encounter for other specified aftercare: Secondary | ICD-10-CM | POA: Diagnosis not present

## 2013-12-02 ENCOUNTER — Encounter (HOSPITAL_COMMUNITY)
Admission: RE | Admit: 2013-12-02 | Discharge: 2013-12-02 | Disposition: A | Discharge status: Home / Self Care | Payer: Non-veteran care | Source: Ambulatory Visit | Attending: Interventional Cardiology | Admitting: Interventional Cardiology

## 2013-12-02 DIAGNOSIS — Z5189 Encounter for other specified aftercare: Secondary | ICD-10-CM | POA: Diagnosis not present

## 2013-12-03 ENCOUNTER — Other Ambulatory Visit: Payer: Self-pay | Admitting: Physician Assistant

## 2013-12-05 ENCOUNTER — Encounter (HOSPITAL_COMMUNITY): Payer: Non-veteran care

## 2013-12-05 ENCOUNTER — Telehealth (HOSPITAL_COMMUNITY): Payer: Self-pay | Admitting: Internal Medicine

## 2013-12-07 ENCOUNTER — Encounter (HOSPITAL_COMMUNITY)
Admission: RE | Admit: 2013-12-07 | Discharge: 2013-12-07 | Disposition: A | Discharge status: Home / Self Care | Payer: Non-veteran care | Source: Ambulatory Visit | Attending: Interventional Cardiology | Admitting: Interventional Cardiology

## 2013-12-07 DIAGNOSIS — Z5189 Encounter for other specified aftercare: Secondary | ICD-10-CM | POA: Diagnosis not present

## 2013-12-08 ENCOUNTER — Other Ambulatory Visit: Payer: Self-pay | Admitting: Physician Assistant

## 2013-12-09 ENCOUNTER — Other Ambulatory Visit: Payer: Self-pay

## 2013-12-09 ENCOUNTER — Encounter (HOSPITAL_COMMUNITY)
Admission: RE | Admit: 2013-12-09 | Discharge: 2013-12-09 | Disposition: A | Discharge status: Home / Self Care | Payer: Non-veteran care | Source: Ambulatory Visit | Attending: Interventional Cardiology | Admitting: Interventional Cardiology

## 2013-12-09 DIAGNOSIS — Z5189 Encounter for other specified aftercare: Secondary | ICD-10-CM | POA: Diagnosis not present

## 2013-12-09 MED ORDER — METOPROLOL TARTRATE 25 MG PO TABS: 12.5000 mg | ORAL_TABLET | Freq: Two times a day (BID) | ORAL | Status: DC

## 2013-12-12 ENCOUNTER — Encounter (HOSPITAL_COMMUNITY)
Admission: RE | Admit: 2013-12-12 | Discharge: 2013-12-12 | Disposition: A | Discharge status: Home / Self Care | Payer: Non-veteran care | Source: Ambulatory Visit | Attending: Interventional Cardiology | Admitting: Interventional Cardiology

## 2013-12-12 DIAGNOSIS — Z5189 Encounter for other specified aftercare: Secondary | ICD-10-CM | POA: Diagnosis not present

## 2013-12-14 ENCOUNTER — Encounter (HOSPITAL_COMMUNITY)
Admission: RE | Admit: 2013-12-14 | Discharge: 2013-12-14 | Disposition: A | Discharge status: Home / Self Care | Payer: Non-veteran care | Source: Ambulatory Visit | Attending: Interventional Cardiology | Admitting: Interventional Cardiology

## 2013-12-14 DIAGNOSIS — Z5189 Encounter for other specified aftercare: Secondary | ICD-10-CM | POA: Diagnosis not present

## 2013-12-16 ENCOUNTER — Encounter (HOSPITAL_COMMUNITY)
Admission: RE | Admit: 2013-12-16 | Discharge: 2013-12-16 | Disposition: A | Discharge status: Home / Self Care | Payer: Non-veteran care | Source: Ambulatory Visit | Attending: Interventional Cardiology | Admitting: Interventional Cardiology

## 2013-12-16 DIAGNOSIS — Z5189 Encounter for other specified aftercare: Secondary | ICD-10-CM | POA: Diagnosis not present

## 2013-12-19 ENCOUNTER — Encounter (HOSPITAL_COMMUNITY): Payer: Non-veteran care

## 2013-12-21 ENCOUNTER — Encounter (HOSPITAL_COMMUNITY)
Admission: RE | Admit: 2013-12-21 | Discharge: 2013-12-21 | Disposition: A | Discharge status: Home / Self Care | Payer: Non-veteran care | Source: Ambulatory Visit | Attending: Interventional Cardiology | Admitting: Interventional Cardiology

## 2013-12-21 DIAGNOSIS — Z5189 Encounter for other specified aftercare: Secondary | ICD-10-CM | POA: Diagnosis not present

## 2013-12-23 ENCOUNTER — Encounter (HOSPITAL_COMMUNITY)
Admission: RE | Admit: 2013-12-23 | Discharge: 2013-12-23 | Disposition: A | Discharge status: Home / Self Care | Payer: Non-veteran care | Source: Ambulatory Visit | Attending: Interventional Cardiology | Admitting: Interventional Cardiology

## 2013-12-23 DIAGNOSIS — Z5189 Encounter for other specified aftercare: Secondary | ICD-10-CM | POA: Diagnosis not present

## 2013-12-26 ENCOUNTER — Encounter (HOSPITAL_COMMUNITY): Payer: Medicare Other

## 2013-12-28 ENCOUNTER — Encounter (HOSPITAL_COMMUNITY)
Admission: RE | Admit: 2013-12-28 | Discharge: 2013-12-28 | Disposition: A | Discharge status: Home / Self Care | Payer: Medicare Other | Source: Ambulatory Visit | Attending: Interventional Cardiology | Admitting: Interventional Cardiology

## 2013-12-28 DIAGNOSIS — Z5189 Encounter for other specified aftercare: Secondary | ICD-10-CM | POA: Insufficient documentation

## 2013-12-28 DIAGNOSIS — Z96659 Presence of unspecified artificial knee joint: Secondary | ICD-10-CM | POA: Insufficient documentation

## 2013-12-28 DIAGNOSIS — I252 Old myocardial infarction: Secondary | ICD-10-CM | POA: Insufficient documentation

## 2013-12-28 DIAGNOSIS — Z951 Presence of aortocoronary bypass graft: Secondary | ICD-10-CM | POA: Insufficient documentation

## 2013-12-28 DIAGNOSIS — I2510 Atherosclerotic heart disease of native coronary artery without angina pectoris: Secondary | ICD-10-CM | POA: Insufficient documentation

## 2013-12-30 ENCOUNTER — Telehealth (HOSPITAL_COMMUNITY): Payer: Self-pay | Admitting: Internal Medicine

## 2013-12-30 ENCOUNTER — Encounter (HOSPITAL_COMMUNITY): Payer: Medicare Other

## 2014-01-02 ENCOUNTER — Encounter (HOSPITAL_COMMUNITY): Admission: RE | Admit: 2014-01-02 | Payer: Medicare Other | Source: Ambulatory Visit

## 2014-01-04 ENCOUNTER — Encounter (HOSPITAL_COMMUNITY): Payer: Medicare Other

## 2014-01-06 ENCOUNTER — Encounter (HOSPITAL_COMMUNITY): Payer: Medicare Other

## 2014-01-09 ENCOUNTER — Encounter (HOSPITAL_COMMUNITY): Payer: Medicare Other

## 2014-01-11 ENCOUNTER — Encounter (HOSPITAL_COMMUNITY): Payer: Medicare Other

## 2014-01-13 ENCOUNTER — Encounter (HOSPITAL_COMMUNITY): Payer: Medicare Other

## 2014-01-16 ENCOUNTER — Encounter (HOSPITAL_COMMUNITY): Payer: Medicare Other

## 2014-01-18 ENCOUNTER — Encounter (HOSPITAL_COMMUNITY): Payer: Medicare Other

## 2014-01-20 ENCOUNTER — Encounter (HOSPITAL_COMMUNITY): Payer: Medicare Other

## 2014-01-23 ENCOUNTER — Encounter (HOSPITAL_COMMUNITY): Payer: Medicare Other

## 2014-01-25 ENCOUNTER — Encounter (HOSPITAL_COMMUNITY): Payer: Medicare Other

## 2014-01-25 ENCOUNTER — Other Ambulatory Visit: Payer: Self-pay | Admitting: *Deleted

## 2014-01-25 MED ORDER — SIMVASTATIN 40 MG PO TABS: 40.0000 mg | ORAL_TABLET | Freq: Every day | ORAL | Status: DC

## 2014-01-30 ENCOUNTER — Encounter (HOSPITAL_COMMUNITY): Payer: Medicare Other

## 2014-02-01 ENCOUNTER — Encounter (HOSPITAL_COMMUNITY): Payer: Medicare Other

## 2014-02-03 ENCOUNTER — Encounter (HOSPITAL_COMMUNITY): Payer: Medicare Other

## 2014-02-06 ENCOUNTER — Encounter (HOSPITAL_COMMUNITY): Payer: Medicare Other

## 2014-02-08 ENCOUNTER — Encounter (HOSPITAL_COMMUNITY): Payer: Medicare Other

## 2014-02-10 ENCOUNTER — Encounter (HOSPITAL_COMMUNITY): Payer: Medicare Other

## 2014-02-13 ENCOUNTER — Ambulatory Visit (INDEPENDENT_AMBULATORY_CARE_PROVIDER_SITE_OTHER): Payer: Medicare Other | Admitting: Interventional Cardiology

## 2014-02-13 ENCOUNTER — Encounter: Payer: Self-pay | Admitting: Interventional Cardiology

## 2014-02-13 ENCOUNTER — Encounter (HOSPITAL_COMMUNITY): Payer: Medicare Other

## 2014-02-13 VITALS — BP 120/76 | HR 55 | Ht 71.0 in | Wt 195.0 lb

## 2014-02-13 DIAGNOSIS — I2510 Atherosclerotic heart disease of native coronary artery without angina pectoris: Secondary | ICD-10-CM

## 2014-02-13 DIAGNOSIS — Z951 Presence of aortocoronary bypass graft: Secondary | ICD-10-CM

## 2014-02-13 DIAGNOSIS — E785 Hyperlipidemia, unspecified: Secondary | ICD-10-CM

## 2014-02-13 NOTE — Patient Instructions (Signed)
Your physician recommends that you continue on your current medications as directed. Please refer to the Current Medication list given to you today.  Your physician wants you to follow-up in: 6 months with Dr.Smith You will receive a reminder letter in the mail two months in advance. If you don't receive a letter, please call our office to schedule the follow-up appointment.  

## 2014-02-13 NOTE — Progress Notes (Signed)
Patient ID: Adrian [name], male   DOB: 1945-11-23, 68 y.o.   MRN: [id number]    [address], [address] [place], [place]  [address] Phone: [phone] Fax:  [phone]  Date:  02/13/2014   ID:  Adrian [name], DOB April 12, 1946, MRN [id number]  PCP:  [name], MD   ASSESSMENT:  1. Coronary artery disease with coronary bypass grafting in January 2015. Asymptomatic with reference to angina 2. Hyperlipidemia, on therapy 3. Chronic low back pain limiting the patient's exertional tolerance  PLAN:  1. Clear to have spinal steroid injections 2. No change in medical regimen 3. Clinical followup in 6 months   SUBJECTIVE: Adrian [name] is a 68 y.o. male who is doing well post bypass surgery. Is not having angina. Musculoskeletal chest pain is resolved. He has not had syncope or other cardiac complaints. Limiting clinical issue now is low back discomfort. He is scheduled to have steroid epidural.   Wt Readings from Last 3 Encounters:  02/13/14 195 lb (88.451 kg)  10/26/13 186 lb (84.369 kg)  10/13/13 183 lb 6.8 oz (83.2 kg)     Past Medical History  Diagnosis Date  . Diverticulitis   . Hyperlipidemia   . CAD, multiple vessel   . NSTEMI (non-ST elevated myocardial infarction) \    Current Outpatient Prescriptions  Medication Sig Dispense Refill  . aspirin EC 325 MG EC tablet Take 1 tablet (325 mg total) by mouth daily.  30 tablet  0  . gabapentin (NEURONTIN) 300 MG capsule Take 300 mg by mouth 3 (three) times daily.      [name] HYDROmorphone (DILAUDID) 2 MG tablet       . metoprolol tartrate (LOPRESSOR) 25 MG tablet Take 0.5 tablets (12.5 mg total) by mouth 2 (two) times daily.  90 tablet  3  . simvastatin (ZOCOR) 40 MG tablet Take 1 tablet (40 mg total) by mouth at bedtime.  30 tablet  6  . zolpidem (AMBIEN) 5 MG tablet Take 2.5 mg by mouth at bedtime as needed for sleep.       No current facility-administered medications for this visit.    Allergies:      Allergies  Allergen Reactions  . Codeine Anxiety  . Hydrocodone Anxiety    Currently taking hydrocodone (2.5-5mg ) and is okay.    Social History:  The patient  reports that he quit smoking about 5 months ago. He does not have any smokeless tobacco history on file. He reports that he drinks about 3.5 ounces of alcohol per week. He reports that he does not use illicit drugs.   ROS:  Please see the history of present illness.   No angina, palpitations, syncope, orthopnea, PND, or dyspnea.   All other systems reviewed and negative.   OBJECTIVE: VS:  BP 120/76  Pulse 55  Ht 5\' 11"  (1.803 m)  Wt 195 lb (88.451 kg)  BMI 27.21 kg/m2 Well nourished, well developed, in no acute distress, healthy HEENT: normal Neck: JVD flat. Carotid bruit absent  Cardiac:  normal S1, S2; RRR; no murmur Lungs:  clear to auscultation bilaterally, no wheezing, rhonchi or rales Abd: soft, nontender, no hepatomegaly Ext: Edema absent. Pulses 2+ Skin: warm and dry Neuro:  CNs 2-12 intact, no focal abnormalities noted  EKG:  Normal       Signed, Illene Labrador III, MD 02/13/2014 12:05 PM

## 2014-02-15 ENCOUNTER — Encounter (HOSPITAL_COMMUNITY): Payer: Medicare Other

## 2014-02-17 ENCOUNTER — Encounter (HOSPITAL_COMMUNITY): Payer: Medicare Other

## 2014-02-23 ENCOUNTER — Other Ambulatory Visit: Payer: Self-pay | Admitting: *Deleted

## 2014-02-23 MED ORDER — SIMVASTATIN 40 MG PO TABS: 40.0000 mg | ORAL_TABLET | Freq: Every day | ORAL | Status: DC

## 2014-02-23 NOTE — Telephone Encounter (Signed)
Rx was sent to pharmacy electronically. 

## 2014-03-07 ENCOUNTER — Telehealth: Payer: Self-pay | Admitting: Interventional Cardiology

## 2014-03-07 NOTE — Telephone Encounter (Signed)
It is okay to hold aspirin as described. He should resume therapy as soon as possible after the procedure when safe.

## 2014-03-07 NOTE — Telephone Encounter (Signed)
pt wife aware.per Dr.Smith It is okay to hold aspirin as described. He should resume therapy as soon as possible after the procedure when safe.pt wife verbalized understanding.

## 2014-03-07 NOTE — Telephone Encounter (Signed)
Will route to Dr.Smith for approval 

## 2014-03-07 NOTE — Telephone Encounter (Signed)
New message     Pt is having a steroid injection in spine.  Can he stop aspirin 7days prior to injection?

## 2014-04-18 ENCOUNTER — Encounter: Payer: Self-pay | Admitting: Physician Assistant

## 2014-04-18 ENCOUNTER — Telehealth: Payer: Self-pay | Admitting: *Deleted

## 2014-04-18 ENCOUNTER — Ambulatory Visit (INDEPENDENT_AMBULATORY_CARE_PROVIDER_SITE_OTHER): Payer: Medicare Other | Admitting: Physician Assistant

## 2014-04-18 VITALS — BP 120/88 | HR 53 | Ht 71.0 in | Wt 197.0 lb

## 2014-04-18 DIAGNOSIS — M791 Myalgia, unspecified site: Secondary | ICD-10-CM

## 2014-04-18 DIAGNOSIS — E785 Hyperlipidemia, unspecified: Secondary | ICD-10-CM

## 2014-04-18 DIAGNOSIS — IMO0001 Reserved for inherently not codable concepts without codable children: Secondary | ICD-10-CM

## 2014-04-18 DIAGNOSIS — I2510 Atherosclerotic heart disease of native coronary artery without angina pectoris: Secondary | ICD-10-CM

## 2014-04-18 LAB — HEPATIC FUNCTION PANEL
ALK PHOS: 67 U/L (ref 39–117)
ALT: 45 U/L (ref 0–53)
AST: 30 U/L (ref 0–37)
Albumin: 4.3 g/dL (ref 3.5–5.2)
BILIRUBIN DIRECT: 0 mg/dL (ref 0.0–0.3)
TOTAL PROTEIN: 8.3 g/dL (ref 6.0–8.3)
Total Bilirubin: 0.8 mg/dL (ref 0.2–1.2)

## 2014-04-18 LAB — CK: Total CK: 157 U/L (ref 7–232)

## 2014-04-18 NOTE — Telephone Encounter (Signed)
pt notified about lab results with verbal understanding  

## 2014-04-18 NOTE — Progress Notes (Signed)
Cardiology Office Note    Date:  04/18/2014   ID:  Adrian [name], DOB 1946/03/12, MRN [id number]  PCP:  [name], MD  Cardiologist:  Dr. [name]     History of Present Illness: Adrian [name] is a 68 y.o. male with a hx of CAD s/p NSTEMI 07/2013 followed by subsequent CABG.  He was last seen by Dr. [name] 01/2014.  Patient recently underwent ESI for spinal stenosis. He has a second injection schedule. He is currently off of aspirin for this. He recently stopped simvastatin because of arthralgias as well as myalgias.  Symptoms dramatically improved off of statin therapy. He will like to try a different drug. The patient denies any chest pain, significant dyspnea, syncope, orthopnea, PND, edema.   Studies:  - LHC (07/2013):  NSTEMI >> prox to mid LAD 95%, mid LAD 40%, OM2 65-75%, mid RCA 99%, EF 60% >> CABG (L-LAD, S-OM2, S-AM/dist RCA) - Dr. [name]  - Echo (8/14):  EF 55-60%, no RWMA  - Carotid US (1/15):  Bilateral ICA 1-39%   Recent Labs/Images: 08/23/2013: Pro B Natriuretic peptide (BNP) 27.4  08/24/2013: HDL Cholesterol by NMR 24*; LDL (calc) 69; TSH 1.126  08/25/2013: ALT 14  08/30/2013: Creatinine 1.00; Hemoglobin 10.0*; Potassium 3.8   Wt Readings from Last 3 Encounters:  02/13/14 195 lb (88.451 kg)  10/26/13 186 lb (84.369 kg)  10/13/13 183 lb 6.8 oz (83.2 kg)     Past Medical History  Diagnosis Date  . Diverticulitis   . Hyperlipidemia   . CAD, multiple vessel   . NSTEMI (non-ST elevated myocardial infarction) \    Current Outpatient Prescriptions  Medication Sig Dispense Refill  . aspirin EC 325 MG EC tablet Take 1 tablet (325 mg total) by mouth daily.  30 tablet  0  . gabapentin (NEURONTIN) 300 MG capsule Take 300 mg by mouth 3 (three) times daily.      [name] HYDROmorphone (DILAUDID) 2 MG tablet       . metoprolol tartrate (LOPRESSOR) 25 MG tablet Take 0.5 tablets (12.5 mg total) by mouth 2 (two) times daily.  90 tablet  3  . simvastatin  (ZOCOR) 40 MG tablet Take 1 tablet (40 mg total) by mouth at bedtime.  90 tablet  1  . zolpidem (AMBIEN) 5 MG tablet Take 2.5 mg by mouth at bedtime as needed for sleep.       No current facility-administered medications for this visit.     Allergies:   Codeine and Hydrocodone   Social History:  The patient  reports that he quit smoking about 7 months ago. He does not have any smokeless tobacco history on file. He reports that he drinks about 3.5 ounces of alcohol per week. He reports that he does not use illicit drugs.   Family History:  The patient's family history includes Heart attack in his father.   ROS:  Please see the history of present illness.       All other systems reviewed and negative.   PHYSICAL EXAM: VS:  BP 120/88  Pulse 53  Ht 5\' 11"  (1.803 m)  Wt 197 lb (89.359 kg)  BMI 27.49 kg/m2 Well nourished, well developed, in no acute distress HEENT: normal Neck: no JVD Cardiac:  normal S1, S2; RRR; no murmur Lungs:  clear to auscultation bilaterally, no wheezing, rhonchi or rales Abd: soft, nontender, no hepatomegaly Ext: no edema Skin: warm and dry Neuro:  CNs 2-12 intact, no focal abnormalities noted  EKG:  Sinus bradycardia, HR 53, normal axis, no ST changes     ASSESSMENT AND PLAN:  1. Atherosclerosis of native coronary artery of native heart without angina pectoris: Stable. No angina. Resume aspirin when able. Continue beta blocker, statin. 2. HLD (hyperlipidemia): Myalgia symptoms improved off of simvastatin. Check LFTs and total CK today. Start Crestor 10 mg daily. Samples given. If he is able to tolerate, arrange followup lipids and LFTs.   Disposition:  FU Dr. [name] in January as planned.   Signed, Versie Starks, MHS 04/18/2014 11:41 AM    [facility] [place], [place], [place]  [address] Phone: [phone]; Fax: [phone]

## 2014-04-18 NOTE — Patient Instructions (Signed)
Your physician has recommended you make the following change in your medication:  1. STOP ZOCOR 2. START CRESTOR 10 MG 1 TABLET EVERY NIGHT  ABOUT 2 WEEKS AFTER STOPPING THE ZOCOR START THE CRESTOR; AFTER TAKING THE CRESTOR FOR ABOUT 4 WEEKS AND YOU ARE TOLERATING THE CRESTOR PLEASE CALL THE OFFICE [phone] TO SCHEDULE TO HAVE FASTING CHOLESTEROL PANEL DONE ABOUT 2 MONTHS AFTER BEING ON THE CRESTOR  WE WILL SEND OUT A REMINDER LETTER IN A COUPLE OF MONTHS FOR YOU TO FOLLOW UP WITH DR. [name] IN 07/2014

## 2014-05-04 ENCOUNTER — Telehealth: Payer: Self-pay | Admitting: Interventional Cardiology

## 2014-05-04 ENCOUNTER — Other Ambulatory Visit: Payer: Self-pay

## 2014-05-04 NOTE — Telephone Encounter (Signed)
New message     Pt is getting muscle cramps on crestor----also pt is vomiting.  Talk to the nurse

## 2014-05-04 NOTE — Telephone Encounter (Signed)
returned pt call. pt sts that he previously took Simvastatin for several years with no complaints. pt sts that a couple of months ago he began to experience muscle cramps all over.pt was instructed by Richardson Dopp, PA to hold Simvastatin for 3 wks, pt sts  muscle cramps resolved. Then start Crestor 10mg  daily. Pt sts that he began taking Crestor on 10/5 and the muscle cramps have returned and they are unbearable.pt sts that he is having GI discomfort and vomiting pt was seen by his pcp yesterday and given anti-nausea medication. Pt denies fever of cough. Adv pt to try to stay hydrated f/u with pcp if vomiting last more than a couple of days. Adv pt I will fwd Dr.Smith a message about his Crestor. Pt agreeable and verbalized understanding.

## 2014-05-05 ENCOUNTER — Telehealth: Payer: Self-pay | Admitting: Interventional Cardiology

## 2014-05-05 NOTE — Telephone Encounter (Signed)
Discontinue Crestor. After 3 weeks start simvastatin 20 mg daily. Fasting liver and lipid panel 6 weeks after starting simvastatin.

## 2014-05-05 NOTE — Telephone Encounter (Signed)
Left message to call back  

## 2014-05-05 NOTE — Telephone Encounter (Signed)
Patient states he has pravastatin 40mg  tabs at home, so he will break them in half when he starts back on them in 3 weeks.

## 2014-05-05 NOTE — Telephone Encounter (Signed)
Follow Up   Pt calling regarding call from yesterday and cholesterol medication he was given. Please call.

## 2014-05-05 NOTE — Telephone Encounter (Signed)
Follow Up    Pt has some dosage questions for Simvastatin. Please call.

## 2014-05-05 NOTE — Telephone Encounter (Signed)
Patient is agreeable to re starting simvastatin 20 mg after 3 weeks. Unsure if he has the meds at home or not. Will call us if he needs a new Rx.

## 2014-06-05 ENCOUNTER — Telehealth: Payer: Self-pay | Admitting: Interventional Cardiology

## 2014-06-05 NOTE — Telephone Encounter (Signed)
Pt sts that he had a vasovagal episode. He was laying flat under a desk with his arms extended trying to reconnect a computer wire.pt sts that he was straining at the time.pt sts that he began to feel light headed and dizzy, pt ck his bp which was 160/90 HR in the 50's. Pt sts that his bp has now returned to normal. Pt sts that he is now doing fine. No further action needed

## 2014-06-05 NOTE — Telephone Encounter (Signed)
New Message  Pt called having cold sweats, nausea, and light headedness. Please call back to discuss these symptoms//sr

## 2014-07-06 ENCOUNTER — Encounter (HOSPITAL_COMMUNITY): Payer: Self-pay | Admitting: Interventional Cardiology

## 2014-08-24 ENCOUNTER — Ambulatory Visit (INDEPENDENT_AMBULATORY_CARE_PROVIDER_SITE_OTHER): Payer: Medicare Other | Admitting: Interventional Cardiology

## 2014-08-24 ENCOUNTER — Encounter: Payer: Self-pay | Admitting: Interventional Cardiology

## 2014-08-24 VITALS — BP 99/62 | HR 60 | Ht 71.0 in | Wt 193.0 lb

## 2014-08-24 DIAGNOSIS — Z951 Presence of aortocoronary bypass graft: Secondary | ICD-10-CM

## 2014-08-24 DIAGNOSIS — E785 Hyperlipidemia, unspecified: Secondary | ICD-10-CM

## 2014-08-24 LAB — LIPID PANEL
Cholesterol: 134 mg/dL (ref 0–200)
HDL: 29.5 mg/dL — AB (ref >39.00)
NonHDL: 104.5
Total CHOL/HDL Ratio: 5
Triglycerides: 299 mg/dL — ABNORMAL HIGH (ref 0.0–149.0)
VLDL: 59.8 mg/dL — ABNORMAL HIGH (ref 0.0–40.0)

## 2014-08-24 LAB — LDL CHOLESTEROL, DIRECT: Direct LDL: 74 mg/dL

## 2014-08-24 LAB — ALT: ALT: 29 U/L (ref 0–53)

## 2014-08-24 NOTE — Patient Instructions (Signed)
Your physician has recommended you make the following change in your medication:  1) STOP Metoprolol  Lab Today: Lipid, Alt  Your physician wants you to follow-up in: 1 year with Dr.Smith You will receive a reminder letter in the mail two months in advance. If you don't receive a letter, please call our office to schedule the follow-up appointment.

## 2014-08-24 NOTE — Progress Notes (Signed)
Patient ID: Adrian [name], male   DOB: 06/14/1946, 69 y.o.   MRN: [id number]    Cardiology Office Note   Date:  08/24/2014   ID:  Adrian [name], DOB Mar 05, 1946, MRN [id number]  PCP:  [name], MD  Cardiologist:   [name], MD   No chief complaint on file.     History of Present Illness: Adrian [name] is a 69 y.o. male who presents for with CABG 1 year ago and now asymptomatic.Will need lumbar surgery at the [facility] medical center. A decrease and statin intensity lead to improvement in muscle aching. He has not limited by lower back pain. His legs feel weak. He feels as though he will need to have a lumbar disc operation sometime soon. As will be done at the [facility]. They will contact us with timing.    Past Medical History  Diagnosis Date  . Diverticulitis   . Hyperlipidemia   . CAD, multiple vessel   . NSTEMI (non-ST elevated myocardial infarction) \    Past Surgical History  Procedure Laterality Date  . Knee surgery    . Coronary artery bypass graft N/A 08/26/2013    Procedure: Coronary Artery Bypass Graft times four using left internal mammary artery and right leg greater saphenous vein harvested endoscopically;  Surgeon: [name], MD;  Location: [place];  Service: Open Heart Surgery;  Laterality: N/A;  . Intraoperative transesophageal echocardiogram N/A 08/26/2013    Procedure: INTRAOPERATIVE TRANSESOPHAGEAL ECHOCARDIOGRAM;  Surgeon: [name], MD;  Location: [place];  Service: Open Heart Surgery;  Laterality: N/A;  . Left heart catheterization with coronary angiogram N/A 08/24/2013    Procedure: LEFT HEART CATHETERIZATION WITH CORONARY ANGIOGRAM;  Surgeon: [name], MD;  Location: [facility] CATH LAB;  Service: Cardiovascular;  Laterality: N/A;     Current Outpatient Prescriptions  Medication Sig Dispense Refill  . simvastatin (ZOCOR) 20 MG tablet Take 20 mg by mouth daily.    [name] aspirin EC 325 MG EC tablet Take 1 tablet  (325 mg total) by mouth daily. 30 tablet 0  . gabapentin (NEURONTIN) 300 MG capsule Take 300 mg by mouth 3 (three) times daily.    [name] HYDROmorphone (DILAUDID) 2 MG tablet Take 2 mg by mouth 3 (three) times daily.     . metoprolol tartrate (LOPRESSOR) 25 MG tablet Take 0.5 tablets (12.5 mg total) by mouth 2 (two) times daily. 90 tablet 3  . zolpidem (AMBIEN) 5 MG tablet Take 2.5 mg by mouth at bedtime as needed for sleep.     No current facility-administered medications for this visit.    Allergies:   Codeine and Hydrocodone    Social History:  The patient  reports that he quit smoking about a year ago. He does not have any smokeless tobacco history on file. He reports that he drinks about 3.5 oz of alcohol per week. He reports that he does not use illicit drugs.   Family History:  The patient's family history includes Heart attack in his father.    ROS:  Please see the history of present illness.   Otherwise, review of systems are positive for leg weakness and back pain. He denies angina. No dyspnea.   All other systems are reviewed and negative.    PHYSICAL EXAM: VS:  BP 99/62 mmHg  Pulse 60  Ht 5\' 11"  (1.803 m)  Wt 193 lb (87.544 kg)  BMI 26.93 kg/m2 , BMI Body mass index is 26.93 kg/(m^2).  GEN: Well nourished, well developed, in no acute distress HEENT: normal Neck: no JVD, carotid bruits, or masses Cardiac: RRR; no murmurs, rubs, or gallops,no edema  Respiratory:  clear to auscultation bilaterally, normal work of breathing GI: soft, nontender, nondistended, + BS MS: no deformity or atrophy Skin: warm and dry, no rash Neuro:  Strength and sensation are intact Psych: euthymic mood, full affect   EKG:  EKG is not ordered today. The ekg ordered today demonstrates    Recent Labs: 08/27/2013: Magnesium 2.6* 08/30/2013: BUN 11; Creatinine 1.00; Hemoglobin 10.0*; Platelets 141*; Potassium 3.8; Sodium 142 04/18/2014: ALT 45    Lipid Panel    Component Value Date/Time   CHOL  119 08/24/2013 0447   TRIG 130 08/24/2013 0447   HDL 24* 08/24/2013 0447   CHOLHDL 5.0 08/24/2013 0447   VLDL 26 08/24/2013 0447   LDLCALC 69 08/24/2013 0447      Wt Readings from Last 3 Encounters:  08/24/14 193 lb (87.544 kg)  04/18/14 197 lb (89.359 kg)  02/13/14 195 lb (88.451 kg)      Other studies Reviewed: Additional studies/ records that were reviewed today include: None Review of the above records demonstrates: None   ASSESSMENT AND PLAN:  1.  Coronary artery disease with coronary bypass grafting one year ago. No interval complications. He denies angina.  2. Hyperlipidemia on reduced simvastatin dose. Higher doses simvastatin causes musculoskeletal syndrome. 3. Relative low blood pressure, we will therefore wean and discontinue metoprolol. 4. Lumbar disc disease with probable requirement for disc surgery sometime this year. He will be cleared and will be at relatively low risk.     Current medicines are reviewed at length with the patient today.  The patient has concerns regarding medicines.Possibilities as little medicine as possible. He inquires about  The following changes have been made:  Wean and discontinue metoprolol. Labs/ tests ordered today include:  fasting lipid panel today  No orders of the defined types were placed in this encounter.     Disposition:   [facility] in 1 Year   Signed, [name], MD  08/24/2014 8:46 AM    [facility] [address], [place], [place]  [address] Phone: [phone]; Fax: [phone]   Gwenlyn Perking, MD

## 2014-11-27 ENCOUNTER — Other Ambulatory Visit: Payer: Self-pay | Admitting: Interventional Cardiology

## 2014-11-29 ENCOUNTER — Other Ambulatory Visit: Payer: Self-pay | Admitting: *Deleted

## 2014-11-30 MED ORDER — SIMVASTATIN 20 MG PO TABS: 20.0000 mg | ORAL_TABLET | Freq: Every day | ORAL | Status: DC

## 2015-01-22 ENCOUNTER — Other Ambulatory Visit: Payer: Self-pay

## 2015-02-21 ENCOUNTER — Encounter: Payer: Self-pay | Admitting: *Deleted

## 2015-07-19 IMAGING — CR DG CHEST 1V PORT
2 series · 2 of 2 positions shown · non-contrast
Comparison: 11/14/2004.

CLINICAL DATA: Short of breath.  Negative for chest pain.

EXAM:
PORTABLE CHEST - 1 VIEW

[AP (1 of 2)]
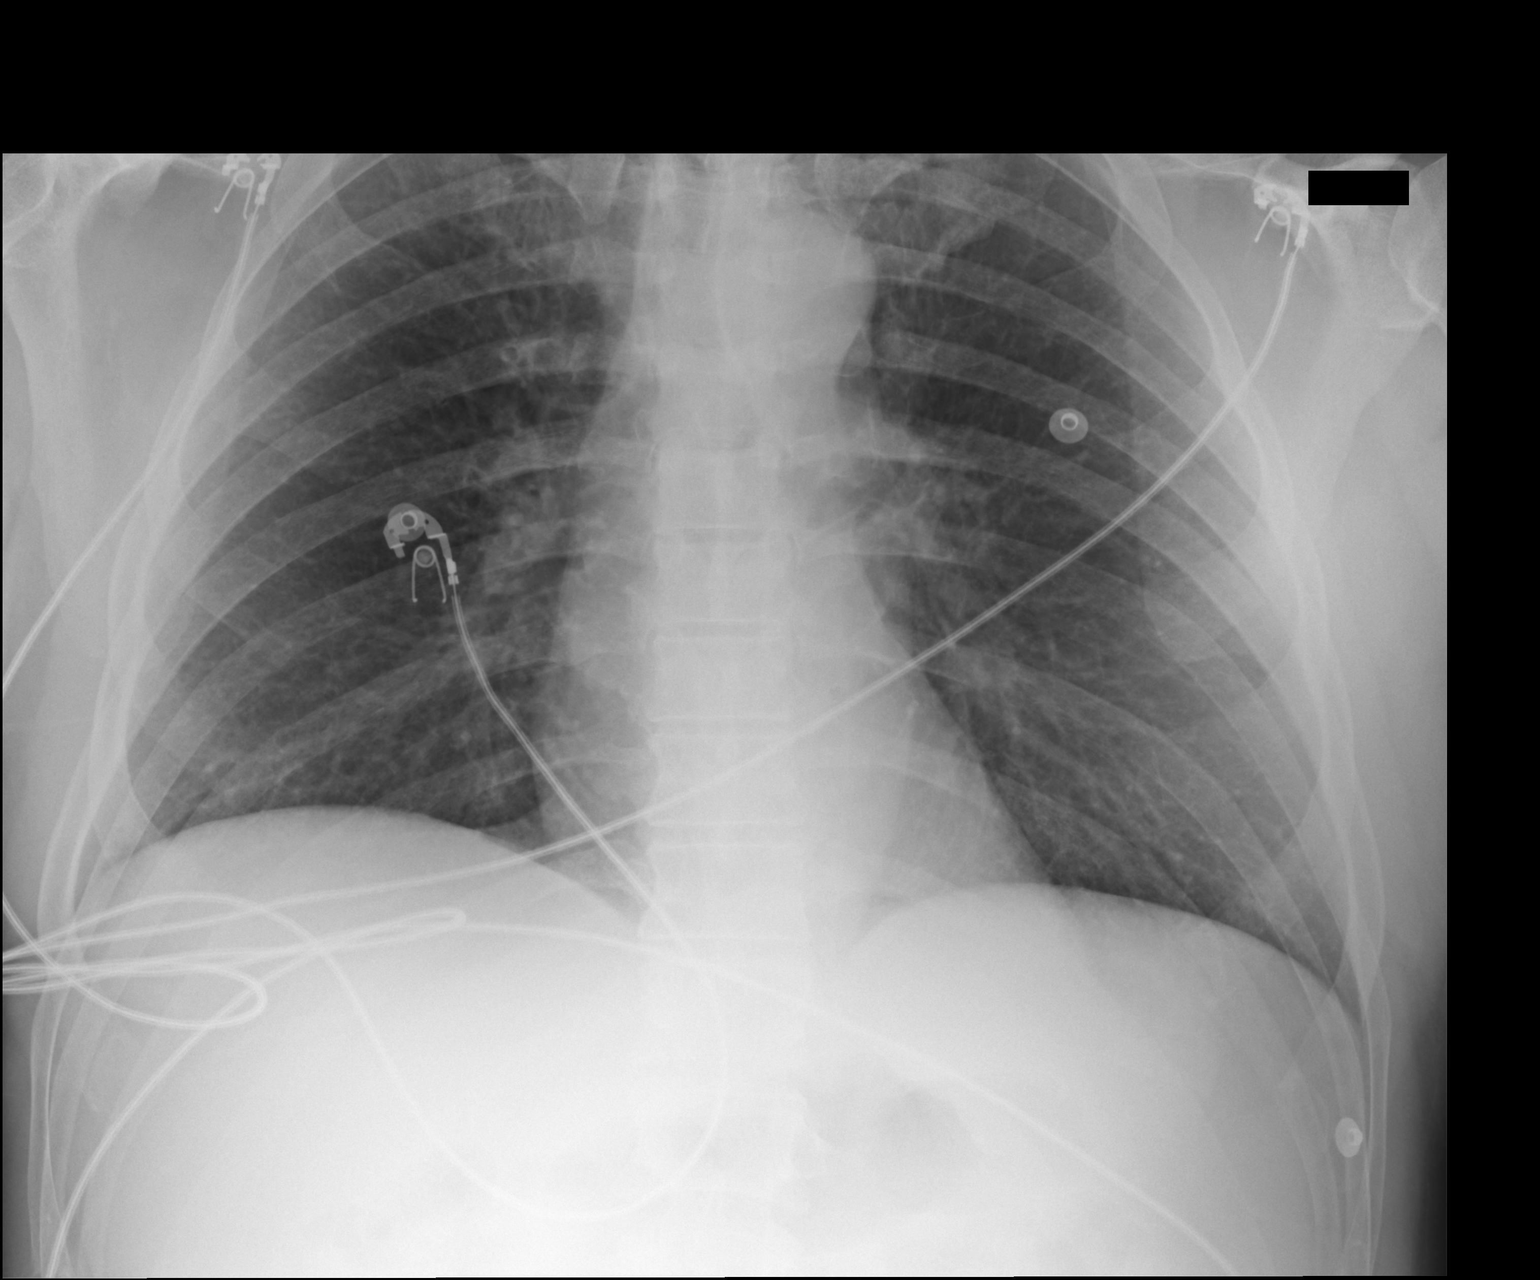

[AP (2 of 2)]
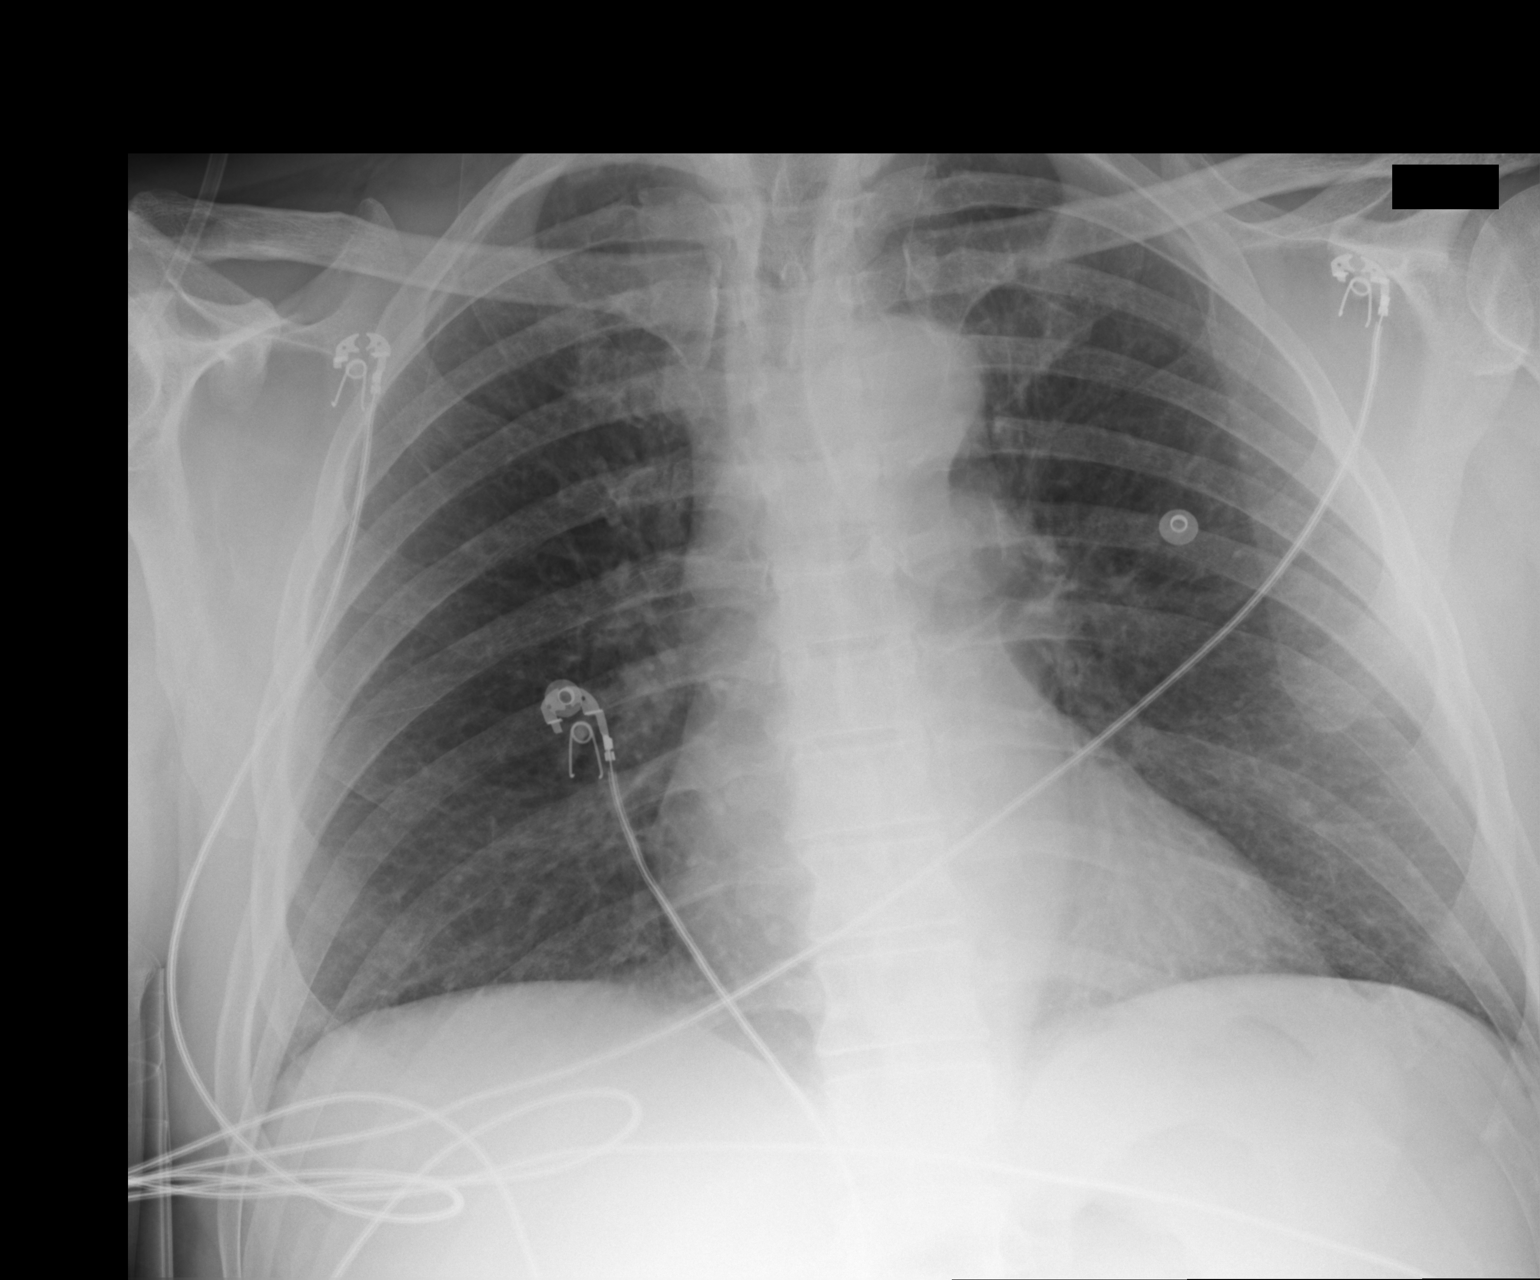

[2 of 2 positions shown; findings below may reference images not displayed]

FINDINGS: Cardiopericardial silhouette within normal limits. Mediastinal
contours normal. Trachea midline. No airspace disease or effusion.
Monitoring leads project over the chest. Mild basilar atelectasis.
IMPRESSION: No active disease.

## 2015-07-22 IMAGING — CR DG CHEST 1V PORT
1 series · 1 of 1 positions shown · non-contrast
Comparison: Portable exam 9009 hr compared to 08/23/2013

CLINICAL DATA: Post CABG

EXAM:
PORTABLE CHEST - 1 VIEW

[AP]
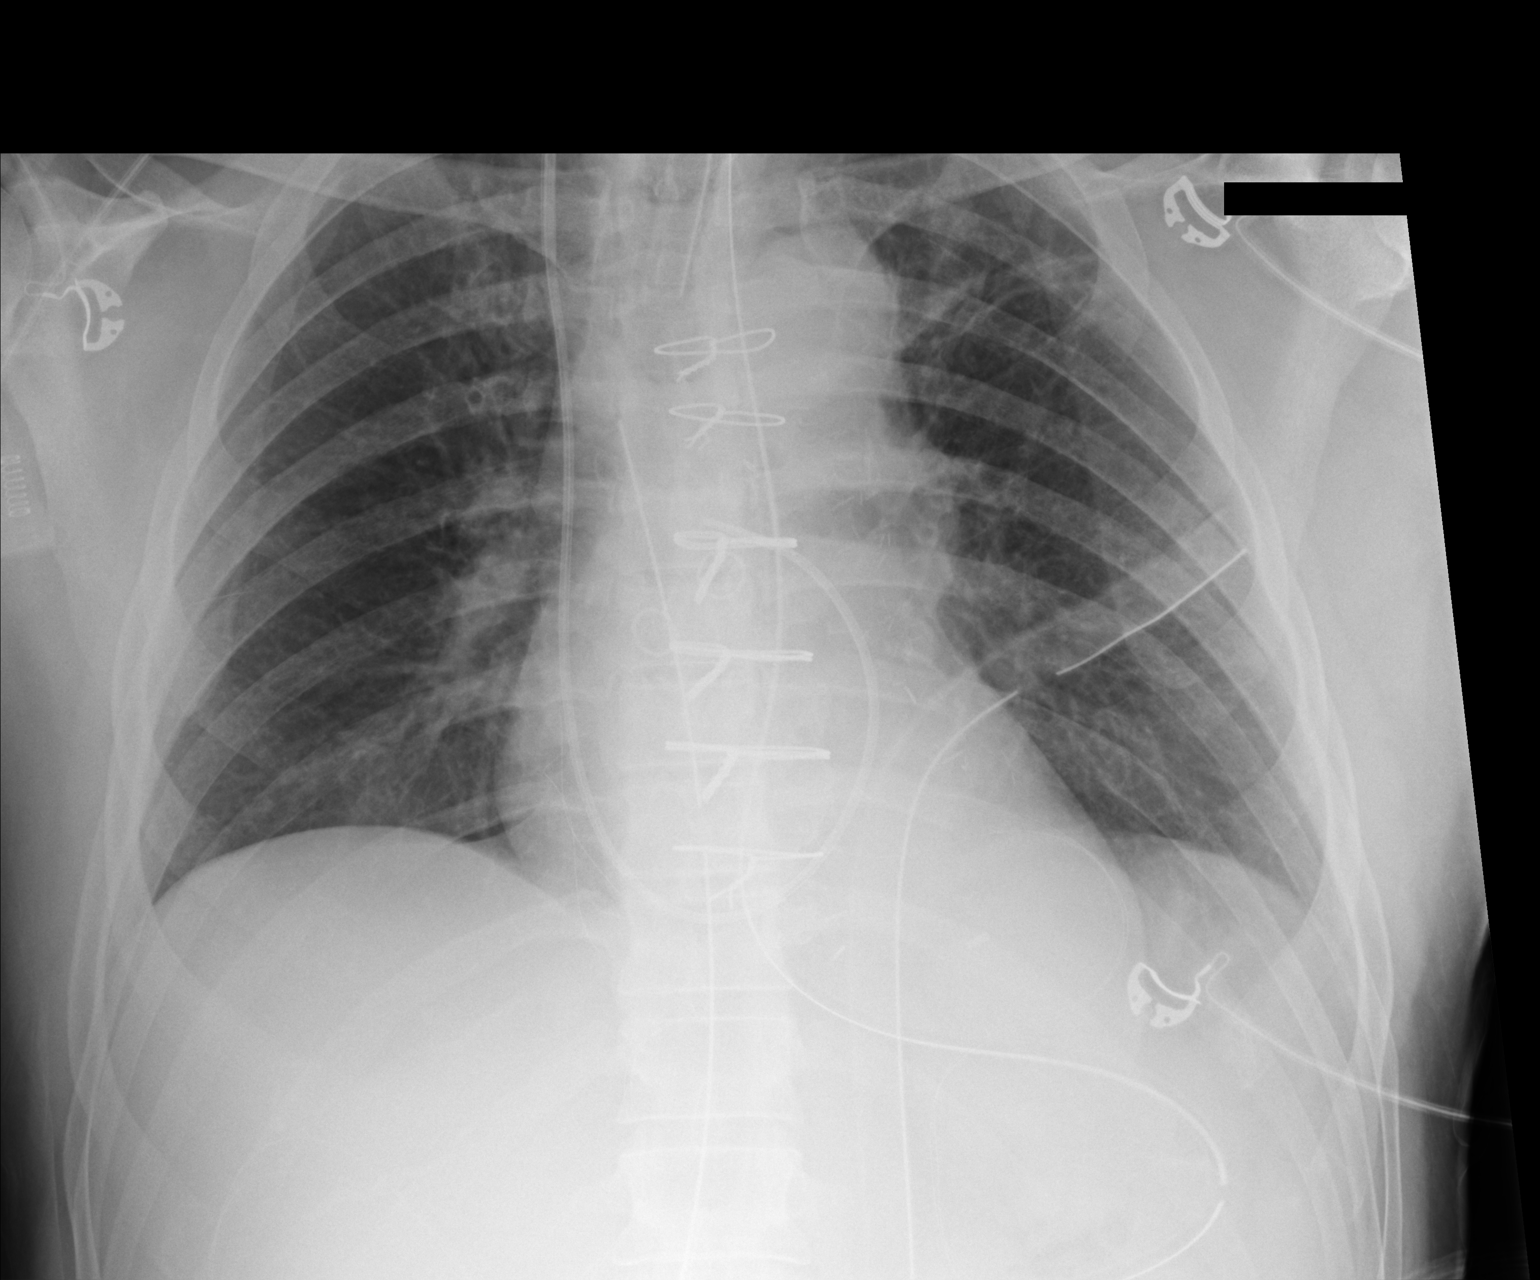

[1 of 1 positions shown; findings below may reference images not displayed]

FINDINGS: Tip of endotracheal tube projects 5.7 cm above carinal.

Nasogastric tube extends into stomach.

Right jugular Swan-Ganz catheter tip projects over proximal right
pulmonary artery.

Mediastinal drain and left thoracostomy tube present.

Epicardial pacing wires noted.

Normal heart size post CABG.

Mediastinal contours and pulmonary vascularity normal for
postoperative patient.

Peribronchial thickening with linear subsegmental atelectasis in the
left upper lobe.

No acute infiltrate, pleural effusion or pneumothorax.

No acute osseous findings.
IMPRESSION: Postsurgical changes as above.

## 2015-07-24 IMAGING — CR DG CHEST 1V PORT
1 series · 1 of 1 positions shown · non-contrast
Comparison: 08/27/2013

CLINICAL DATA: Postop cardiac surgery.

EXAM:
PORTABLE CHEST - 1 VIEW

[AP]
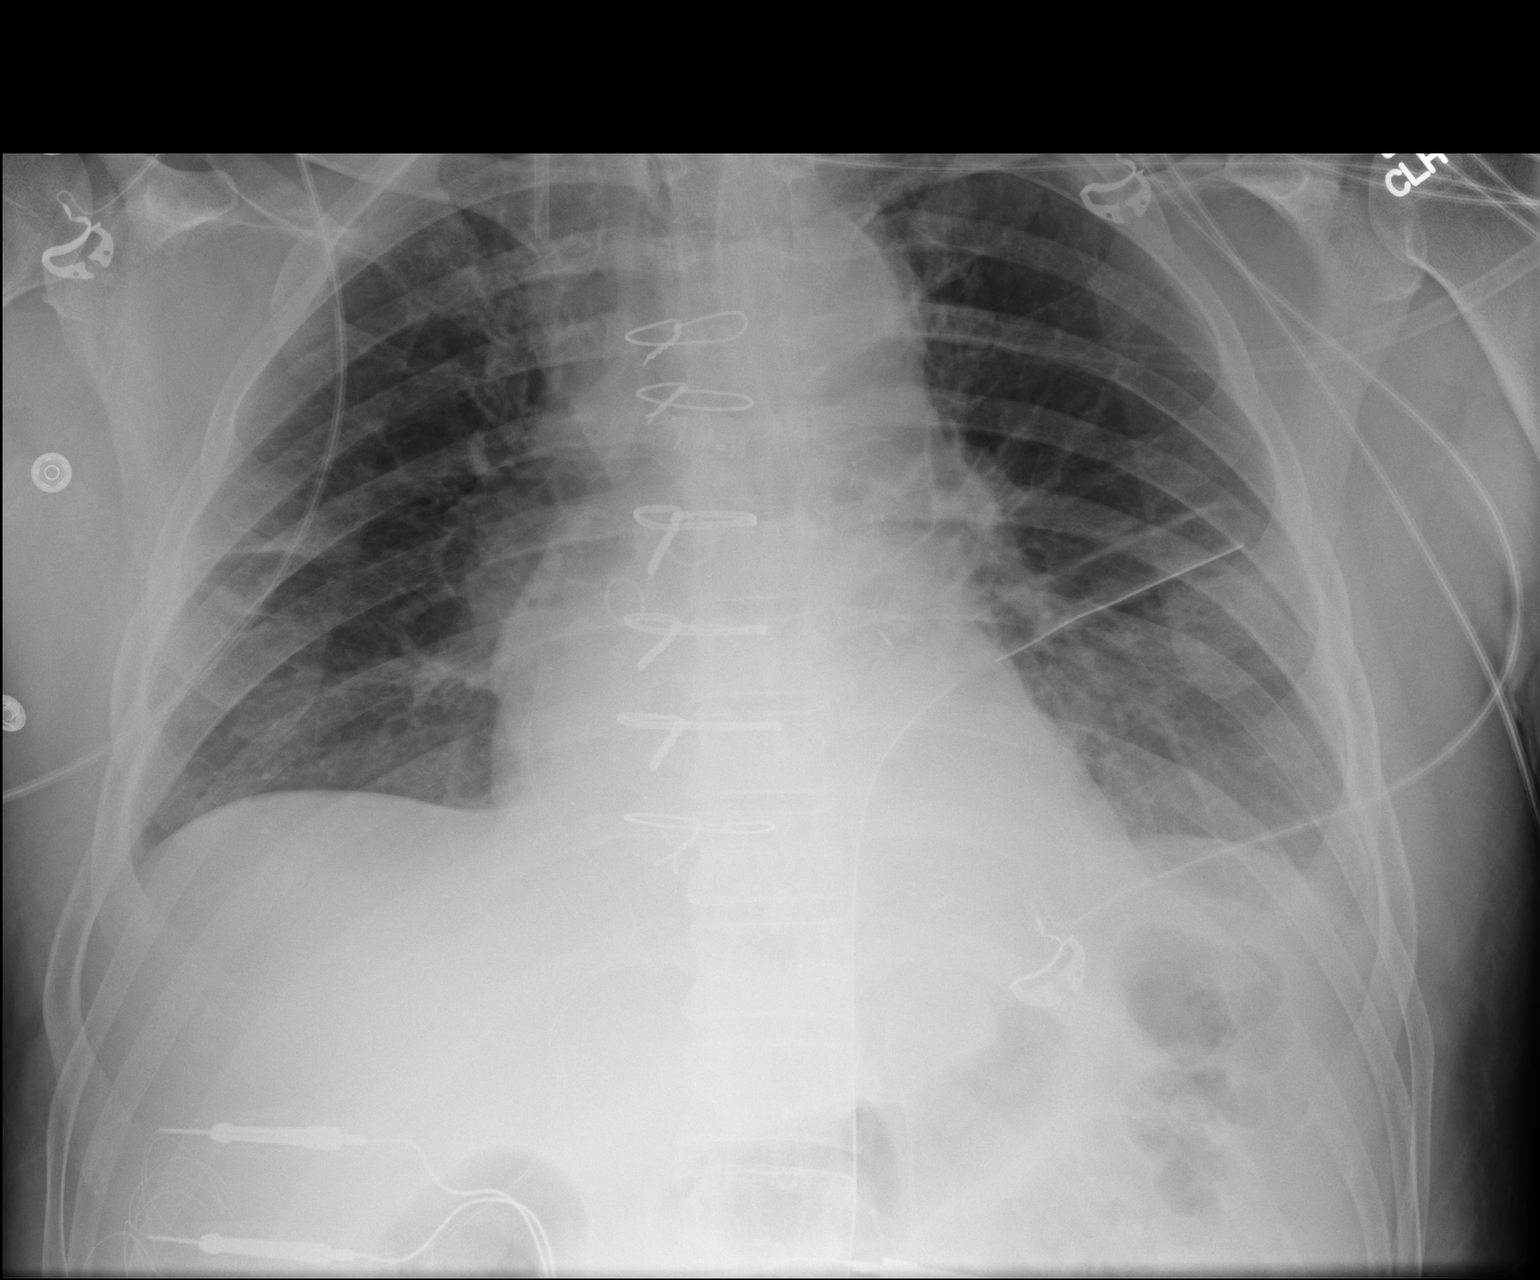

[1 of 1 positions shown; findings below may reference images not displayed]

FINDINGS: Since prior study, the Swan-Ganz catheter and mediastinal tube have
been removed. The right internal jugular introducer sheath and a
left chest tube remaining in place.

Cardiac silhouette is normal in size and configuration. No
mediastinal widening.

Mild lung base atelectasis is stable no pulmonary edema. No
pneumothorax.
IMPRESSION: 1. Status post cardiac surgery. No evidence of an operative
complication.
2. Remaining support apparatus is well positioned.
3. Persistent mild lung base atelectasis, greater the left. No
pulmonary edema. No pneumothorax.

## 2015-08-24 ENCOUNTER — Ambulatory Visit (INDEPENDENT_AMBULATORY_CARE_PROVIDER_SITE_OTHER): Payer: Medicare Other | Admitting: Interventional Cardiology

## 2015-08-24 ENCOUNTER — Encounter: Payer: Self-pay | Admitting: Interventional Cardiology

## 2015-08-24 VITALS — BP 120/70 | HR 64 | Ht 71.0 in | Wt 201.8 lb

## 2015-08-24 DIAGNOSIS — I6521 Occlusion and stenosis of right carotid artery: Secondary | ICD-10-CM | POA: Diagnosis not present

## 2015-08-24 DIAGNOSIS — I214 Non-ST elevation (NSTEMI) myocardial infarction: Secondary | ICD-10-CM | POA: Diagnosis not present

## 2015-08-24 DIAGNOSIS — I2510 Atherosclerotic heart disease of native coronary artery without angina pectoris: Secondary | ICD-10-CM | POA: Diagnosis not present

## 2015-08-24 DIAGNOSIS — I6529 Occlusion and stenosis of unspecified carotid artery: Secondary | ICD-10-CM | POA: Insufficient documentation

## 2015-08-24 DIAGNOSIS — E785 Hyperlipidemia, unspecified: Secondary | ICD-10-CM | POA: Diagnosis not present

## 2015-08-24 MED ORDER — ASPIRIN EC 81 MG PO TBEC: 81.0000 mg | DELAYED_RELEASE_TABLET | Freq: Every day | ORAL | Status: AC

## 2015-08-24 NOTE — Patient Instructions (Signed)
Your physician has recommended you make the following change in your medication:  1.) change aspirin to 81 mg daily  Your physician has requested that you have a carotid duplex. This test is an ultrasound of the carotid arteries in your neck. It looks at blood flow through these arteries that supply the brain with blood. Allow one hour for this exam. There are no restrictions or special instructions. HAVE THIS COMPLETED PRIOR TO YOUR APPOINTMENT WITH DR Hays.  Your physician recommends that you return for lab work in: 6-8 WEEKS (LIPIDS, LIVER)  Your physician wants you to follow-up in: [place].  You will receive a reminder letter in the mail two months in advance. If you don't receive a letter, please call our office to schedule the follow-up appointment.

## 2015-08-24 NOTE — Progress Notes (Signed)
Cardiology Office Note   Date:  08/24/2015   ID:  Adrian [name], DOB 07-15-1946, MRN [id number]  PCP:  [name], MD  Cardiologist:  [name], MD   Chief Complaint  Patient presents with  . Coronary Artery Disease      History of Present Illness: Adrian [name] is a 70 y.o. male who presents for minimal right carotid atherosclerosis, hyperlipidemia, coronary artery disease with prior bypass grafting, and essential hypertension   The patient's major complaints have to do with musculoskeletal discomfort. He feels that some of it is related to statin therapy. The great majority is related to lumbar disc disease.  He denies chest discomfort, syncope, palpitations, and dyspnea. There is been no peripheral edema development.  He was hoping to have lumbar disc surgery but this is been canceled for the time being  Past Medical History  Diagnosis Date  . Diverticulitis   . Hyperlipidemia   . CAD, multiple vessel   . NSTEMI (non-ST elevated myocardial infarction) \    Past Surgical History  Procedure Laterality Date  . Knee surgery    . Coronary artery bypass graft N/A 08/26/2013    Procedure: Coronary Artery Bypass Graft times four using left internal mammary artery and right leg greater saphenous vein harvested endoscopically;  Surgeon: [name], MD;  Location: [place];  Service: Open Heart Surgery;  Laterality: N/A;  . Intraoperative transesophageal echocardiogram N/A 08/26/2013    Procedure: INTRAOPERATIVE TRANSESOPHAGEAL ECHOCARDIOGRAM;  Surgeon: [name], MD;  Location: [place];  Service: Open Heart Surgery;  Laterality: N/A;  . Left heart catheterization with coronary angiogram N/A 08/24/2013    Procedure: LEFT HEART CATHETERIZATION WITH CORONARY ANGIOGRAM;  Surgeon: [name], MD;  Location: [facility] C[facility] LAB;  Service: Cardiovascular;  Laterality: N/A;  . Transurethral resection of prostate       Current Outpatient Prescriptions    Medication Sig Dispense Refill  . aspirin EC 325 MG EC tablet Take 1 tablet (325 mg total) by mouth daily. 30 tablet 0  . gabapentin (NEURONTIN) 300 MG capsule Take 300 mg by mouth 3 (three) times daily.    [name] HYDROmorphone (DILAUDID) 2 MG tablet Take 2 mg by mouth 5 (five) times daily as needed for moderate pain.     . simvastatin (ZOCOR) 20 MG tablet Take 1 tablet (20 mg total) by mouth daily. 30 tablet 6  . zolpidem (AMBIEN) 5 MG tablet Take 2.5 mg by mouth at bedtime as needed for sleep.     No current facility-administered medications for this visit.    Allergies:   Codeine and Hydrocodone    Social History:  The patient  reports that he quit smoking about 2 years ago. He does not have any smokeless tobacco history on file. He reports that he drinks about 3.5 oz of alcohol per week. He reports that he does not use illicit drugs.   Family History:  The patient's family history includes Heart attack in his father; Lung disease in his mother.    ROS:  Please see the history of present illness.   Otherwise, review of systems are positive for balance difficulty, constipation, back pain, muscle pain.   All other systems are reviewed and negative.    PHYSICAL EXAM: VS:  There were no vitals taken for this visit. , BMI There is no weight on file to calculate BMI. GEN: Well nourished, well developed, in no acute distress HEENT: normal Neck: no JVD, carotid  bruits, or masses Cardiac: RRR.  There is no murmur, rub, or gallop. There is no edema. Respiratory:  clear to auscultation bilaterally, normal work of breathing. GI: soft, nontender, nondistended, + BS MS: no deformity or atrophy Skin: warm and dry, no rash Neuro:  Strength and sensation are intact Psych: euthymic mood, full affect   EKG:  EKG is not ordered today.    Recent Labs: 08/24/2014: ALT 29    Lipid Panel    Component Value Date/Time   CHOL 134 08/24/2014 0914   TRIG 299.0* 08/24/2014 0914   HDL 29.50*  08/24/2014 0914   CHOLHDL 5 08/24/2014 0914   VLDL 59.8* 08/24/2014 0914   LDLCALC 69 08/24/2013 0447   LDLDIRECT 74.0 08/24/2014 0914      Wt Readings from Last 3 Encounters:  08/24/14 193 lb (87.544 kg)  04/18/14 197 lb (89.359 kg)  02/13/14 195 lb (88.451 kg)      Other studies Reviewed: Additional studies/ records that were reviewed today include: Electronic health record. No new data identified.. The findings include.    ASSESSMENT AND PLAN:  1. CAD, multiple vessel Asymptomatic post bypass surgery 2 years ago  2. Hyperlipidemia Hypertriglyceridemia almost recent lipid panel, 2016. Not currently on simvastatin because of muscle aches  3. Carotid artery disease Asymptomatic  4. NSTEMI (non-ST elevated myocardial infarction) ([place]) No recurrence  5. Essential hypertension Well controlled Well controlled Current medicines are reviewed at length with the patient today.  The patient has the following concerns regarding medicines: .  The following changes/actions have been instituted:    Fasting liver and lipid panel  Bilateral carotid Doppler done prior to next office visit in one year  Decrease aspirin 81 mg per day  Aerobic activity and weight control  Labs/ tests ordered today include:  No orders of the defined types were placed in this encounter.     Disposition:   FU with HS in 1 year  Signed, [name], MD  08/24/2015 8:12 AM    [facility] [place], [place], [place]  [address] Phone: [phone]; Fax: [phone]

## 2015-11-09 ENCOUNTER — Other Ambulatory Visit: Payer: Self-pay | Admitting: Interventional Cardiology

## 2015-12-08 ENCOUNTER — Emergency Department (HOSPITAL_COMMUNITY)
Admission: EM | Admit: 2015-12-08 | Discharge: 2015-12-08 | Disposition: A | Discharge status: Home / Self Care | Payer: Non-veteran care | Attending: Emergency Medicine | Admitting: Emergency Medicine

## 2015-12-08 ENCOUNTER — Emergency Department (HOSPITAL_COMMUNITY): Payer: Non-veteran care

## 2015-12-08 ENCOUNTER — Encounter (HOSPITAL_COMMUNITY): Payer: Self-pay | Admitting: Emergency Medicine

## 2015-12-08 DIAGNOSIS — Z87891 Personal history of nicotine dependence: Secondary | ICD-10-CM | POA: Diagnosis not present

## 2015-12-08 DIAGNOSIS — E785 Hyperlipidemia, unspecified: Secondary | ICD-10-CM | POA: Insufficient documentation

## 2015-12-08 DIAGNOSIS — Z79899 Other long term (current) drug therapy: Secondary | ICD-10-CM | POA: Insufficient documentation

## 2015-12-08 DIAGNOSIS — R002 Palpitations: Secondary | ICD-10-CM | POA: Diagnosis present

## 2015-12-08 DIAGNOSIS — Z951 Presence of aortocoronary bypass graft: Secondary | ICD-10-CM | POA: Insufficient documentation

## 2015-12-08 DIAGNOSIS — Z9889 Other specified postprocedural states: Secondary | ICD-10-CM | POA: Insufficient documentation

## 2015-12-08 DIAGNOSIS — R42 Dizziness and giddiness: Secondary | ICD-10-CM | POA: Insufficient documentation

## 2015-12-08 DIAGNOSIS — M109 Gout, unspecified: Secondary | ICD-10-CM | POA: Diagnosis not present

## 2015-12-08 DIAGNOSIS — I2510 Atherosclerotic heart disease of native coronary artery without angina pectoris: Secondary | ICD-10-CM | POA: Insufficient documentation

## 2015-12-08 DIAGNOSIS — R61 Generalized hyperhidrosis: Secondary | ICD-10-CM | POA: Diagnosis not present

## 2015-12-08 DIAGNOSIS — I493 Ventricular premature depolarization: Secondary | ICD-10-CM | POA: Insufficient documentation

## 2015-12-08 DIAGNOSIS — I252 Old myocardial infarction: Secondary | ICD-10-CM | POA: Insufficient documentation

## 2015-12-08 DIAGNOSIS — Z7982 Long term (current) use of aspirin: Secondary | ICD-10-CM | POA: Diagnosis not present

## 2015-12-08 DIAGNOSIS — Z8719 Personal history of other diseases of the digestive system: Secondary | ICD-10-CM | POA: Diagnosis not present

## 2015-12-08 DIAGNOSIS — R110 Nausea: Secondary | ICD-10-CM | POA: Diagnosis not present

## 2015-12-08 HISTORY — DX: Gout, unspecified: M10.9

## 2015-12-08 LAB — BASIC METABOLIC PANEL
Anion gap: 12 (ref 5–15)
BUN: 22 mg/dL — ABNORMAL HIGH (ref 6–20)
CALCIUM: 9.3 mg/dL (ref 8.9–10.3)
CO2: 24 mmol/L (ref 22–32)
CREATININE: 1.43 mg/dL — AB (ref 0.61–1.24)
Chloride: 105 mmol/L (ref 101–111)
GFR, EST AFRICAN AMERICAN: 57 mL/min — AB (ref >60)
GFR, EST NON AFRICAN AMERICAN: 49 mL/min — AB (ref >60)
GLUCOSE: 88 mg/dL (ref 65–99)
Potassium: 4 mmol/L (ref 3.5–5.1)
Sodium: 141 mmol/L (ref 135–145)

## 2015-12-08 LAB — CBC
HCT: 47.6 % (ref 39.0–52.0)
Hemoglobin: 16.4 g/dL (ref 13.0–17.0)
MCH: 29.8 pg (ref 26.0–34.0)
MCHC: 34.5 g/dL (ref 30.0–36.0)
MCV: 86.5 fL (ref 78.0–100.0)
Platelets: 202 10*3/uL (ref 150–400)
RBC: 5.5 MIL/uL (ref 4.22–5.81)
RDW: 12.7 % (ref 11.5–15.5)
WBC: 13.1 10*3/uL — ABNORMAL HIGH (ref 4.0–10.5)

## 2015-12-08 LAB — I-STAT TROPONIN, ED: TROPONIN I, POC: 0 ng/mL (ref 0.00–0.08)

## 2015-12-08 LAB — PHOSPHORUS: Phosphorus: 4.2 mg/dL (ref 2.5–4.6)

## 2015-12-08 LAB — MAGNESIUM: Magnesium: 2.4 mg/dL (ref 1.7–2.4)

## 2015-12-08 MED ORDER — MAGNESIUM OXIDE 400 MG PO CAPS: 400.0000 mg | ORAL_CAPSULE | Freq: Two times a day (BID) | ORAL | Status: DC

## 2015-12-08 MED ORDER — COLCHICINE 0.6 MG PO TABS
1.2000 mg | ORAL_TABLET | Freq: Once | ORAL | Status: AC
  Administered 2015-12-08: 1.2 mg via ORAL
  Filled 2015-12-08: qty 2

## 2015-12-08 MED ORDER — COLCHICINE 0.6 MG PO TABS
0.6000 mg | ORAL_TABLET | Freq: Once | ORAL | Status: AC
  Administered 2015-12-08: 0.6 mg via ORAL
  Filled 2015-12-08: qty 1

## 2015-12-08 MED ORDER — MAGNESIUM OXIDE 400 (241.3 MG) MG PO TABS: 800.0000 mg | ORAL_TABLET | Freq: Once | ORAL | Status: DC

## 2015-12-08 NOTE — ED Provider Notes (Signed)
CSN: [id number]     Arrival date & time 12/08/15  1422 History   First MD Initiated Contact with Patient 12/08/15 1502     Chief Complaint  Patient presents with  . Irregular Heart Beat     (Consider location/radiation/quality/duration/timing/severity/associated sxs/prior Treatment) Patient is a 70 y.o. male presenting with palpitations. The history is provided by the patient and the spouse.  Palpitations Palpitations quality:  Irregular Onset quality:  Sudden Duration:  2 days Timing:  Constant Progression:  Worsening Chronicity:  New Relieved by:  Nothing Worsened by:  Nothing Ineffective treatments:  None tried Associated symptoms: chest pain, chest pressure, diaphoresis, dizziness and nausea   Associated symptoms: no shortness of breath and no vomiting    70 yo M with a cc of palpitations. This has been going on for the past couple days.  Hx of similar found to be PVC's in the past.  Having them every couple minutes. Denies any cough congestion fevers or chills. Denies any lower extremity edema. Nonexertional symptoms. Does not feel like his prior MI. History of CABG as well as multiple stents. Sees Dr. [name] in the office. He is recently had a spinal injection as well as was started on prednisone for gout. Denies any other medication changes.  Past Medical History  Diagnosis Date  . Diverticulitis   . Hyperlipidemia   . CAD, multiple vessel   . NSTEMI (non-ST elevated myocardial infarction) ([place]) \  . Gout    Past Surgical History  Procedure Laterality Date  . Knee surgery    . Coronary artery bypass graft N/A 08/26/2013    Procedure: Coronary Artery Bypass Graft times four using left internal mammary artery and right leg greater saphenous vein harvested endoscopically;  Surgeon: [name], MD;  Location: [place];  Service: Open Heart Surgery;  Laterality: N/A;  . Intraoperative transesophageal echocardiogram N/A 08/26/2013    Procedure: INTRAOPERATIVE  TRANSESOPHAGEAL ECHOCARDIOGRAM;  Surgeon: [name], MD;  Location: [place];  Service: Open Heart Surgery;  Laterality: N/A;  . Left heart catheterization with coronary angiogram N/A 08/24/2013    Procedure: LEFT HEART CATHETERIZATION WITH CORONARY ANGIOGRAM;  Surgeon: [name], MD;  Location: [facility] CATH LAB;  Service: Cardiovascular;  Laterality: N/A;  . Transurethral resection of prostate     Family History  Problem Relation Age of Onset  . Heart attack Father   . Lung disease Mother    Social History  Substance Use Topics  . Smoking status: Former Smoker    Quit date: 08/23/2013  . Smokeless tobacco: None  . Alcohol Use: 3.5 oz/week    7 Standard drinks or equivalent per week     Comment: occ    Review of Systems  Constitutional: Positive for diaphoresis. Negative for fever and chills.  HENT: Negative for congestion and facial swelling.   Eyes: Negative for discharge and visual disturbance.  Respiratory: Positive for chest tightness. Negative for shortness of breath.   Cardiovascular: Positive for chest pain and palpitations.  Gastrointestinal: Positive for nausea. Negative for vomiting, abdominal pain and diarrhea.  Musculoskeletal: Negative for myalgias and arthralgias.  Skin: Negative for color change and rash.  Neurological: Positive for dizziness. Negative for tremors, syncope and headaches.  Psychiatric/Behavioral: Negative for confusion and dysphoric mood.      Allergies  Ibuprofen; Codeine; and Hydrocodone  Home Medications   Prior to Admission medications   Medication Sig Start Date End Date Taking? Authorizing Provider  amoxicillin (AMOXIL) 500 MG capsule Take  4 capsules by mouth as needed. (dental procedures) 05/17/15  Yes Historical Provider, MD  aspirin EC 81 MG tablet Take 1 tablet (81 mg total) by mouth daily. 08/24/15  Yes Belva Crome, MD  docusate sodium (COLACE) 100 MG capsule Take 100 mg by mouth daily as needed for mild constipation.    Yes Historical Provider, MD  gabapentin (NEURONTIN) 300 MG capsule Take 300 mg by mouth 3 (three) times daily.   Yes Historical Provider, MD  HYDROmorphone (DILAUDID) 2 MG tablet Take 2 mg by mouth 5 (five) times daily as needed for moderate pain.  02/06/14  Yes Historical Provider, MD  predniSONE (DELTASONE) 20 MG tablet Take 20-60 mg by mouth daily as needed (for gout flareup).  12/01/15 12/11/15 Yes Historical Provider, MD  simvastatin (ZOCOR) 20 MG tablet TAKE 1 TABLET BY MOUTH DAILY 11/09/15  Yes Belva Crome, MD  zolpidem (AMBIEN) 5 MG tablet Take 2.5-5 mg by mouth at bedtime.    Yes Historical Provider, MD  Magnesium Oxide 400 MG CAPS Take 1 capsule (400 mg total) by mouth 2 (two) times daily. 12/08/15   Deno Etienne, DO   BP 143/87 mmHg  Pulse 50  Temp(Src) 97.7 F (36.5 C) (Oral)  Resp 17  Ht 5\' 11"  (1.803 m)  Wt 196 lb (88.905 kg)  BMI 27.35 kg/m2  SpO2 97% Physical Exam  Constitutional: He is oriented to person, place, and time. He appears well-developed and well-nourished.  HENT:  Head: Normocephalic and atraumatic.  Eyes: EOM are normal. Pupils are equal, round, and reactive to light.  Neck: Normal range of motion. Neck supple. No JVD present.  Cardiovascular: Regular rhythm and intact distal pulses.  Bradycardia present.  Exam reveals no gallop and no friction rub.   No murmur heard. Pulmonary/Chest: No respiratory distress. He has no wheezes. He has no rales.  sternotomy scar, well healed  Abdominal: He exhibits no distension. There is no tenderness. There is no rebound and no guarding.  Musculoskeletal: Normal range of motion. He exhibits tenderness (right MTP with swelling and tenderness.).  Neurological: He is alert and oriented to person, place, and time.  Skin: No rash noted. No pallor.  Psychiatric: He has a normal mood and affect. His behavior is normal.  Nursing note and vitals reviewed.   ED Course  Procedures (including critical care time) Labs Review Labs  Reviewed  BASIC METABOLIC PANEL - Abnormal; Notable for the following:    BUN 22 (*)    Creatinine, Ser 1.43 (*)    GFR calc non Af Amer 49 (*)    GFR calc Af Amer 57 (*)    All other components within normal limits  CBC - Abnormal; Notable for the following:    WBC 13.1 (*)    All other components within normal limits  MAGNESIUM  PHOSPHORUS  I-STAT TROPOININ, ED    Imaging Review Dg Chest 2 View  12/08/2015  CLINICAL DATA:  Tachycardia with nausea and diaphoresis. EXAM: CHEST  2 VIEW COMPARISON:  09/16/2013 FINDINGS: Sternotomy wires unchanged. Lungs are adequately inflated without consolidation or effusion. Cardiomediastinal silhouette and remainder of the exam is unchanged. IMPRESSION: No active cardiopulmonary disease. Electronically Signed   By: Marin Olp M.D.   On: 12/08/2015 15:28   I have personally reviewed and evaluated these images and lab results as part of my medical decision-making.   EKG Interpretation   Date/Time:  Saturday Dec 08 2015 14:31:49 EDT Ventricular Rate:  68 PR Interval:  154 QRS  Duration: 78 QT Interval:  392 QTC Calculation: 416 R Axis:   18 Text Interpretation:  Sinus rhythm with frequent Premature ventricular  complexes Otherwise normal ECG Otherwise no significant change Confirmed  by Mavric Cortright MD, Quillian Quince [id number]) on 12/08/2015 3:03:52 PM      MDM   Final diagnoses:  PVC's (premature ventricular contractions)    70 yo M With a chief complaints of palpitations. Going on for the past couple days. Has a history of PVCs. Has PVCs on the monitor coinciding with his tenderness. Will obtain electrolytes keep him on a cardiac monitor. Will discuss with cardiology possible treatment. Patient is bradycardic in the low to upper 50s. Cards recommends magnesium supplementation.    6:57 PM:  I have discussed the diagnosis/risks/treatment options with the patient and family and believe the pt to be eligible for discharge home to follow-up with cards. We  also discussed returning to the ED immediately if new or worsening sx occur. We discussed the sx which are most concerning (e.g., sudden worsening pain, fever, inability to tolerate by mouth) that necessitate immediate return. Medications administered to the patient during their visit and any new prescriptions provided to the patient are listed below.  Medications given during this visit Medications  magnesium oxide (MAG-OX) tablet 800 mg (not administered)  colchicine tablet 1.2 mg (1.2 mg Oral Given 12/08/15 1532)  colchicine tablet 0.6 mg (0.6 mg Oral Given 12/08/15 1642)    Discharge Medication List as of 12/08/2015  4:29 PM    START taking these medications   Details  Magnesium Oxide 400 MG CAPS Take 1 capsule (400 mg total) by mouth 2 (two) times daily., Starting 12/08/2015, Until Discontinued, Print        The patient appears reasonably screen and/or stabilized for discharge and I doubt any other medical condition or other [facility] requiring further screening, evaluation, or treatment in the ED at this time prior to discharge.    Deno Etienne, DO 12/08/15 1857

## 2015-12-08 NOTE — Discharge Instructions (Signed)
Follow up with Dr. [name] this week.  Return for worsening symptoms.  Premature Ventricular Contraction A premature ventricular contraction is an irregularity in the normal heart rhythm. These contractions are extra heartbeats that occur too early in the normal sequence. In most cases, these contractions are harmless and do not require treatment. CAUSES Premature ventricular contractions may occur without a known cause. In healthy people, the extra contractions may be caused by:  Smoking.  Drinking alcohol.  Caffeine.  Certain medicines.  Some illegal drugs.  Stress. Sometimes, changes in chemicals in the blood (electrolytes) can also cause premature ventricular contractions. They can also occur in people with heart diseases that cause a decrease in blood flow to the heart. SIGNS AND SYMPTOMS Premature ventricular contractions often do not cause any symptoms. In some cases, you may have a feeling of your heart beating fast or skipping a beat (palpitations). DIAGNOSIS Your health care provider will take your medical history and do a physical exam. During the exam, the health care provider will check for irregular heartbeats. Various tests may be done to help diagnose premature ventricular contractions. These tests may include:  An ECG (electrocardiogram) to monitor the electrical activity of your heart.  Holter monitor testing. A Holter monitor is a portable device that can monitor the electrical activity of your heart over longer periods of time.  Stress tests to see how exercise affects your heart rhythm.  Echocardiogram. This test uses sound waves (ultrasound) to produce an image of your heart.  Electrophysiology study. This is used to evaluate the electrical conduction system of your heart. TREATMENT Usually, no treatment is needed. You may be advised to avoid things that can trigger the premature contractions, such as caffeine or alcohol. Medicines are sometimes given if symptoms  are severe or if the extra heartbeats are very frequent. Treatment may also be needed for an underlying cause of the contractions if one is found. HOME CARE INSTRUCTIONS  Take medicines only as directed by your health care provider.  Make any lifestyle changes recommended by your health care provider. These may include:  Quitting smoking.  Avoiding or limiting caffeine or alcohol.  Exercising. Talk to your health care provider about what type of exercise is safe for you.  Trying to reduce stress.  Keep all follow-up visits with your health care provider. This is important. SEEK IMMEDIATE MEDICAL CARE IF:  You feel palpitations that are frequent or continual.  You have chest pain.  You have shortness of breath.  You have sweating for no reason.  You have nausea and vomiting.  You become light-headed or faint.   This information is not intended to replace advice given to you by your health care provider. Make sure you discuss any questions you have with your health care provider.   Document Released: 02/29/2004 Document Revised: 08/04/2014 Document Reviewed: 12/15/2013 Elsevier Interactive Patient Education [employer].

## 2015-12-08 NOTE — ED Notes (Signed)
Pt here for frequent PVCs starting 3 days ago after taking prednisone. Pt reports "runs " of PVCs at home lasting several seconds with accompanying nausea and dizziness. Pt denies SOB, CP. Pt diaphoretic at this time.

## 2015-12-21 ENCOUNTER — Ambulatory Visit (INDEPENDENT_AMBULATORY_CARE_PROVIDER_SITE_OTHER): Payer: Medicare Other | Admitting: Physician Assistant

## 2015-12-21 ENCOUNTER — Telehealth: Payer: Self-pay | Admitting: *Deleted

## 2015-12-21 ENCOUNTER — Encounter: Payer: Self-pay | Admitting: Physician Assistant

## 2015-12-21 VITALS — BP 120/62 | HR 67 | Ht 71.0 in | Wt 201.8 lb

## 2015-12-21 DIAGNOSIS — I2510 Atherosclerotic heart disease of native coronary artery without angina pectoris: Secondary | ICD-10-CM

## 2015-12-21 DIAGNOSIS — E785 Hyperlipidemia, unspecified: Secondary | ICD-10-CM

## 2015-12-21 DIAGNOSIS — I493 Ventricular premature depolarization: Secondary | ICD-10-CM

## 2015-12-21 LAB — BASIC METABOLIC PANEL
BUN: 10 mg/dL (ref 7–25)
CALCIUM: 9 mg/dL (ref 8.6–10.3)
CO2: 25 mmol/L (ref 20–31)
Chloride: 106 mmol/L (ref 98–110)
Creat: 1.3 mg/dL — ABNORMAL HIGH (ref 0.70–1.25)
Glucose, Bld: 93 mg/dL (ref 65–99)
Potassium: 4.4 mmol/L (ref 3.5–5.3)
SODIUM: 138 mmol/L (ref 135–146)

## 2015-12-21 NOTE — Telephone Encounter (Signed)
Pt notified of lab results by phone with verbal understanding.  

## 2015-12-21 NOTE — Progress Notes (Signed)
Cardiology Office Note:    Date:  12/21/2015   ID:  Adrian [name], DOB Apr 24, 1946, MRN [id number]  PCP:  [name], MD  Cardiologist:  Dr. [name]   Electrophysiologist:  n/a  Referring MD: [name], MD   Chief Complaint  Patient presents with  . Hospitalization Follow-up    ED visit for PVCs    History of Present Illness:     Adrian [name] is a 70 y.o. male with a hx of CAD s/p NSTEMI 07/2013 followed by subsequent CABG.  He is intol of statins due to myalgias. Last seen by Dr. [name] 1/17. Recently seen in the emergency room 12/08/15 for PVCs.   He tells me that he was on prednisone taper for gout and then had an ESI for his back.  Several days later is when he started having PVCs.  He was very symptomatic with this.  He was put on Mg2+ in the ED. He stopped the prednisone.  He has not had any further PVCs since.  He is overall feeling well.  The patient denies chest pain, shortness of breath, syncope, orthopnea, PND or significant pedal edema.    Past Medical History  Diagnosis Date  . Diverticulitis   . Hyperlipidemia   . CAD, multiple vessel   . NSTEMI (non-ST elevated myocardial infarction) ([place]) \  . Gout     Past Surgical History  Procedure Laterality Date  . Knee surgery    . Coronary artery bypass graft N/A 08/26/2013    Procedure: Coronary Artery Bypass Graft times four using left internal mammary artery and right leg greater saphenous vein harvested endoscopically;  Surgeon: [name], MD;  Location: [place];  Service: Open Heart Surgery;  Laterality: N/A;  . Intraoperative transesophageal echocardiogram N/A 08/26/2013    Procedure: INTRAOPERATIVE TRANSESOPHAGEAL ECHOCARDIOGRAM;  Surgeon: [name], MD;  Location: [place];  Service: Open Heart Surgery;  Laterality: N/A;  . Left heart catheterization with coronary angiogram N/A 08/24/2013    Procedure: LEFT HEART CATHETERIZATION WITH CORONARY ANGIOGRAM;  Surgeon: [name], MD;  Location: [facility] CATH LAB;  Service: Cardiovascular;  Laterality: N/A;  . Transurethral resection of prostate      Current Medications: Outpatient Prescriptions Prior to Visit  Medication Sig Dispense Refill  . amoxicillin (AMOXIL) 500 MG capsule Take 4 capsules by mouth as needed. (dental procedures)  1  . aspirin EC 81 MG tablet Take 1 tablet (81 mg total) by mouth daily. 90 tablet 3  . docusate sodium (COLACE) 100 MG capsule Take 100 mg by mouth daily as needed for mild constipation.    . gabapentin (NEURONTIN) 300 MG capsule Take 300 mg by mouth 3 (three) times daily.    [name] HYDROmorphone (DILAUDID) 2 MG tablet Take 2 mg by mouth 5 (five) times daily as needed for moderate pain.     . simvastatin (ZOCOR) 20 MG tablet TAKE 1 TABLET BY MOUTH DAILY 30 tablet 8  . Magnesium Oxide 400 MG CAPS Take 1 capsule (400 mg total) by mouth 2 (two) times daily. 30 capsule 0  . zolpidem (AMBIEN) 5 MG tablet Take 2.5-5 mg by mouth at bedtime. Reported on 12/21/2015     No facility-administered medications prior to visit.      Allergies:   Ibuprofen; Codeine; and Hydrocodone   Social History   Social History  . Marital Status: Married    Spouse Name: N/A  . Number of Children: N/A  . Years  of Education: N/A   Social History Main Topics  . Smoking status: Former Smoker    Quit date: 08/23/2013  . Smokeless tobacco: None  . Alcohol Use: 3.5 oz/week    7 Standard drinks or equivalent per week     Comment: occ  . Drug Use: No  . Sexual Activity: Not Asked   Other Topics Concern  . None   Social History Narrative     Family History:  The patient's family history includes Heart attack in his father; Lung disease in his mother.   ROS:   Please see the history of present illness.    Review of Systems  Cardiovascular: Positive for irregular heartbeat.  Musculoskeletal: Positive for back pain.  Neurological: Positive for loss of balance.   All other systems reviewed and are  negative.   Physical Exam:    VS:  BP 120/62 mmHg  Pulse 67  Ht 5\' 11"  (1.803 m)  Wt 201 lb 12.8 oz (91.536 kg)  BMI 28.16 kg/m2   GEN: Well nourished, well developed, in no acute distress HEENT: normal Neck: no JVD, no masses Cardiac: Normal S1/S2, RRR; no murmurs, rubs, or gallops, no edema;     Respiratory:  clear to auscultation bilaterally; no wheezing, rhonchi or rales GI: soft, nontender, nondistended MS: no deformity or atrophy Skin: warm and dry Neuro: No focal deficits  Psych: Alert and oriented x 3, normal affect  Wt Readings from Last 3 Encounters:  12/21/15 201 lb 12.8 oz (91.536 kg)  12/08/15 196 lb (88.905 kg)  08/24/15 201 lb 12.8 oz (91.536 kg)      Studies/Labs Reviewed:     EKG:  EKG is  ordered today.  The ekg ordered today demonstrates NSR, HR 67, normal axis, QTc 414 ms, no changes  Recent Labs: 12/08/2015: BUN 22*; Creatinine, Ser 1.43*; Hemoglobin 16.4; Magnesium 2.4; Platelets 202; Potassium 4.0; Sodium 141   Recent Lipid Panel    Component Value Date/Time   CHOL 134 08/24/2014 0914   TRIG 299.0* 08/24/2014 0914   HDL 29.50* 08/24/2014 0914   CHOLHDL 5 08/24/2014 0914   VLDL 59.8* 08/24/2014 0914   LDLCALC 69 08/24/2013 0447   LDLDIRECT 74.0 08/24/2014 0914    Additional studies/ records that were reviewed today include:   - LHC (07/2013): NSTEMI >> prox to mid LAD 95%, mid LAD 40%, OM2 65-75%, mid RCA 99%, EF 60% >> CABG (L-LAD, S-OM2, S-AM/dist RCA) - Dr. [name] - Echo (8/14): EF 55-60%, no RWMA - Carotid US (1/15): Bilateral ICA 1-39%  ASSESSMENT:     1. PVC's (premature ventricular contractions)   2. Coronary artery disease involving native coronary artery of native heart without angina pectoris   3. Hyperlipidemia     PLAN:     In order of problems listed above:  1. PVCs - Resolved.  Suspect these were all related to prednisone Rx and ESI.  He has had no recurrence.  His Mg2+ level was ok.  Will DC this.  His  Creatinine was elevated in the ED.  Repeat BMET today.  If PVCs recur, will consider Echo and 24 Hr Holter.    2. CAD - No angina.  Continue ASA, statin.   3. HL - Continue statin.    Medication Adjustments/Labs and Tests Ordered: Current medicines are reviewed at length with the patient today.  Concerns regarding medicines are outlined above.  Medication changes, Labs and Tests ordered today are outlined in the Patient Instructions noted below. Patient Instructions  Medication Instructions:  1. STOP MAGNESIUM Labwork: TODAY BMET  Testing/Procedures: NONE Follow-Up: Your physician wants you to follow-up in: 07/2016 WITH DR. Gaspar Bidding will receive a reminder letter in the mail two months in advance. If you don't receive a letter, please call our office to schedule the follow-up appointment. Any Other Special Instructions Will Be Listed Below (If Applicable). If you need a refill on your cardiac medications before your next appointment, please call your pharmacy.    Signed, Richardson Dopp, PA-C  12/21/2015 11:48 AM    [facility] [place], [place], [place]  [address] Phone: [phone]; Fax: [phone]

## 2015-12-21 NOTE — Patient Instructions (Addendum)
Medication Instructions:  1. STOP MAGNESIUM Labwork: TODAY BMET  Testing/Procedures: NONE Follow-Up: Your physician wants you to follow-up in: 07/2016 WITH DR. Gaspar Bidding will receive a reminder letter in the mail two months in advance. If you don't receive a letter, please call our office to schedule the follow-up appointment. Any Other Special Instructions Will Be Listed Below (If Applicable). If you need a refill on your cardiac medications before your next appointment, please call your pharmacy.

## 2016-08-14 ENCOUNTER — Other Ambulatory Visit: Payer: Self-pay | Admitting: Interventional Cardiology

## 2016-08-14 ENCOUNTER — Inpatient Hospital Stay (HOSPITAL_COMMUNITY): Admission: RE | Admit: 2016-08-14 | Payer: Self-pay | Source: Ambulatory Visit

## 2016-08-22 ENCOUNTER — Encounter (HOSPITAL_COMMUNITY): Payer: Self-pay

## 2016-08-25 ENCOUNTER — Ambulatory Visit (HOSPITAL_COMMUNITY)
Admission: RE | Admit: 2016-08-25 | Discharge: 2016-08-25 | Disposition: A | Discharge status: Home / Self Care | Payer: Medicare Other | Source: Ambulatory Visit | Attending: Interventional Cardiology | Admitting: Interventional Cardiology

## 2016-08-25 DIAGNOSIS — E785 Hyperlipidemia, unspecified: Secondary | ICD-10-CM

## 2016-08-25 DIAGNOSIS — I214 Non-ST elevation (NSTEMI) myocardial infarction: Secondary | ICD-10-CM

## 2016-08-25 DIAGNOSIS — I2510 Atherosclerotic heart disease of native coronary artery without angina pectoris: Secondary | ICD-10-CM

## 2016-08-25 DIAGNOSIS — I6523 Occlusion and stenosis of bilateral carotid arteries: Secondary | ICD-10-CM | POA: Diagnosis not present

## 2016-08-26 ENCOUNTER — Ambulatory Visit (INDEPENDENT_AMBULATORY_CARE_PROVIDER_SITE_OTHER): Payer: Medicare Other | Admitting: Interventional Cardiology

## 2016-08-26 ENCOUNTER — Encounter: Payer: Self-pay | Admitting: Interventional Cardiology

## 2016-08-26 VITALS — BP 102/70 | HR 67 | Ht 71.0 in | Wt 200.4 lb

## 2016-08-26 DIAGNOSIS — I6529 Occlusion and stenosis of unspecified carotid artery: Secondary | ICD-10-CM

## 2016-08-26 DIAGNOSIS — E784 Other hyperlipidemia: Secondary | ICD-10-CM | POA: Diagnosis not present

## 2016-08-26 DIAGNOSIS — I493 Ventricular premature depolarization: Secondary | ICD-10-CM

## 2016-08-26 DIAGNOSIS — I2510 Atherosclerotic heart disease of native coronary artery without angina pectoris: Secondary | ICD-10-CM

## 2016-08-26 DIAGNOSIS — E7849 Other hyperlipidemia: Secondary | ICD-10-CM

## 2016-08-26 NOTE — Patient Instructions (Signed)
Medication Instructions:  None  Labwork: None  Testing/Procedures: Plan to repeat your Carotid doppler in one year.  Follow-Up: Your physician wants you to follow-up in: 1 year with Dr. [name].  You will receive a reminder letter in the mail two months in advance. If you don't receive a letter, please call our office to schedule the follow-up appointment.   Any Other Special Instructions Will Be Listed Below (If Applicable).     If you need a refill on your cardiac medications before your next appointment, please call your pharmacy.

## 2016-08-26 NOTE — Progress Notes (Signed)
Leg pain   Cardiology Office Note    Date:  08/26/2016   ID:  ANTONE VILLARUZ, DOB 09-May-1946, MRN [id number]  PCP:  [name], MD  Cardiologist: [name], MD   Chief Complaint  Patient presents with  . Coronary Artery Disease    History of Present Illness:  Adrian [name] is a 71 y.o. male with a hx of CAD s/p NSTEMI 07/2013 followed by subsequent CABG.  He is intol of statins due to myalgias. Since last visit was seen in ER for PVCs.   He had palpitations in May. Emergency department visit occurred. He was having PVCs. No specific explanation other than to temporal steroid injections for his back and for gout. It was felt that this could've been contributing to the PVCs. He denies angina. He has not had edema. Overall he feels well. No recurrence of palpitations.  Past Medical History:  Diagnosis Date  . CAD, multiple vessel   . Diverticulitis   . Gout   . Hyperlipidemia   . NSTEMI (non-ST elevated myocardial infarction) ([place]) \    Past Surgical History:  Procedure Laterality Date  . CORONARY ARTERY BYPASS GRAFT N/A 08/26/2013   Procedure: Coronary Artery Bypass Graft times four using left internal mammary artery and right leg greater saphenous vein harvested endoscopically;  Surgeon: [name], MD;  Location: [place];  Service: Open Heart Surgery;  Laterality: N/A;  . INTRAOPERATIVE TRANSESOPHAGEAL ECHOCARDIOGRAM N/A 08/26/2013   Procedure: INTRAOPERATIVE TRANSESOPHAGEAL ECHOCARDIOGRAM;  Surgeon: [name], MD;  Location: [place];  Service: Open Heart Surgery;  Laterality: N/A;  . KNEE SURGERY    . LEFT HEART CATHETERIZATION WITH CORONARY ANGIOGRAM N/A 08/24/2013   Procedure: LEFT HEART CATHETERIZATION WITH CORONARY ANGIOGRAM;  Surgeon: [name], MD;  Location: [facility] CATH LAB;  Service: Cardiovascular;  Laterality: N/A;  . TRANSURETHRAL RESECTION OF PROSTATE      Current Medications: Outpatient Medications Prior to Visit  Medication  Sig Dispense Refill  . amoxicillin (AMOXIL) 500 MG capsule Take 4 capsules by mouth as needed. (dental procedures)  1  . aspirin EC 81 MG tablet Take 1 tablet (81 mg total) by mouth daily. 90 tablet 3  . docusate sodium (COLACE) 100 MG capsule Take 100 mg by mouth daily as needed for mild constipation.    . gabapentin (NEURONTIN) 300 MG capsule Take 300 mg by mouth 3 (three) times daily.    [name] HYDROmorphone (DILAUDID) 2 MG tablet Take 2 mg by mouth 5 (five) times daily as needed for moderate pain.     . simvastatin (ZOCOR) 20 MG tablet Take 1 tablet (20 mg total) by mouth daily. 30 tablet 4  . zolpidem (AMBIEN) 10 MG tablet Take 5 mg by mouth at bedtime.  5   No facility-administered medications prior to visit.      Allergies:   Ibuprofen; Codeine; and Hydrocodone   Social History   Social History  . Marital status: Married    Spouse name: N/A  . Number of children: N/A  . Years of education: N/A   Social History Main Topics  . Smoking status: Former Smoker    Quit date: 08/23/2013  . Smokeless tobacco: Never Used  . Alcohol use 3.5 oz/week    7 Standard drinks or equivalent per week     Comment: occ  . Drug use: No  . Sexual activity: Not Asked   Other Topics Concern  . None   Social History Narrative  .  None     Family History:  The patient's family history includes Heart attack in his father; Lung disease in his mother.   ROS:   Please see the history of present illness.    Back discomfort and difficulty with balance or other major complaints. Snoring is significant according to his family. He has leg pain possibly related to his back.  All other systems reviewed and are negative.   PHYSICAL EXAM:   VS:  BP 102/70 (BP Location: Left Arm)   Pulse 67   Ht 5\' 11"  (1.803 m)   Wt 200 lb 6.4 oz (90.9 kg)   BMI 27.95 kg/m    GEN: Well nourished, well developed, in no acute distress  HEENT: normal  Neck: no JVD, carotid bruits, or masses Cardiac: RRR; no murmurs,  rubs, or gallops,no edema  Respiratory:  clear to auscultation bilaterally, normal work of breathing GI: soft, nontender, nondistended, + BS MS: no deformity or atrophy  Skin: warm and dry, no rash Neuro:  Alert and Oriented x 3, Strength and sensation are intact Psych: euthymic mood, full affect  Wt Readings from Last 3 Encounters:  08/26/16 200 lb 6.4 oz (90.9 kg)  12/21/15 201 lb 12.8 oz (91.5 kg)  12/08/15 196 lb (88.9 kg)      Studies/Labs Reviewed:   EKG:  EKG  Normal sinus rhythm. EKG is not repeated. Last performed 12/21/2015 and was normal.  Recent Labs: 12/08/2015: Hemoglobin 16.4; Magnesium 2.4; Platelets 202 12/21/2015: BUN 10; Creat 1.30; Potassium 4.4; Sodium 138   Lipid Panel    Component Value Date/Time   CHOL 134 08/24/2014 0914   TRIG 299.0 (H) 08/24/2014 0914   HDL 29.50 (L) 08/24/2014 0914   CHOLHDL 5 08/24/2014 0914   VLDL 59.8 (H) 08/24/2014 0914   LDLCALC 69 08/24/2013 0447   LDLDIRECT 74.0 08/24/2014 0914    Additional studies/ records that were reviewed today include:  Carotid Doppler performed 08/25/16: Heterogeneous plaque, bilaterally. Stable 1-39% bilateral ICA stenosis. Normal subclavian arteries, bilaterally. Patent vertebral arteries with antegrade flow No change from one year prior. Needs to be repeated in one year.  ASSESSMENT:    1. Carotid atherosclerosis, unspecified laterality   2. CAD, multiple vessel   3. Other hyperlipidemia   4. Premature ventricular contractions      PLAN:  In order of problems listed above:  1. No significant change in carotid obstruction noted on most recent study. Needs to be followed up in one year. 2. Asymptomatic from cardiac standpoint. Aerobic activity is encouraged. LDL cholesterol less than 70 is target. 3. Discussed target LDL less than 70. 4. In May he was seen in the ED because of palpitations and found to have frequent PVCs. His seems as though this was related to steroid injections. They  gradually resolved over several days and have not returned.  Overall plan is to follow-up in one year.    Medication Adjustments/Labs and Tests Ordered: Current medicines are reviewed at length with the patient today.  Concerns regarding medicines are outlined above.  Medication changes, Labs and Tests ordered today are listed in the Patient Instructions below. Patient Instructions  Medication Instructions:  None  Labwork: None  Testing/Procedures: Plan to repeat your Carotid doppler in one year.  Follow-Up: Your physician wants you to follow-up in: 1 year with Dr. [name].  You will receive a reminder letter in the mail two months in advance. If you don't receive a letter, please call our office to schedule the follow-up  appointment.   Any Other Special Instructions Will Be Listed Below (If Applicable).     If you need a refill on your cardiac medications before your next appointment, please call your pharmacy.      Signed, [name], MD  08/26/2016 9:11 AM    [facility] [place], [place], [place]  [address] Phone: [phone]; Fax: [phone]

## 2017-01-01 ENCOUNTER — Other Ambulatory Visit: Payer: Self-pay | Admitting: *Deleted

## 2017-01-01 MED ORDER — SIMVASTATIN 20 MG PO TABS: 20.0000 mg | ORAL_TABLET | Freq: Every day | ORAL | 1 refills | Status: DC

## 2017-02-10 ENCOUNTER — Encounter: Payer: Self-pay | Admitting: *Deleted

## 2017-02-11 ENCOUNTER — Ambulatory Visit (INDEPENDENT_AMBULATORY_CARE_PROVIDER_SITE_OTHER): Payer: Medicare Other | Admitting: Nurse Practitioner

## 2017-02-11 ENCOUNTER — Encounter: Payer: Self-pay | Admitting: Nurse Practitioner

## 2017-02-11 VITALS — BP 122/70 | HR 65 | Ht 71.0 in | Wt 205.1 lb

## 2017-02-11 DIAGNOSIS — I259 Chronic ischemic heart disease, unspecified: Secondary | ICD-10-CM | POA: Diagnosis not present

## 2017-02-11 DIAGNOSIS — R002 Palpitations: Secondary | ICD-10-CM | POA: Diagnosis not present

## 2017-02-11 NOTE — Patient Instructions (Addendum)
We will be checking the following labs today - BMET and TSH   Medication Instructions:    Continue with your current medicines.     Testing/Procedures To Be Arranged:  Echocardiogram   Follow-Up:   Will see how your studies turn out and then decide about follow up     Other Special Instructions:   Limit your caffeine    If you need a refill on your cardiac medications before your next appointment, please call your pharmacy.   Call the [place] office at [phone] if you have any questions, problems or concerns.

## 2017-02-11 NOTE — Progress Notes (Signed)
CARDIOLOGY OFFICE NOTE  Date:  02/11/2017    Adrian [name] Date of Birth: 1946-01-03 Medical Record #[id number]  PCP:  [name], MD  Cardiologist:  [name]  Chief Complaint  Patient presents with  . Palpitations    Work in visit - seen for Dr. [name]    History of Present Illness: Adrian [name] is a 71 y.o. male who presents today for a 6 month check. Seen for Dr. [name].  He has a hx of CAD s/p NSTEMI 07/2013 followed by subsequent CABG. He is intolerant of statins due to myalgias. Other issues include gout.   He had palpitations in May - seen in the ER and found to be having PVCs. No specific explanation other than to temporal steroid injections for his back and for gout. It was felt that this could've been contributing to the PVCs.   Last seen back in January and he was doing well. Was to be seen back in one year.   Comes in today. Here with his wife. Notes more palpitations. But this seems different from what he had in May.  Just over the past few weeks has had a fluttery sensation in the upper chest. Occurs almost every day. No real symptoms just the awareness of the "fluttering". Can't really tell a difference in his pulse rate. Pulse seems regular and HR not really changed. No chest pain. Breathing is fine. His prior chest pain syndrome was more shortness of breath. He does not exercise due to orthopedic issues - not a surgical candidate apparently. He is on just low dose statin and doing ok. He does use caffeine - likes Coke, daily alcohol and loves sweet tea.   Past Medical History:  Diagnosis Date  . CAD, multiple vessel   . Diverticulitis   . Gout   . Hyperlipidemia   . NSTEMI (non-ST elevated myocardial infarction) ([place]) \    Past Surgical History:  Procedure Laterality Date  . CORONARY ARTERY BYPASS GRAFT N/A 08/26/2013   Procedure: Coronary Artery Bypass Graft times four using left internal mammary artery and right leg greater saphenous vein  harvested endoscopically;  Surgeon: [name], MD;  Location: [place];  Service: Open Heart Surgery;  Laterality: N/A;  . INTRAOPERATIVE TRANSESOPHAGEAL ECHOCARDIOGRAM N/A 08/26/2013   Procedure: INTRAOPERATIVE TRANSESOPHAGEAL ECHOCARDIOGRAM;  Surgeon: [name], MD;  Location: [place];  Service: Open Heart Surgery;  Laterality: N/A;  . KNEE SURGERY    . LEFT HEART CATHETERIZATION WITH CORONARY ANGIOGRAM N/A 08/24/2013   Procedure: LEFT HEART CATHETERIZATION WITH CORONARY ANGIOGRAM;  Surgeon: [name], MD;  Location: [facility] CATH LAB;  Service: Cardiovascular;  Laterality: N/A;  . TRANSURETHRAL RESECTION OF PROSTATE       Medications: Current Meds  Medication Sig  . allopurinol (ZYLOPRIM) 300 MG tablet Take 300 mg by mouth daily.   [name] amoxicillin (AMOXIL) 500 MG capsule Take 4 capsules by mouth as needed. (dental procedures)  . aspirin EC 81 MG tablet Take 1 tablet (81 mg total) by mouth daily.  [name] gabapentin (NEURONTIN) 300 MG capsule Take 300 mg by mouth 3 (three) times daily.  [name] HYDROmorphone (DILAUDID) 2 MG tablet Take 2 mg by mouth 5 (five) times daily as needed for moderate pain.   . simvastatin (ZOCOR) 20 MG tablet Take 1 tablet (20 mg total) by mouth daily.  [name] zolpidem (AMBIEN) 10 MG tablet Take 5 mg by mouth at bedtime.     Allergies: Allergies  Allergen Reactions  . Ibuprofen Other (See Comments)    Effects kidneys  . Codeine Anxiety  . Hydrocodone Anxiety    Currently taking hydrocodone (2.5-5mg ) and is okay.    Social History: The patient  reports that he quit smoking about 3 years ago. He has never used smokeless tobacco. He reports that he drinks about 3.5 oz of alcohol per week . He reports that he does not use drugs.   Family History: The patient's family history includes Heart attack in his father; Lung disease in his mother.   Review of Systems: Please see the history of present illness.   Otherwise, the review of systems is positive for  none.   All other systems are reviewed and negative.   Physical Exam: VS:  BP 122/70 (BP Location: Left Arm, Patient Position: Sitting, Cuff Size: Normal)   Pulse 65   Ht 5\' 11"  (1.803 m)   Wt 205 lb 1.9 oz (93 kg)   BMI 28.61 kg/m  .  BMI Body mass index is 28.61 kg/m.  Wt Readings from Last 3 Encounters:  02/11/17 205 lb 1.9 oz (93 kg)  08/26/16 200 lb 6.4 oz (90.9 kg)  12/21/15 201 lb 12.8 oz (91.5 kg)    General: Pleasant. Well developed, well nourished and in no acute distress.   HEENT: Normal.  Neck: Supple, no JVD, carotid bruits, or masses noted.  Cardiac: Regular rate and rhythm. No murmurs, rubs, or gallops. No edema.  Respiratory:  Lungs are clear to auscultation bilaterally with normal work of breathing.  GI: Soft and nontender.  MS: No deformity or atrophy. Gait and ROM intact.  Skin: Warm and dry. Color is normal.  Neuro:  Strength and sensation are intact and no gross focal deficits noted.  Psych: Alert, appropriate and with normal affect.   LABORATORY DATA:  EKG:  EKG is ordered today. This demonstrates NSR.  Lab Results  Component Value Date   WBC 13.1 (H) 12/08/2015   HGB 16.4 12/08/2015   HCT 47.6 12/08/2015   PLT 202 12/08/2015   GLUCOSE 93 12/21/2015   CHOL 134 08/24/2014   TRIG 299.0 (H) 08/24/2014   HDL 29.50 (L) 08/24/2014   LDLDIRECT 74.0 08/24/2014   LDLCALC 69 08/24/2013   ALT 29 08/24/2014   AST 30 04/18/2014   NA 138 12/21/2015   K 4.4 12/21/2015   CL 106 12/21/2015   CREATININE 1.30 (H) 12/21/2015   BUN 10 12/21/2015   CO2 25 12/21/2015   TSH 1.126 08/24/2013   INR 1.29 08/26/2013   HGBA1C 6.0 (H) 08/24/2013     BNP (last 3 results) No results for input(s): BNP in the last 8760 hours.  ProBNP (last 3 results) No results for input(s): PROBNP in the last 8760 hours.   Other Studies Reviewed Today:   Assessment/Plan:  1. Palpitations - will check TSH and BMET. Arrange 48 hour Holter and echo. Needs to cut back/stop  caffeine use.  2. CAD/prior CABG - no active chest pain - not really able to do CV risk factor modification due to chronic pain issues.   3. HLD - on low dose statin  4. Chronic back pain - unable to exercise - encouraged a water program.   5. Gout - may improve with cutting back on alcohol consumption.      Current medicines are reviewed with the patient today.  The patient does not have concerns regarding medicines other than what has been noted above.  The following changes have  been made:  See above.  Labs/ tests ordered today include:    Orders Placed This Encounter  Procedures  . Basic metabolic panel  . TSH  . Holter monitor - 48 hour  . EKG 12-Lead  . ECHOCARDIOGRAM COMPLETE     Disposition:   Further disposition to follow.   Patient is agreeable to this plan and will call if any problems develop in the interim.   SignedTruitt Merle, NP  02/11/2017 10:50 AM  [facility] [address] [place], [place]  [address] Phone: [phone] Fax: [phone]

## 2017-02-12 LAB — BASIC METABOLIC PANEL
BUN/Creatinine Ratio: 9 — ABNORMAL LOW (ref 10–24)
BUN: 11 mg/dL (ref 8–27)
CO2: 19 mmol/L — ABNORMAL LOW (ref 20–29)
Calcium: 9.1 mg/dL (ref 8.6–10.2)
Chloride: 102 mmol/L (ref 96–106)
Creatinine, Ser: 1.21 mg/dL (ref 0.76–1.27)
GFR calc Af Amer: 70 mL/min/{1.73_m2} (ref >59)
GFR calc non Af Amer: 61 mL/min/{1.73_m2} (ref >59)
Glucose: 103 mg/dL — ABNORMAL HIGH (ref 65–99)
Potassium: 4.2 mmol/L (ref 3.5–5.2)
Sodium: 138 mmol/L (ref 134–144)

## 2017-02-12 LAB — TSH: TSH: 2.38 u[IU]/mL (ref 0.450–4.500)

## 2017-02-20 ENCOUNTER — Other Ambulatory Visit: Payer: Self-pay

## 2017-02-20 ENCOUNTER — Ambulatory Visit (HOSPITAL_COMMUNITY): Payer: Medicare Other | Attending: Cardiology

## 2017-02-20 DIAGNOSIS — I2510 Atherosclerotic heart disease of native coronary artery without angina pectoris: Secondary | ICD-10-CM | POA: Diagnosis not present

## 2017-02-20 DIAGNOSIS — Z951 Presence of aortocoronary bypass graft: Secondary | ICD-10-CM | POA: Insufficient documentation

## 2017-02-20 DIAGNOSIS — E785 Hyperlipidemia, unspecified: Secondary | ICD-10-CM | POA: Insufficient documentation

## 2017-02-20 DIAGNOSIS — R002 Palpitations: Secondary | ICD-10-CM

## 2017-02-20 DIAGNOSIS — I252 Old myocardial infarction: Secondary | ICD-10-CM | POA: Insufficient documentation

## 2017-02-20 DIAGNOSIS — I259 Chronic ischemic heart disease, unspecified: Secondary | ICD-10-CM | POA: Insufficient documentation

## 2017-02-20 LAB — ECHOCARDIOGRAM COMPLETE
E decel time: 218 msec
E/e' ratio: 3.99
FS: 28 % (ref 28–44)
IVS/LV PW RATIO, ED: 1
LA ID, A-P, ES: 40 mm
LA diam end sys: 40 mm
LA diam index: 1.88 cm/m2
LA vol A4C: 32 ml
LA vol index: 16.4 mL/m2
LA vol: 35 mL
LV E/e' medial: 3.99
LV E/e'average: 3.99
LV PW d: 10.2 mm — AB (ref 0.6–1.1)
LV e' LATERAL: 11 cm/s
LVOT SV: 72 mL
LVOT VTI: 19 cm
LVOT area: 3.8 cm2
LVOT diameter: 22 mm
LVOT peak grad rest: 2 mmHg
LVOT peak vel: 78.5 cm/s
Lateral S' vel: 12.8 cm/s
MV Dec: 218
MV pk A vel: 62.5 m/s
MV pk E vel: 43.9 m/s
TDI e' lateral: 11
TDI e' medial: 8.9

## 2017-02-24 ENCOUNTER — Ambulatory Visit (INDEPENDENT_AMBULATORY_CARE_PROVIDER_SITE_OTHER): Payer: Medicare Other

## 2017-02-24 DIAGNOSIS — I259 Chronic ischemic heart disease, unspecified: Secondary | ICD-10-CM | POA: Diagnosis not present

## 2017-02-24 DIAGNOSIS — R002 Palpitations: Secondary | ICD-10-CM | POA: Diagnosis not present

## 2017-07-14 ENCOUNTER — Other Ambulatory Visit: Payer: Self-pay | Admitting: Interventional Cardiology

## 2017-08-27 ENCOUNTER — Ambulatory Visit (HOSPITAL_COMMUNITY)
Admission: RE | Admit: 2017-08-27 | Discharge: 2017-08-27 | Disposition: A | Discharge status: Home / Self Care | Payer: Medicare Other | Source: Ambulatory Visit | Attending: Cardiovascular Disease | Admitting: Cardiovascular Disease

## 2017-08-27 DIAGNOSIS — I6529 Occlusion and stenosis of unspecified carotid artery: Secondary | ICD-10-CM | POA: Insufficient documentation

## 2017-08-27 DIAGNOSIS — Z87891 Personal history of nicotine dependence: Secondary | ICD-10-CM | POA: Diagnosis not present

## 2017-08-27 DIAGNOSIS — I2510 Atherosclerotic heart disease of native coronary artery without angina pectoris: Secondary | ICD-10-CM | POA: Insufficient documentation

## 2017-08-27 DIAGNOSIS — E785 Hyperlipidemia, unspecified: Secondary | ICD-10-CM | POA: Diagnosis not present
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
## 2017-09-16 ENCOUNTER — Ambulatory Visit: Payer: Medicare Other | Admitting: Interventional Cardiology

## 2017-09-16 ENCOUNTER — Encounter: Payer: Self-pay | Admitting: Interventional Cardiology

## 2017-09-16 VITALS — BP 128/70 | HR 75 | Ht 71.0 in | Wt 213.4 lb

## 2017-09-16 DIAGNOSIS — R002 Palpitations: Secondary | ICD-10-CM | POA: Diagnosis not present

## 2017-09-16 DIAGNOSIS — E7849 Other hyperlipidemia: Secondary | ICD-10-CM | POA: Diagnosis not present

## 2017-09-16 DIAGNOSIS — I25709 Atherosclerosis of coronary artery bypass graft(s), unspecified, with unspecified angina pectoris: Secondary | ICD-10-CM

## 2017-09-16 NOTE — Patient Instructions (Signed)

## 2018-05-03 ENCOUNTER — Other Ambulatory Visit: Payer: Self-pay | Admitting: *Deleted

## 2018-05-03 MED ORDER — SIMVASTATIN 20 MG PO TABS: 20.0000 mg | ORAL_TABLET | Freq: Every day | ORAL | 0 refills | Status: DC

## 2018-07-19 ENCOUNTER — Other Ambulatory Visit: Payer: Self-pay | Admitting: Interventional Cardiology

## 2018-09-20 NOTE — Progress Notes (Signed)
Cardiology Office Note:    Date:  09/21/2018   ID:  Adrian [name], DOB 1946/05/04, MRN [id number]  PCP:  [name], MD  Cardiologist:  [name], MD   Referring MD: [name], MD   Chief Complaint  Patient presents with  . Coronary Artery Disease    History of Present Illness:    Adrian [name] is a 73 y.o. male with a hx of CABG 2015 following non-ST elevation MI, hyperlipidemia, intolerance of statin therapy, and gout  He is doing well.  Relatively inactive due to back and knee difficulty.  He denies angina, dyspnea on exertion, orthopnea, PND, palpitations, and syncope.  He is compliant with his current medical regimen.  He has not had any neurological complaints.  Denies claudication.  Past Medical History:  Diagnosis Date  . CAD, multiple vessel   . Diverticulitis   . Gout   . Hyperlipidemia   . NSTEMI (non-ST elevated myocardial infarction) ([place]) \    Past Surgical History:  Procedure Laterality Date  . CORONARY ARTERY BYPASS GRAFT N/A 08/26/2013   Procedure: Coronary Artery Bypass Graft times four using left internal mammary artery and right leg greater saphenous vein harvested endoscopically;  Surgeon: [name], MD;  Location: [place];  Service: Open Heart Surgery;  Laterality: N/A;  . INTRAOPERATIVE TRANSESOPHAGEAL ECHOCARDIOGRAM N/A 08/26/2013   Procedure: INTRAOPERATIVE TRANSESOPHAGEAL ECHOCARDIOGRAM;  Surgeon: [name], MD;  Location: [place];  Service: Open Heart Surgery;  Laterality: N/A;  . KNEE SURGERY    . LEFT HEART CATHETERIZATION WITH CORONARY ANGIOGRAM N/A 08/24/2013   Procedure: LEFT HEART CATHETERIZATION WITH CORONARY ANGIOGRAM;  Surgeon: [name], MD;  Location: [facility] CATH LAB;  Service: Cardiovascular;  Laterality: N/A;  . TRANSURETHRAL RESECTION OF PROSTATE      Current Medications: Current Meds  Medication Sig  . amoxicillin (AMOXIL) 500 MG capsule Take 4 capsules by mouth as needed.  (dental procedures)  . aspirin EC 81 MG tablet Take 1 tablet (81 mg total) by mouth daily.  [name] gabapentin (NEURONTIN) 300 MG capsule Take 300 mg by mouth 3 (three) times daily.  [name] HYDROmorphone (DILAUDID) 2 MG tablet Take 2 mg by mouth 5 (five) times daily as needed for moderate pain.   . simvastatin (ZOCOR) 20 MG tablet TAKE 1 TABLET BY MOUTH EVERY DAY     Allergies:   Metronidazole; Ibuprofen; Codeine; and Hydrocodone   Social History   Socioeconomic History  . Marital status: Married    Spouse name: Not on file  . Number of children: Not on file  . Years of education: Not on file  . Highest education level: Not on file  Occupational History  . Not on file  Social Needs  . Financial resource strain: Not on file  . Food insecurity:    Worry: Not on file    Inability: Not on file  . Transportation needs:    Medical: Not on file    Non-medical: Not on file  Tobacco Use  . Smoking status: Former Smoker    Last attempt to quit: 08/23/2013    Years since quitting: 5.0  . Smokeless tobacco: Never Used  Substance and Sexual Activity  . Alcohol use: Yes    Alcohol/week: 7.0 standard drinks    Types: 7 Standard drinks or equivalent per week    Comment: occ  . Drug use: No  . Sexual activity: Not on file  Lifestyle  . Physical activity:  Days per week: Not on file    Minutes per session: Not on file  . Stress: Not on file  Relationships  . Social connections:    Talks on phone: Not on file    Gets together: Not on file    Attends religious service: Not on file    Active member of club or organization: Not on file    Attends meetings of clubs or organizations: Not on file    Relationship status: Not on file  Other Topics Concern  . Not on file  Social History Narrative  . Not on file     Family History: The patient's family history includes Heart attack in his father; Lung disease in his mother.  ROS:   Please see the history of present illness.    Occasional  swelling in the left foot.  Left knee replacement 2015.  All other systems reviewed and are negative.  EKGs/Labs/Other Studies Reviewed:    The following studies were reviewed today: Denies recent cardiac.  EKG:  EKG normal sinus rhythm, incomplete right bundle, normal PR interval, and when compared to prior tracings  Recent Labs: No results found for requested labs within last 8760 hours.  Recent Lipid Panel    Component Value Date/Time   CHOL 134 08/24/2014 0914   TRIG 299.0 (H) 08/24/2014 0914   HDL 29.50 (L) 08/24/2014 0914   CHOLHDL 5 08/24/2014 0914   VLDL 59.8 (H) 08/24/2014 0914   LDLCALC 69 08/24/2013 0447   LDLDIRECT 74.0 08/24/2014 0914    Physical Exam:    VS:  BP 122/72   Pulse 65   Ht 5\' 11"  (1.803 m)   Wt 218 lb (98.9 kg)   SpO2 95%   BMI 30.40 kg/m     Wt Readings from Last 3 Encounters:  09/21/18 218 lb (98.9 kg)  09/16/17 213 lb 6.4 oz (96.8 kg)  02/11/17 205 lb 1.9 oz (93 kg)     GEN: Pale appearing. No acute distress HEENT: Normal NECK: No JVD. LYMPHATICS: No lymphadenopathy CARDIAC: RRR.  No murmur, no gallop, no edema VASCULAR: 2+ bilateral radial pulses, no bruits RESPIRATORY:  Clear to auscultation without rales, wheezing or rhonchi  ABDOMEN: Soft, non-tender, non-distended, No pulsatile mass, MUSCULOSKELETAL: No deformity  SKIN: Warm and dry NEUROLOGIC:  Alert and oriented x 3 PSYCHIATRIC:  Normal affect   ASSESSMENT:    1. NSTEMI (non-ST elevated myocardial infarction) ([place])   2. Coronary artery disease involving coronary bypass graft of native heart with angina pectoris ([place])   3. Other hyperlipidemia   4. Carotid atherosclerosis, unspecified laterality   5. Ischemic heart disease    PLAN:    In order of problems listed above:  1. No recurrent anginal symptoms.  Stable status post coronary bypass surgery. 2. Discussed secondary prevention.  Falling way short in terms of physical activity. 3. No recent LDL.  When last done at  [facility] it was 52 in 2018.  States that recent blood work at the [facility] will be brought by the office so that we can evaluate. 4. No carotid bruits are heard on exam. 5. No evidence of LV dysfunction or CHF.  Overall education and awareness concerning primary/secondary risk prevention was discussed in detail: LDL less than 70, hemoglobin A1c less than 7, blood pressure target less than 130/80 mmHg, >150 minutes of moderate aerobic activity per week, avoidance of smoking, weight control (via diet and exercise), and continued surveillance/management of/for obstructive sleep apnea.  Medication Adjustments/Labs and Tests Ordered: Current medicines are reviewed at length with the patient today.  Concerns regarding medicines are outlined above.  Orders Placed This Encounter  Procedures  . EKG 12-Lead   No orders of the defined types were placed in this encounter.   Patient Instructions  Medication Instructions:  Your physician recommends that you continue on your current medications as directed. Please refer to the Current Medication list given to you today.  If you need a refill on your cardiac medications before your next appointment, please call your pharmacy.   Lab work: None If you have labs (blood work) drawn today and your tests are completely normal, you will receive your results only by: [name] MyChart Message (if you have MyChart) OR . A paper copy in the mail If you have any lab test that is abnormal or we need to change your treatment, we will call you to review the results.  Testing/Procedures: None  Follow-Up: At [facility], you and your health needs are our priority.  As part of our continuing mission to provide you with exceptional heart care, we have created designated Provider Care Teams.  These Care Teams include your primary Cardiologist (physician) and Advanced Practice Providers (APPs -  Physician Assistants and Nurse Practitioners) who all work together  to provide you with the care you need, when you need it. You will need a follow up appointment in 12 months.  Please call our office 2 months in advance to schedule this appointment.  You may see [name], MD or one of the following Advanced Practice Providers on your designated Care Team:   Truitt Merle, NP Cecilie Kicks, NP . Kathyrn Drown, NP  Any Other Special Instructions Will Be Listed Below (If Applicable).  Your provider recommends that you maintain 150 minutes per week of moderate aerobic activity.       Signed, [name], MD  09/21/2018 9:50 AM    [place]

## 2018-09-21 ENCOUNTER — Encounter: Payer: Self-pay | Admitting: Interventional Cardiology

## 2018-09-21 ENCOUNTER — Ambulatory Visit: Payer: Medicare Other | Admitting: Interventional Cardiology

## 2018-09-21 VITALS — BP 122/72 | HR 65 | Ht 71.0 in | Wt 218.0 lb

## 2018-09-21 DIAGNOSIS — E7849 Other hyperlipidemia: Secondary | ICD-10-CM | POA: Diagnosis not present

## 2018-09-21 DIAGNOSIS — I25709 Atherosclerosis of coronary artery bypass graft(s), unspecified, with unspecified angina pectoris: Secondary | ICD-10-CM

## 2018-09-21 DIAGNOSIS — I6529 Occlusion and stenosis of unspecified carotid artery: Secondary | ICD-10-CM

## 2018-09-21 DIAGNOSIS — I259 Chronic ischemic heart disease, unspecified: Secondary | ICD-10-CM

## 2018-09-21 DIAGNOSIS — I214 Non-ST elevation (NSTEMI) myocardial infarction: Secondary | ICD-10-CM

## 2018-09-21 NOTE — Patient Instructions (Signed)
Medication Instructions:  Your physician recommends that you continue on your current medications as directed. Please refer to the Current Medication list given to you today.  If you need a refill on your cardiac medications before your next appointment, please call your pharmacy.   Lab work: None If you have labs (blood work) drawn today and your tests are completely normal, you will receive your results only by: . MyChart Message (if you have MyChart) OR . A paper copy in the mail If you have any lab test that is abnormal or we need to change your treatment, we will call you to review the results.  Testing/Procedures: None  Follow-Up: At [facility], you and your health needs are our priority.  As part of our continuing mission to provide you with exceptional heart care, we have created designated Provider Care Teams.  These Care Teams include your primary Cardiologist (physician) and Advanced Practice Providers (APPs -  Physician Assistants and Nurse Practitioners) who all work together to provide you with the care you need, when you need it. You will need a follow up appointment in 12 months.  Please call our office 2 months in advance to schedule this appointment.  You may see Henry W Smith III, MD or one of the following Advanced Practice Providers on your designated Care Team:   Lori Gerhardt, NP Laura Ingold, NP . Jill McDaniel, NP  Any Other Special Instructions Will Be Listed Below (If Applicable).  Your provider recommends that you maintain 150 minutes per week of moderate aerobic activity.    

## 2018-10-11 ENCOUNTER — Other Ambulatory Visit: Payer: Self-pay | Admitting: Interventional Cardiology

## 2019-05-27 ENCOUNTER — Telehealth: Payer: Self-pay | Admitting: Interventional Cardiology

## 2019-05-27 NOTE — Telephone Encounter (Signed)
Spoke with Dr. [name] and he said this did not sound cardiac related, sounded more like it was coming from a disc in the neck and advised pt speak to PCP.  Spoke with pt and went over recommendations. Pt verbalized understanding and was appreciative for call.

## 2019-05-27 NOTE — Telephone Encounter (Signed)
Pt states he woke up in the middle of the night from a bad dream and had pain that felt like it was running from his left carotid down to his brachial artery almost to the wrist.  This lasted about 20-30 secs.  Has since had about 5 episodes of similar feeling but only lasts about 2-3 secs and is gone.  Denies SOB, CP, vision disturbances, diaphoresis, lightheadedness or dizziness.  Had pt check vitals while on the phone: HR 64, BP 160/76.  States BP usually 120s/60s, HR usually 50s.  Pt denies any sx at this time.  Pt prefers that I wait and speak to Dr. [name] about it this afternoon.  Advised if any changes, please head to ER.  Will route to Dr. [name] in the event he can respond before coming to the office this afternoon.

## 2019-07-04 ENCOUNTER — Other Ambulatory Visit: Payer: Self-pay

## 2019-07-04 DIAGNOSIS — Z20822 Contact with and (suspected) exposure to covid-19: Secondary | ICD-10-CM

## 2019-07-05 LAB — NOVEL CORONAVIRUS, NAA: SARS-CoV-2, NAA: NOT DETECTED

## 2019-10-03 ENCOUNTER — Other Ambulatory Visit: Payer: Self-pay | Admitting: Interventional Cardiology

## 2019-10-03 NOTE — Progress Notes (Signed)
Cardiology Office Note:    Date:  10/04/2019   ID:  Adrian [name], DOB [birth year], MRN [id number]  PCP:  Patient, No Pcp Per  Cardiologist:  Adrian Grooms, MD   Referring MD: [name], MD   Chief Complaint  Patient presents with  . Coronary Artery Disease  . Shortness of Breath  . Hypertension    History of Present Illness:    Adrian [name] is a 74 y.o. male with a hx of CABG 2015 following non-ST elevation MI, hyperlipidemia, intolerance of statin therapy, and gout.  Adrian [name] is doing relatively well.  Most of his hospital/outpatient care is performed through the [facility].  We do not have up-to-date laboratory data.  Since his bypass surgery he has not had chest discomfort of concern.  He is deconditioned because he is limited by arthritis and lumbar disc disease.  He does not exercise.  He has not smoked since his myocardial infarction in 2015.  He is compliant with medication regimen.  He sleeps in a recliner.  No complaints that he snores.  Past Medical History:  Diagnosis Date  . CAD, multiple vessel   . Diverticulitis   . Gout   . Hyperlipidemia   . NSTEMI (non-ST elevated myocardial infarction) ([place]) \    Past Surgical History:  Procedure Laterality Date  . CORONARY ARTERY BYPASS GRAFT N/A 08/26/2013   Procedure: Coronary Artery Bypass Graft times four using left internal mammary artery and right leg greater saphenous vein harvested endoscopically;  Surgeon: [name], MD;  Location: [place];  Service: Open Heart Surgery;  Laterality: N/A;  . INTRAOPERATIVE TRANSESOPHAGEAL ECHOCARDIOGRAM N/A 08/26/2013   Procedure: INTRAOPERATIVE TRANSESOPHAGEAL ECHOCARDIOGRAM;  Surgeon: [name], MD;  Location: [place];  Service: Open Heart Surgery;  Laterality: N/A;  . KNEE SURGERY    . LEFT HEART CATHETERIZATION WITH CORONARY ANGIOGRAM N/A 08/24/2013   Procedure: LEFT HEART CATHETERIZATION WITH CORONARY ANGIOGRAM;  Surgeon: Adrian Grooms,  MD;  Location: [facility] CATH LAB;  Service: Cardiovascular;  Laterality: N/A;  . TRANSURETHRAL RESECTION OF PROSTATE      Current Medications: Current Meds  Medication Sig  . allopurinol (ZYLOPRIM) 300 MG tablet Take 300 mg by mouth daily.   [name] amoxicillin (AMOXIL) 500 MG capsule Take 4 capsules by mouth as needed. (dental procedures)  . aspirin EC 81 MG tablet Take 1 tablet (81 mg total) by mouth daily.  . cholecalciferol (VITAMIN D3) 25 MCG (1000 UNIT) tablet Take 1,000 Units by mouth daily.  [name] gabapentin (NEURONTIN) 300 MG capsule Take 300 mg by mouth 4 (four) times daily.   [name] HYDROmorphone (DILAUDID) 2 MG tablet Take 2 mg by mouth 5 (five) times daily as needed for moderate pain.   . simvastatin (ZOCOR) 20 MG tablet TAKE 1 TABLET BY MOUTH EVERY DAY     Allergies:   Metronidazole, Ibuprofen, Iodine, Codeine, and Hydrocodone   Social History   Socioeconomic History  . Marital status: Married    Spouse name: Not on file  . Number of children: Not on file  . Years of education: Not on file  . Highest education level: Not on file  Occupational History  . Not on file  Tobacco Use  . Smoking status: Former Smoker    Quit date: 08/23/2013    Years since quitting: 6.1  . Smokeless tobacco: Never Used  Substance and Sexual Activity  . Alcohol use: Yes    Alcohol/week: 7.0 standard drinks  Types: 7 Standard drinks or equivalent per week    Comment: occ  . Drug use: No  . Sexual activity: Not on file  Other Topics Concern  . Not on file  Social History Narrative  . Not on file   Social Determinants of Health   Financial Resource Strain:   . Difficulty of Paying Living Expenses: Not on file  Food Insecurity:   . Worried About Charity fundraiser in the Last Year: Not on file  . Ran Out of Food in the Last Year: Not on file  Transportation Needs:   . Lack of Transportation (Medical): Not on file  . Lack of Transportation (Non-Medical): Not on file  Physical Activity:   . Days  of Exercise per Week: Not on file  . Minutes of Exercise per Session: Not on file  Stress:   . Feeling of Stress : Not on file  Social Connections:   . Frequency of Communication with Friends and Family: Not on file  . Frequency of Social Gatherings with Friends and Family: Not on file  . Attends Religious Services: Not on file  . Active Member of Clubs or Organizations: Not on file  . Attends Archivist Meetings: Not on file  . Marital Status: Not on file     Family History: The patient's family history includes Heart attack in his father; Lung disease in his mother.  ROS:   Please see the history of present illness.    Arthritis both knees, lower back, hands.  Physically inactive.  All other systems reviewed and are negative.  EKGs/Labs/Other Studies Reviewed:    The following studies were reviewed today: No new data  EKG:  EKG normal sinus rhythm, inferior ST abnormality.  Incomplete right bundle.  No acute change and no change when compared to prior.  Recent Labs: No results found for requested labs within last 8760 hours.  Recent Lipid Panel    Component Value Date/Time   CHOL 134 08/24/2014 0914   TRIG 299.0 (H) 08/24/2014 0914   HDL 29.50 (L) 08/24/2014 0914   CHOLHDL 5 08/24/2014 0914   VLDL 59.8 (H) 08/24/2014 0914   LDLCALC 69 08/24/2013 0447   LDLDIRECT 74.0 08/24/2014 0914    Physical Exam:    VS:  BP 132/80   Pulse 63   Ht 5\' 11"  (1.803 m)   Wt 209 lb (94.8 kg)   SpO2 96%   BMI 29.15 kg/m     Wt Readings from Last 3 Encounters:  10/04/19 209 lb (94.8 kg)  09/21/18 218 lb (98.9 kg)  09/16/17 213 lb 6.4 oz (96.8 kg)     GEN: Moderate obesity. No acute distress HEENT: Normal NECK: No JVD. LYMPHATICS: No lymphadenopathy CARDIAC:  RRR without murmur, gallop, or edema. VASCULAR:  Normal Pulses. No bruits. RESPIRATORY:  Clear to auscultation without rales, wheezing or rhonchi  ABDOMEN: Soft, non-tender, non-distended, No pulsatile  mass, MUSCULOSKELETAL: No deformity  SKIN: Warm and dry NEUROLOGIC:  Alert and oriented x 3 PSYCHIATRIC:  Normal affect   ASSESSMENT:    1. Coronary artery disease involving coronary bypass graft of native heart with angina pectoris ([place])   2. Other hyperlipidemia   3. Carotid atherosclerosis, unspecified laterality   4. Ischemic heart disease   5. Educated about COVID-19 virus infection    PLAN:    In order of problems listed above:  1. Secondary prevention discussed.  He needs to find a way to get 150 minutes of moderate activity  per week.  I recommended water aerobics. 2. LDL target less than 70.  Lab work will be obtained today.  Most recent LDL was 65 in 2018. 3. Secondary prevention.  No neurological complaints. 4. Last LVEF was low normal.  No repeat imaging at this time. 5. COVID-19 vaccine, and the 3W's are being adhered to.  Overall education and awareness concerning primary/secondary risk prevention was discussed in detail: LDL less than 70, hemoglobin A1c less than 7, blood pressure target less than 130/80 mmHg, >150 minutes of moderate aerobic activity per week, avoidance of smoking, weight control (via diet and exercise), and continued surveillance/management of/for obstructive sleep apnea.   CBC, comprehensive metabolic panel, LDL, hemoglobin A1c are obtained today.  Medication Adjustments/Labs and Tests Ordered: Current medicines are reviewed at length with the patient today.  Concerns regarding medicines are outlined above.  Orders Placed This Encounter  Procedures  . HgB A1c  . CBC  . Basic metabolic panel  . Hepatic function panel  . Lipid panel  . EKG 12-Lead   No orders of the defined types were placed in this encounter.   Patient Instructions  Medication Instructions:  Your physician recommends that you continue on your current medications as directed. Please refer to the Current Medication list given to you today.  *If you need a refill on your  cardiac medications before your next appointment, please call your pharmacy*   Lab Work: Liver, Lipid, A1C, CBC and BMET today  If you have labs (blood work) drawn today and your tests are completely normal, you will receive your results only by: [name] MyChart Message (if you have MyChart) OR . A paper copy in the mail If you have any lab test that is abnormal or we need to change your treatment, we will call you to review the results.   Testing/Procedures: None   Follow-Up: At [facility], you and your health needs are our priority.  As part of our continuing mission to provide you with exceptional heart care, we have created designated Provider Care Teams.  These Care Teams include your primary Cardiologist (physician) and Advanced Practice Providers (APPs -  Physician Assistants and Nurse Practitioners) who all work together to provide you with the care you need, when you need it.  We recommend signing up for the patient portal called "MyChart".  Sign up information is provided on this After Visit Summary.  MyChart is used to connect with patients for Virtual Visits (Telemedicine).  Patients are able to view lab/test results, encounter notes, upcoming appointments, etc.  Non-urgent messages can be sent to your provider as well.   To learn more about what you can do with MyChart, go to [email or web address].    Your next appointment:   12 month(s)  The format for your next appointment:   In Person  Provider:   You may see Adrian Grooms, MD or one of the following Advanced Practice Providers on your designated Care Team:    Truitt Merle, NP  Cecilie Kicks, NP  Kathyrn Drown, NP    Other Instructions      Signed, Adrian Grooms, MD  10/04/2019 5:06 PM    [place]

## 2019-10-04 ENCOUNTER — Ambulatory Visit: Payer: Medicare Other | Admitting: Interventional Cardiology

## 2019-10-04 ENCOUNTER — Other Ambulatory Visit: Payer: Self-pay

## 2019-10-04 ENCOUNTER — Encounter: Payer: Self-pay | Admitting: Interventional Cardiology

## 2019-10-04 VITALS — BP 132/80 | HR 63 | Ht 71.0 in | Wt 209.0 lb

## 2019-10-04 DIAGNOSIS — I6529 Occlusion and stenosis of unspecified carotid artery: Secondary | ICD-10-CM | POA: Diagnosis not present

## 2019-10-04 DIAGNOSIS — I25709 Atherosclerosis of coronary artery bypass graft(s), unspecified, with unspecified angina pectoris: Secondary | ICD-10-CM | POA: Diagnosis not present

## 2019-10-04 DIAGNOSIS — E7849 Other hyperlipidemia: Secondary | ICD-10-CM

## 2019-10-04 DIAGNOSIS — I259 Chronic ischemic heart disease, unspecified: Secondary | ICD-10-CM | POA: Diagnosis not present

## 2019-10-04 DIAGNOSIS — Z7189 Other specified counseling: Secondary | ICD-10-CM

## 2019-10-04 NOTE — Patient Instructions (Signed)
Medication Instructions:  Your physician recommends that you continue on your current medications as directed. Please refer to the Current Medication list given to you today.  *If you need a refill on your cardiac medications before your next appointment, please call your pharmacy*   Lab Work: Liver, Lipid, A1C, CBC and BMET today  If you have labs (blood work) drawn today and your tests are completely normal, you will receive your results only by: [name] MyChart Message (if you have MyChart) OR . A paper copy in the mail If you have any lab test that is abnormal or we need to change your treatment, we will call you to review the results.   Testing/Procedures: None   Follow-Up: At [facility], you and your health needs are our priority.  As part of our continuing mission to provide you with exceptional heart care, we have created designated Provider Care Teams.  These Care Teams include your primary Cardiologist (physician) and Advanced Practice Providers (APPs -  Physician Assistants and Nurse Practitioners) who all work together to provide you with the care you need, when you need it.  We recommend signing up for the patient portal called "MyChart".  Sign up information is provided on this After Visit Summary.  MyChart is used to connect with patients for Virtual Visits (Telemedicine).  Patients are able to view lab/test results, encounter notes, upcoming appointments, etc.  Non-urgent messages can be sent to your provider as well.   To learn more about what you can do with MyChart, go to [email or web address].    Your next appointment:   12 month(s)  The format for your next appointment:   In Person  Provider:   You may see [name], MD or one of the following Advanced Practice Providers on your designated Care Team:    Truitt Merle, NP  Cecilie Kicks, NP  Kathyrn Drown, NP    Other Instructions

## 2019-10-05 LAB — BASIC METABOLIC PANEL
BUN/Creatinine Ratio: 9 — ABNORMAL LOW (ref 10–24)
BUN: 10 mg/dL (ref 8–27)
CO2: 21 mmol/L (ref 20–29)
Calcium: 9.2 mg/dL (ref 8.6–10.2)
Chloride: 103 mmol/L (ref 96–106)
Creatinine, Ser: 1.1 mg/dL (ref 0.76–1.27)
GFR calc Af Amer: 77 mL/min/{1.73_m2} (ref >59)
GFR calc non Af Amer: 67 mL/min/{1.73_m2} (ref >59)
Glucose: 124 mg/dL — ABNORMAL HIGH (ref 65–99)
Potassium: 4.5 mmol/L (ref 3.5–5.2)
Sodium: 140 mmol/L (ref 134–144)

## 2019-10-05 LAB — LIPID PANEL
Chol/HDL Ratio: 4.7 ratio (ref 0.0–5.0)
Cholesterol, Total: 121 mg/dL (ref 100–199)
HDL: 26 mg/dL — ABNORMAL LOW (ref >39)
LDL Chol Calc (NIH): 64 mg/dL (ref 0–99)
Triglycerides: 182 mg/dL — ABNORMAL HIGH (ref 0–149)
VLDL Cholesterol Cal: 31 mg/dL (ref 5–40)

## 2019-10-05 LAB — CBC
Hematocrit: 44.2 % (ref 37.5–51.0)
Hemoglobin: 15.3 g/dL (ref 13.0–17.7)
MCH: 30.8 pg (ref 26.6–33.0)
MCHC: 34.6 g/dL (ref 31.5–35.7)
MCV: 89 fL (ref 79–97)
Platelets: 188 10*3/uL (ref 150–450)
RBC: 4.96 x10E6/uL (ref 4.14–5.80)
RDW: 13.3 % (ref 11.6–15.4)
WBC: 9.5 10*3/uL (ref 3.4–10.8)

## 2019-10-05 LAB — HEPATIC FUNCTION PANEL
ALT: 40 IU/L (ref 0–44)
AST: 57 IU/L — ABNORMAL HIGH (ref 0–40)
Albumin: 4.4 g/dL (ref 3.7–4.7)
Alkaline Phosphatase: 68 IU/L (ref 39–117)
Bilirubin Total: 1.5 mg/dL — ABNORMAL HIGH (ref 0.0–1.2)
Bilirubin, Direct: 0.39 mg/dL (ref 0.00–0.40)
Total Protein: 7.4 g/dL (ref 6.0–8.5)

## 2019-10-05 LAB — HEMOGLOBIN A1C
Est. average glucose Bld gHb Est-mCnc: 148 mg/dL
Hgb A1c MFr Bld: 6.8 % — ABNORMAL HIGH (ref 4.8–5.6)

## 2019-12-25 ENCOUNTER — Other Ambulatory Visit: Payer: Self-pay | Admitting: Interventional Cardiology

## 2020-10-02 ENCOUNTER — Other Ambulatory Visit: Payer: Self-pay | Admitting: Interventional Cardiology

## 2020-11-05 ENCOUNTER — Ambulatory Visit: Payer: Medicare Other | Admitting: Interventional Cardiology

## 2020-11-08 ENCOUNTER — Other Ambulatory Visit: Payer: Self-pay

## 2020-11-08 ENCOUNTER — Ambulatory Visit: Payer: Medicare Other | Admitting: Interventional Cardiology

## 2020-11-08 ENCOUNTER — Encounter: Payer: Self-pay | Admitting: Interventional Cardiology

## 2020-11-08 VITALS — BP 110/60 | HR 66 | Ht 71.0 in | Wt 207.0 lb

## 2020-11-08 DIAGNOSIS — I25709 Atherosclerosis of coronary artery bypass graft(s), unspecified, with unspecified angina pectoris: Secondary | ICD-10-CM | POA: Diagnosis not present

## 2020-11-08 DIAGNOSIS — E7849 Other hyperlipidemia: Secondary | ICD-10-CM | POA: Diagnosis not present

## 2020-11-08 DIAGNOSIS — I6529 Occlusion and stenosis of unspecified carotid artery: Secondary | ICD-10-CM | POA: Diagnosis not present

## 2020-11-08 NOTE — Progress Notes (Signed)
Cardiology Office Note:    Date:  11/08/2020   ID:  Adrian [name], DOB 09/10/1945, MRN [id number]  PCP:  Mackey Birchwood, NP  Cardiologist:  [name], MD   Referring MD: Mackey Birchwood, NP   Chief Complaint  Patient presents with  . Coronary Artery Disease    History of Present Illness:    Adrian [name] is a 75 y.o. male with a hx of CABG 2015 following non-ST elevation MI, hyperlipidemia, and gout.  He has difficulty with his back and therefore is not able to do much exercise.  States he has a sizable honeydew list that keeps him active.  He regrets having retired from work but it was forced because of his back.  Since his coronary artery bypass grafting 8 years ago, he has done well.  He denies angina.  He is having no side effects to his medications.  He is intolerant of statins.   Past Medical History:  Diagnosis Date  . CAD, multiple vessel   . Diverticulitis   . Gout   . Hyperlipidemia   . NSTEMI (non-ST elevated myocardial infarction) ([place]) \    Past Surgical History:  Procedure Laterality Date  . CORONARY ARTERY BYPASS GRAFT N/A 08/26/2013   Procedure: Coronary Artery Bypass Graft times four using left internal mammary artery and right leg greater saphenous vein harvested endoscopically;  Surgeon: [name], MD;  Location: [place];  Service: Open Heart Surgery;  Laterality: N/A;  . INTRAOPERATIVE TRANSESOPHAGEAL ECHOCARDIOGRAM N/A 08/26/2013   Procedure: INTRAOPERATIVE TRANSESOPHAGEAL ECHOCARDIOGRAM;  Surgeon: [name], MD;  Location: [place];  Service: Open Heart Surgery;  Laterality: N/A;  . KNEE SURGERY    . LEFT HEART CATHETERIZATION WITH CORONARY ANGIOGRAM N/A 08/24/2013   Procedure: LEFT HEART CATHETERIZATION WITH CORONARY ANGIOGRAM;  Surgeon: [name], MD;  Location[facility] LAB;  Service: Cardiovascular;  Laterality: N/A;  . TRANSURETHRAL RESECTION OF PROSTATE      Current Medications: Current Meds   Medication Sig  . allopurinol (ZYLOPRIM) 300 MG tablet Take 300 mg by mouth daily.   [name] amoxicillin (AMOXIL) 500 MG capsule Take 4 capsules by mouth as needed. (dental procedures)  . aspirin EC 81 MG tablet Take 1 tablet (81 mg total) by mouth daily.  . cholecalciferol (VITAMIN D3) 25 MCG (1000 UNIT) tablet Take 1,000 Units by mouth daily.  [name] gabapentin (NEURONTIN) 300 MG capsule Take 300 mg by mouth 4 (four) times daily.   [name] HYDROmorphone (DILAUDID) 2 MG tablet Take 2 mg by mouth 5 (five) times daily as needed for moderate pain.   . simvastatin (ZOCOR) 20 MG tablet TAKE 1 TABLET BY MOUTH EVERY DAY     Allergies:   Metronidazole, Ibuprofen, Iodine, Codeine, and Hydrocodone   Social History   Socioeconomic History  . Marital status: Married    Spouse name: Not on file  . Number of children: Not on file  . Years of education: Not on file  . Highest education level: Not on file  Occupational History  . Not on file  Tobacco Use  . Smoking status: Former Smoker    Quit date: 08/23/2013    Years since quitting: 7.2  . Smokeless tobacco: Never Used  Substance and Sexual Activity  . Alcohol use: Yes    Alcohol/week: 7.0 standard drinks    Types: 7 Standard drinks or equivalent per week    Comment: occ  . Drug use: No  . Sexual activity: Not  on file  Other Topics Concern  . Not on file  Social History Narrative  . Not on file   Social Determinants of Health   Financial Resource Strain: Not on file  Food Insecurity: Not on file  Transportation Needs: Not on file  Physical Activity: Not on file  Stress: Not on file  Social Connections: Not on file     Family History: The patient's family history includes Heart attack in his father; Lung disease in his mother.  ROS:   Please see the history of present illness.    Low back discomfort, bilateral sciatica.  all other systems reviewed and are negative.  EKGs/Labs/Other Studies Reviewed:    The following studies were reviewed  today: No new data  EKG:  EKG normal sinus rhythm.  Normal EKG.  Recent Labs: No results found for requested labs within last 8760 hours.  Recent Lipid Panel    Component Value Date/Time   CHOL 121 10/04/2019 1632   TRIG 182 (H) 10/04/2019 1632   HDL 26 (L) 10/04/2019 1632   CHOLHDL 4.7 10/04/2019 1632   CHOLHDL 5 08/24/2014 0914   VLDL 59.8 (H) 08/24/2014 0914   LDLCALC 64 10/04/2019 1632   LDLDIRECT 74.0 08/24/2014 0914    Physical Exam:    VS:  BP 110/60 (BP Location: Left Arm, Patient Position: Sitting, Cuff Size: Normal)   Pulse 66   Ht 5\' 11"  (1.803 m)   Wt 207 lb (93.9 kg)   SpO2 95%   BMI 28.87 kg/m     Wt Readings from Last 3 Encounters:  11/08/20 207 lb (93.9 kg)  10/04/19 209 lb (94.8 kg)  09/21/18 218 lb (98.9 kg)     GEN: Obese. No acute distress HEENT: Normal NECK: No JVD. LYMPHATICS: No lymphadenopathy CARDIAC: No murmur. RRR no gallop, or edema. VASCULAR:  Normal Pulses. No bruits. RESPIRATORY:  Clear to auscultation without rales, wheezing or rhonchi  ABDOMEN: Soft, non-tender, non-distended, No pulsatile mass, MUSCULOSKELETAL: No deformity  SKIN: Warm and dry NEUROLOGIC:  Alert and oriented x 3 PSYCHIATRIC:  Normal affect   ASSESSMENT:    1. Coronary artery disease involving coronary bypass graft of native heart with angina pectoris ([place])   2. Other hyperlipidemia   3. Carotid atherosclerosis, unspecified laterality    PLAN:    In order of problems listed above:  Overall education and awareness concerning primary/secondary risk prevention was discussed in detail: LDL less than 70, hemoglobin A1c less than 7, blood pressure target less than 130/80 mmHg, >150 minutes of moderate aerobic activity per week, avoidance of smoking, weight control (via diet and exercise), and continued surveillance/management of/for obstructive sleep apnea. Continue simvastatin 20 mg/day Continue secondary prevention   Overall education and awareness  concerning primary/secondary risk prevention was discussed in detail: LDL less than 70, hemoglobin A1c less than 7, blood pressure target less than 130/80 mmHg, >150 minutes of moderate aerobic activity per week, avoidance of smoking, weight control (via diet and exercise), and continued surveillance/management of/for obstructive sleep apnea.    Medication Adjustments/Labs and Tests Ordered: Current medicines are reviewed at length with the patient today.  Concerns regarding medicines are outlined above.  Orders Placed This Encounter  Procedures  . EKG 12-Lead   No orders of the defined types were placed in this encounter.   Patient Instructions  Medication Instructions:  Your physician recommends that you continue on your current medications as directed. Please refer to the Current Medication list given to you today.  *If  you need a refill on your cardiac medications before your next appointment, please call your pharmacy*   Lab Work: None If you have labs (blood work) drawn today and your tests are completely normal, you will receive your results only by: [name] MyChart Message (if you have MyChart) OR . A paper copy in the mail If you have any lab test that is abnormal or we need to change your treatment, we will call you to review the results.   Testing/Procedures: None   Follow-Up: At [facility] you and your health needs are our priority.  As part of our continuing mission to provide you with exceptional heart care, we have created designated Provider Care Teams.  These Care Teams include your primary Cardiologist (physician) and Advanced Practice Providers (APPs -  Physician Assistants and Nurse Practitioners) who all work together to provide you with the care you need, when you need it.  We recommend signing up for the patient portal called "MyChart".  Sign up information is provided on this After Visit Summary.  MyChart is used to connect with patients for Virtual Visits  (Telemedicine).  Patients are able to view lab/test results, encounter notes, upcoming appointments, etc.  Non-urgent messages can be sent to your provider as well.   To learn more about what you can do with MyChart, go to [email or web address].    Your next appointment:   1 year(s)  The format for your next appointment:   In Person  Provider:   You may see [name], MD or one of the following Advanced Practice Providers on your designated Care Team:    Kathyrn Drown, NP    Other Instructions      Signed, [name], MD  11/08/2020 4:14 PM    [place]

## 2020-11-08 NOTE — Patient Instructions (Signed)

## 2020-12-31 ENCOUNTER — Other Ambulatory Visit: Payer: Self-pay | Admitting: Interventional Cardiology

## 2021-03-19 ENCOUNTER — Encounter (HOSPITAL_COMMUNITY): Payer: Self-pay

## 2021-03-19 ENCOUNTER — Other Ambulatory Visit: Payer: Self-pay

## 2021-03-19 ENCOUNTER — Emergency Department (HOSPITAL_COMMUNITY)
Admission: EM | Admit: 2021-03-19 | Discharge: 2021-03-19 | Disposition: A | Discharge status: Home / Self Care | Payer: No Typology Code available for payment source | Attending: Emergency Medicine | Admitting: Emergency Medicine

## 2021-03-19 DIAGNOSIS — Z5321 Procedure and treatment not carried out due to patient leaving prior to being seen by health care provider: Secondary | ICD-10-CM | POA: Diagnosis not present

## 2021-03-19 DIAGNOSIS — R739 Hyperglycemia, unspecified: Secondary | ICD-10-CM | POA: Insufficient documentation

## 2021-03-19 LAB — BASIC METABOLIC PANEL
Anion gap: 12 (ref 5–15)
BUN: 17 mg/dL (ref 8–23)
CO2: 26 mmol/L (ref 22–32)
Calcium: 9.5 mg/dL (ref 8.9–10.3)
Chloride: 96 mmol/L — ABNORMAL LOW (ref 98–111)
Creatinine, Ser: 1.03 mg/dL (ref 0.61–1.24)
GFR, Estimated: >60 mL/min (ref >60)
Glucose, Bld: 310 mg/dL — ABNORMAL HIGH (ref 70–99)
Potassium: 4.2 mmol/L (ref 3.5–5.1)
Sodium: 134 mmol/L — ABNORMAL LOW (ref 135–145)

## 2021-03-19 LAB — CBC
HCT: 45.7 % (ref 39.0–52.0)
Hemoglobin: 16 g/dL (ref 13.0–17.0)
MCH: 30.4 pg (ref 26.0–34.0)
MCHC: 35 g/dL (ref 30.0–36.0)
MCV: 86.9 fL (ref 80.0–100.0)
Platelets: 148 10*3/uL — ABNORMAL LOW (ref 150–400)
RBC: 5.26 MIL/uL (ref 4.22–5.81)
RDW: 12.7 % (ref 11.5–15.5)
WBC: 10.8 10*3/uL — ABNORMAL HIGH (ref 4.0–10.5)
nRBC: 0 % (ref 0.0–0.2)

## 2021-03-19 LAB — CBG MONITORING, ED: Glucose-Capillary: 309 mg/dL — ABNORMAL HIGH (ref 70–99)

## 2021-03-19 NOTE — ED Provider Notes (Signed)
Emergency Medicine Provider Triage Evaluation Note  Adrian [name] , a 75 y.o. male  was evaluated in triage.  Pt complains of excessive thirst, weight loss (25lbs in 30 days). Sent by PCP for BS > 500  Review of Systems  Positive: Excessive thirst Negative: Fever, new pain  Physical Exam  BP (!) 152/80 (BP Location: Right Arm)   Pulse 65   Temp 98.1 F (36.7 C) (Oral)   Resp 14   Ht '5\' 11"'$  (1.803 m)   Wt 85.8 kg   SpO2 98%   BMI 26.38 kg/m  Gen:   Awake, no distress   Resp:  Normal effort  MSK:   Moves extremities without difficulty  Other:    Medical Decision Making  Medically screening exam initiated at 11:50 AM.  Appropriate orders placed.  [name] was informed that the remainder of the evaluation will be completed by another provider, this initial triage assessment does not replace that evaluation, and the importance of remaining in the ED until their evaluation is complete.    Pati Gallo [place], [place] 03/19/21 1151    Tegeler, Gwenyth Allegra, MD 03/19/21 [phone]

## 2021-03-19 NOTE — ED Triage Notes (Signed)
Patient reports that he went to his PCP yesterday with c/o weight loss and excessive thirst. Patient states his blood sugar was >500 and his A1C was elevated.  CBG- 309 in triage.

## 2021-06-27 DIAGNOSIS — J189 Pneumonia, unspecified organism: Secondary | ICD-10-CM

## 2021-06-27 HISTORY — DX: Pneumonia, unspecified organism: J18.9

## 2021-09-26 ENCOUNTER — Other Ambulatory Visit: Payer: Self-pay | Admitting: Interventional Cardiology

## 2021-11-23 NOTE — Progress Notes (Signed)
?Cardiology Office Note:   ? ?Date:  11/25/2021  ? ?ID:  Adrian [name], DOB 11-04-1945, MRN [id number] ? ?PCP:  Mackey Birchwood, NP  ?Cardiologist:  [name], MD  ? ?Referring MD: Mackey Birchwood, NP  ? ?Chief Complaint  ?Patient presents with  ? Coronary Artery Disease  ? Follow-up  ?  Preoperative clearance for noncardiac surgery  ? ? ?History of Present Illness:   ? ?Adrian [name] is a 76 y.o. male with a hx of CABG 2015 following non-ST elevation MI, hyperlipidemia, and gout. ? ? ?Very limited in physical activity due to chronic lower back discomfort and spinal stenosis. ? ?He denies angina pectoris.  He has not had any significant episodes of palpitation, dyspnea, or symptoms that sound like claudication.  No medication side effects. ? ?He is concerned about an upcoming inguinal herniorrhaphy that will be laparoscopic and an outpatient at the [facility].  He questions whether he is fit for the procedure. ? ?Past Medical History:  ?Diagnosis Date  ? CAD, multiple vessel   ? Diverticulitis   ? Gout   ? Hyperlipidemia   ? NSTEMI (non-ST elevated myocardial infarction) ([place]) \  ? ? ?Past Surgical History:  ?Procedure Laterality Date  ? CORONARY ARTERY BYPASS GRAFT N/A 08/26/2013  ? Procedure: Coronary Artery Bypass Graft times four using left internal mammary artery and right leg greater saphenous vein harvested endoscopically;  Surgeon: [name], MD;  Location: [place];  Service: Open Heart Surgery;  Laterality: N/A;  ? INTRAOPERATIVE TRANSESOPHAGEAL ECHOCARDIOGRAM N/A 08/26/2013  ? Procedure: INTRAOPERATIVE TRANSESOPHAGEAL ECHOCARDIOGRAM;  Surgeon: [name], MD;  Location: [place];  Service: Open Heart Surgery;  Laterality: N/A;  ? KNEE SURGERY    ? LEFT HEART CATHETERIZATION WITH CORONARY ANGIOGRAM N/A 08/24/2013  ? Procedure: LEFT HEART CATHETERIZATION WITH CORONARY ANGIOGRAM;  Surgeon: [name], MD;  Location: [facility] CATH LAB;  Service: Cardiovascular;   Laterality: N/A;  ? TRANSURETHRAL RESECTION OF PROSTATE    ? ? ?Current Medications: ?Current Meds  ?Medication Sig  ? allopurinol (ZYLOPRIM) 300 MG tablet Take 300 mg by mouth daily.   ? amoxicillin (AMOXIL) 500 MG capsule Take 4 capsules by mouth as needed. (dental procedures)  ? aspirin EC 81 MG tablet Take 1 tablet (81 mg total) by mouth daily.  ? cholecalciferol (VITAMIN D3) 25 MCG (1000 UNIT) tablet Take 1,000 Units by mouth daily.  ? empagliflozin (JARDIANCE) 25 MG TABS tablet Take 0.5 tablets by mouth daily.  ? gabapentin (NEURONTIN) 300 MG capsule Take 300 mg by mouth 4 (four) times daily.   ? HYDROmorphone (DILAUDID) 2 MG tablet Take 2 mg by mouth 5 (five) times daily as needed for moderate pain.   ? metFORMIN (GLUCOPHAGE-XR) 500 MG 24 hr tablet Take 500 mg by mouth 2 (two) times daily.  ? simvastatin (ZOCOR) 20 MG tablet TAKE 1 TABLET BY MOUTH DAILY AT 6 PM.  ?  ? ?Allergies:   Metronidazole, Ibuprofen, Iodine, Codeine, and Hydrocodone  ? ?Social History  ? ?Socioeconomic History  ? Marital status: Married  ?  Spouse name: Not on file  ? Number of children: Not on file  ? Years of education: Not on file  ? Highest education level: Not on file  ?Occupational History  ? Not on file  ?Tobacco Use  ? Smoking status: Former  ?  Types: Cigarettes  ?  Quit date: 08/23/2013  ?  Years since quitting: 8.2  ? Smokeless tobacco: Never  ?  Vaping Use  ? Vaping Use: Never used  ?Substance and Sexual Activity  ? Alcohol use: Yes  ?  Alcohol/week: 7.0 standard drinks  ?  Types: 7 Standard drinks or equivalent per week  ?  Comment: occ  ? Drug use: No  ? Sexual activity: Not on file  ?Other Topics Concern  ? Not on file  ?Social History Narrative  ? Not on file  ? ?Social Determinants of Health  ? ?Financial Resource Strain: Not on file  ?Food Insecurity: Not on file  ?Transportation Needs: Not on file  ?Physical Activity: Not on file  ?Stress: Not on file  ?Social Connections: Not on file  ?  ? ?Family History: ?The  patient's family history includes Heart attack in his father; Lung disease in his mother. ? ?ROS:   ?Please see the history of present illness.    ?Suffered pneumonia related to a pill aspiration earlier this year.  Was treated as an outpatient.  Having pain from an inguinal hernia.  Plans to have surgery performed.  All other systems reviewed and are negative. ? ?EKGs/Labs/Other Studies Reviewed:   ? ?The following studies were reviewed today: ?No recent imaging or functional testing. ? ?EKG:  EKG normal sinus rhythm, normal overall EKG appearance.  Horizontal axis.  Compared to November 08, 2020, no changes noted. ? ?Recent Labs: ?03/19/2021: BUN 17; Creatinine, Ser 1.03; Hemoglobin 16.0; Platelets 148; Potassium 4.2; Sodium 134  ?Recent Lipid Panel ?   ?Component Value Date/Time  ? CHOL 121 10/04/2019 1632  ? TRIG 182 (H) 10/04/2019 1632  ? HDL 26 (L) 10/04/2019 1632  ? CHOLHDL 4.7 10/04/2019 1632  ? CHOLHDL 5 08/24/2014 0914  ? VLDL 59.8 (H) 08/24/2014 0914  ? [place] 64 10/04/2019 1632  ? LDLDIRECT 74.0 08/24/2014 0914  ? ? ?Physical Exam:   ? ?VS:  BP 110/62   Pulse 63   Ht '5\' 11"'$  (1.803 m)   Wt 194 lb 6.4 oz (88.2 kg)   SpO2 95%   BMI 27.11 kg/m?    ? ?Wt Readings from Last 3 Encounters:  ?11/25/21 194 lb 6.4 oz (88.2 kg)  ?03/19/21 189 lb 2 oz (85.8 kg)  ?11/08/20 207 lb (93.9 kg)  ?  ? ?GEN: Overweight. No acute distress ?HEENT: Normal ?NECK: No JVD. ?LYMPHATICS: No lymphadenopathy ?CARDIAC: No murmur. RRR no gallop, or edema. ?VASCULAR:  Normal Pulses. No bruits. ?RESPIRATORY:  Clear to auscultation without rales, wheezing or rhonchi  ?ABDOMEN: Soft, non-tender, non-distended, No pulsatile mass, ?MUSCULOSKELETAL: No deformity  ?SKIN: Warm and dry ?NEUROLOGIC:  Alert and oriented x 3 ?PSYCHIATRIC:  Normal affect  ? ?ASSESSMENT:   ? ?1. Coronary artery disease involving coronary bypass graft of native heart with angina pectoris ([place])   ?2. Other hyperlipidemia   ?3. Carotid atherosclerosis, unspecified  laterality   ?4. Preoperative clearance   ? ?PLAN:   ? ?In order of problems listed above: ? ?Secondary prevention reviewed.  Unable to get in aerobic activity as needed for prevention due to significant back and knee limitations and arthritis. ?Continue moderate dose statin therapy in the form of simvastatin.  LDL target less than 70.  Laboratory data is done at the [facility]. ?Secondary prevention discussed. ?He is cleared for upcoming laparoscopic inguinal herniorrhaphy.  Risk should be low. ? ? ?Medication Adjustments/Labs and Tests Ordered: ?Current medicines are reviewed at length with the patient today.  Concerns regarding medicines are outlined above.  ?Orders Placed This Encounter  ?Procedures  ? EKG  12-Lead  ? ?No orders of the defined types were placed in this encounter. ? ? ?Patient Instructions  ?Medication Instructions:  ?Your physician recommends that you continue on your current medications as directed. Please refer to the Current Medication list given to you today. ? ?*If you need a refill on your cardiac medications before your next appointment, please call your pharmacy* ? ?Lab Work: ?NONE ? ?Testing/Procedures: ?NONE ? ?Follow-Up: ?At [facility], you and your health needs are our priority.  As part of our continuing mission to provide you with exceptional heart care, we have created designated Provider Care Teams.  These Care Teams include your primary Cardiologist (physician) and Advanced Practice Providers (APPs -  Physician Assistants and Nurse Practitioners) who all work together to provide you with the care you need, when you need it. ? ?Your next appointment:   ?1 year(s) ? ?The format for your next appointment:   ?In Person ? ?Provider:   ?[name], MD { ? ? ?Important Information About Sugar ? ? ? ? ?   ? ?Signed, ?[name], MD  ?11/25/2021 10:58 AM    ?[place] ?

## 2021-11-25 ENCOUNTER — Encounter: Payer: Self-pay | Admitting: Interventional Cardiology

## 2021-11-25 ENCOUNTER — Ambulatory Visit: Payer: Medicare Other | Admitting: Interventional Cardiology

## 2021-11-25 VITALS — BP 110/62 | HR 63 | Ht 71.0 in | Wt 194.4 lb

## 2021-11-25 DIAGNOSIS — E7849 Other hyperlipidemia: Secondary | ICD-10-CM | POA: Diagnosis not present

## 2021-11-25 DIAGNOSIS — I6529 Occlusion and stenosis of unspecified carotid artery: Secondary | ICD-10-CM | POA: Diagnosis not present

## 2021-11-25 DIAGNOSIS — I25709 Atherosclerosis of coronary artery bypass graft(s), unspecified, with unspecified angina pectoris: Secondary | ICD-10-CM | POA: Diagnosis not present

## 2021-11-25 DIAGNOSIS — Z01818 Encounter for other preprocedural examination: Secondary | ICD-10-CM | POA: Diagnosis not present

## 2021-11-25 NOTE — Patient Instructions (Signed)

## 2022-01-09 ENCOUNTER — Telehealth: Payer: Self-pay | Admitting: *Deleted

## 2022-01-09 ENCOUNTER — Ambulatory Visit: Payer: Self-pay | Admitting: Surgery

## 2022-01-09 NOTE — Telephone Encounter (Signed)
   Pre-operative Risk Assessment    Patient Name: Adrian [name]  DOB: March 09, 1946 MRN: [id number]      Request for Surgical Clearance    Procedure:   HERNIA SURGERY  Date of Surgery:  Clearance TBD                                 Surgeon:  DR. Armandina Gemma Surgeon's Group or Practice Name:  [facility] Phone number:  [phone] Fax number:  [phone] ATTN: Mammie Lorenzo, LPN   Type of Clearance Requested:   - Medical ; ASA   Type of Anesthesia:  General    Additional requests/questions:    Adrian [name]   01/09/2022, 6:05 PM

## 2022-01-10 NOTE — Telephone Encounter (Signed)
   Primary Cardiologist: [name], MD  Chart reviewed as part of pre-operative protocol coverage. Given past medical history and time since last visit, based on ACC/AHA guidelines, Adrian [name] would be at acceptable risk for the planned procedure without further cardiovascular testing.   His aspirin may be held for 5 to 7 days prior to his procedure.  Please resume as soon as hemostasis is achieved.  I will route this recommendation to the requesting party via Epic fax function and remove from pre-op pool.  Please call with questions.  Jossie Ng. Kylie Gros NP-C    01/10/2022, 9:49 AM [place] [address] Office [phone] Fax [phone]

## 2022-01-22 NOTE — Patient Instructions (Signed)
DUE TO COVID-19 ONLY TWO VISITORS  (aged 75 and older)  ARE ALLOWED TO COME WITH YOU AND STAY IN THE WAITING ROOM ONLY DURING PRE OP AND PROCEDURE.   **NO VISITORS ARE ALLOWED IN THE SHORT STAY AREA OR RECOVERY ROOM!!**  IF YOU WILL BE ADMITTED INTO THE HOSPITAL YOU ARE ALLOWED ONLY FOUR SUPPORT PEOPLE DURING VISITATION HOURS ONLY (7 AM -8PM)   The support person(s) must pass our screening, gel in and out, and wear a mask at all times, including in the patient's room. Patients must also wear a mask when staff or their support person are in the room. Visitors GUEST BADGE MUST BE WORN VISIBLY  One adult visitor may remain with you overnight and MUST be in the room by 8 P.M.     Your procedure is scheduled on: 02/06/22   Report to [facility] Main Entrance    Report to admitting at 9:00  AM   Call this number if you have problems the morning of surgery [phone]   Do not eat food :After Midnight.   After Midnight you may have the following liquids until _8:00_AM DAY OF SURGERY  Water Black Coffee (sugar ok, NO MILK/CREAM OR CREAMERS)  Tea (sugar ok, NO MILK/CREAM OR CREAMERS) regular and decaf                             Plain Jell-O (NO RED)                                           Fruit ices (not with fruit pulp, NO RED)                                     Popsicles (NO RED)                                                                  Juice: apple, WHITE grape, WHITE cranberry Sports drinks like Gatorade (NO RED) Clear broth(vegetable,chicken,beef)         If you have questions, please contact your surgeon's office.       Oral Hygiene is also important to reduce your risk of infection.                                    Remember - BRUSH YOUR TEETH THE MORNING OF SURGERY WITH YOUR REGULAR TOOTHPASTE   Do NOT smoke after Midnight   Take these medicines the morning of surgery with A SIP OF WATER: Gabapentin  How to Manage Your Diabetes Before and After  Surgery  Why is it important to control my blood sugar before and after surgery? Improving blood sugar levels before and after surgery helps healing and can limit problems. A way of improving blood sugar control is eating a healthy diet by:  Eating less sugar and carbohydrates  Increasing activity/exercise  Talking with your doctor about reaching your blood sugar goals High blood sugars (greater  than 180 mg/dL) can raise your risk of infections and slow your recovery, so you will need to focus on controlling your diabetes during the weeks before surgery. Make sure that the doctor who takes care of your diabetes knows about your planned surgery including the date and location.  How do I manage my blood sugar before surgery? Check your blood sugar at least 4 times a day, starting 2 days before surgery, to make sure that the level is not too high or low. Check your blood sugar the morning of your surgery when you wake up and every 2 hours until you get to the Short Stay unit. If your blood sugar is less than 70 mg/dL, you will need to treat for low blood sugar: Do not take insulin. Treat a low blood sugar (less than 70 mg/dL) with  cup of clear juice (cranberry or apple), 4 glucose tablets, OR glucose gel. Recheck blood sugar in 15 minutes after treatment (to make sure it is greater than 70 mg/dL). If your blood sugar is not greater than 70 mg/dL on recheck, call [phone] for further instructions. Report your blood sugar to the short stay nurse when you get to Short Stay.  If you are admitted to the hospital after surgery: Your blood sugar will be checked by the staff and you will probably be given insulin after surgery (instead of oral diabetes medicines) to make sure you have good blood sugar levels. The goal for blood sugar control after surgery is 80-180 mg/dL.  WHAT DO I DO ABOUT MY DIABETES MEDICATION?        Do not take your Jardiance the day before surgery  Do not take oral  diabetes medicines (pills) the morning of surgery.   I Bring CPAP mask and tubing day of surgery.                              You may not have any metal on your body including  jewelry, and body piercing             Do not wear lotions, powders, cologne, or deodorant               Men may shave face and neck.   Do not bring valuables to the hospital. [place].   Contacts, dentures or bridgework may not be worn into surgery.     Patients discharged on the day of surgery will not be allowed to drive home.  Someone NEEDS to stay with you for the first 24 hours after anesthesia.   Special Instructions: Bring a copy of your healthcare power of attorney and living will documents   the day of surgery if you haven't scanned them before.              Please read over the following fact sheets you were given: IF YOU HAVE QUESTIONS ABOUT YOUR PRE-OP INSTRUCTIONS PLEASE CALL [phone]     [facility] - Preparing for Surgery Before surgery, you can play an important role.  Because skin is not sterile, your skin needs to be as free of germs as possible.  You can reduce the number of germs on your skin by washing with CHG (chlorahexidine gluconate) soap before surgery.  CHG is an antiseptic cleaner which kills germs and bonds with the skin to continue  killing germs even after washing. Please DO NOT use if you have an allergy to CHG or antibacterial soaps.  If your skin becomes reddened/irritated stop using the CHG and inform your nurse when you arrive at Short Stay.You may shave your face/neck. Please follow these instructions carefully:  1.  Shower with CHG Soap the night before surgery and the  morning of Surgery.  2.  If you choose to wash your hair, wash your hair first as usual with your  normal  shampoo.  3.  After you shampoo, rinse your hair and body thoroughly to remove the  shampoo.                            4.  Use CHG as you would any  other liquid soap.  You can apply chg directly  to the skin and wash                       Gently with a scrungie or clean washcloth.  5.  Apply the CHG Soap to your body ONLY FROM THE NECK DOWN.   Do not use on face/ open                           Wound or open sores. Avoid contact with eyes, ears mouth and genitals (private parts).                       Wash face,  Genitals (private parts) with your normal soap.             6.  Wash thoroughly, paying special attention to the area where your surgery  will be performed.  7.  Thoroughly rinse your body with warm water from the neck down.  8.  DO NOT shower/wash with your normal soap after using and rinsing off  the CHG Soap.                9.  Pat yourself dry with a clean towel.            10.  Wear clean pajamas.            11.  Place clean sheets on your bed the night of your first shower and do not  sleep with pets. Day of Surgery : Do not apply any lotions/deodorants the morning of surgery.  Please wear clean clothes to the hospital/surgery center.  FAILURE TO FOLLOW THESE INSTRUCTIONS MAY RESULT IN THE CANCELLATION OF YOUR SURGERY PATIENT SIGNATURE_________________________________  NURSE SIGNATURE__________________________________  ________________________________________________________________________

## 2022-01-29 ENCOUNTER — Encounter (HOSPITAL_COMMUNITY)
Admission: RE | Admit: 2022-01-29 | Discharge: 2022-01-29 | Disposition: A | Discharge status: Home / Self Care | Payer: No Typology Code available for payment source | Source: Ambulatory Visit | Attending: Surgery | Admitting: Surgery

## 2022-01-29 ENCOUNTER — Other Ambulatory Visit: Payer: Self-pay

## 2022-01-29 ENCOUNTER — Encounter (HOSPITAL_COMMUNITY): Payer: Self-pay

## 2022-01-29 DIAGNOSIS — Z87891 Personal history of nicotine dependence: Secondary | ICD-10-CM | POA: Insufficient documentation

## 2022-01-29 DIAGNOSIS — K4090 Unilateral inguinal hernia, without obstruction or gangrene, not specified as recurrent: Secondary | ICD-10-CM | POA: Insufficient documentation

## 2022-01-29 DIAGNOSIS — I2510 Atherosclerotic heart disease of native coronary artery without angina pectoris: Secondary | ICD-10-CM | POA: Diagnosis not present

## 2022-01-29 DIAGNOSIS — Z951 Presence of aortocoronary bypass graft: Secondary | ICD-10-CM | POA: Insufficient documentation

## 2022-01-29 DIAGNOSIS — Z01812 Encounter for preprocedural laboratory examination: Secondary | ICD-10-CM | POA: Diagnosis present

## 2022-01-29 DIAGNOSIS — E119 Type 2 diabetes mellitus without complications: Secondary | ICD-10-CM | POA: Diagnosis not present

## 2022-01-29 HISTORY — DX: Dorsalgia, unspecified: M54.9

## 2022-01-29 HISTORY — DX: Tremor, unspecified: R25.1

## 2022-01-29 HISTORY — DX: Unspecified osteoarthritis, unspecified site: M19.90

## 2022-01-29 HISTORY — DX: Myoneural disorder, unspecified: G70.9

## 2022-01-29 HISTORY — DX: Plantar fascial fibromatosis: M72.2

## 2022-01-29 HISTORY — DX: Type 2 diabetes mellitus without complications: E11.9

## 2022-01-29 LAB — HEMOGLOBIN A1C
Hgb A1c MFr Bld: 5.6 % (ref 4.8–5.6)
Mean Plasma Glucose: 114.02 mg/dL

## 2022-01-29 LAB — BASIC METABOLIC PANEL
Anion gap: 7 (ref 5–15)
BUN: 15 mg/dL (ref 8–23)
CO2: 24 mmol/L (ref 22–32)
Calcium: 9.9 mg/dL (ref 8.9–10.3)
Chloride: 110 mmol/L (ref 98–111)
Creatinine, Ser: 1.12 mg/dL (ref 0.61–1.24)
GFR, Estimated: >60 mL/min (ref >60)
Glucose, Bld: 130 mg/dL — ABNORMAL HIGH (ref 70–99)
Potassium: 4.4 mmol/L (ref 3.5–5.1)
Sodium: 141 mmol/L (ref 135–145)

## 2022-01-29 LAB — CBC
HCT: 49 % (ref 39.0–52.0)
Hemoglobin: 16.6 g/dL (ref 13.0–17.0)
MCH: 30.2 pg (ref 26.0–34.0)
MCHC: 33.9 g/dL (ref 30.0–36.0)
MCV: 89.3 fL (ref 80.0–100.0)
Platelets: 152 10*3/uL (ref 150–400)
RBC: 5.49 MIL/uL (ref 4.22–5.81)
RDW: 13.4 % (ref 11.5–15.5)
WBC: 10.6 10*3/uL — ABNORMAL HIGH (ref 4.0–10.5)
nRBC: 0 % (ref 0.0–0.2)

## 2022-01-29 LAB — GLUCOSE, CAPILLARY: Glucose-Capillary: 123 mg/dL — ABNORMAL HIGH (ref 70–99)

## 2022-01-29 NOTE — Progress Notes (Signed)
Anesthesia note:  Bowel prep reminder:NA  PCP - Dr. Dora Sims NP Cardiologist -Dr. Linard Millers Other-   Chest x-ray - no EKG - 11/25/21-epic Stress Test - 2015 ECHO - 02/20/17-epic Cardiac Cath - 2015, CABG 2015  Pacemaker/ICD device last checked:NA  Sleep Study - no CPAP -    Fasting Blood Sugar - 111-124 Checks Blood Sugar __2 times a week___  Blood Thinner:ASA 81 mg/ Dr. Linard Millers Blood Thinner Instructions: Aspirin Instructions:none. Pt will call Dr. [name] to ask Last Dose:  Anesthesia review: yes  Patient denies shortness of breath, fever, cough and chest pain at PAT appointment Pt reports no SOB with activities. He has chronic back pain and plantar fasciitis.  Patient verbalized understanding of instructions that were given to them at the PAT appointment. Patient was also instructed that they will need to review over the PAT instructions again at home before surgery. yes

## 2022-01-30 NOTE — Progress Notes (Signed)
Anesthesia Chart Review   Case: [id number] Date/Time: 02/06/22 1045   Procedure: OPEN REPAIR RIGHT INGUINAL HERNIA WITH MESH (Right)   Anesthesia type: General   Pre-op diagnosis: right inguinal hernia   Location: [facility] ROOM 01 / [facility]   Surgeons: Armandina Gemma, MD       DISCUSSION:75 y.o. former smoker with h/o DM II, CAD (CABG), right inguinal hernia scheduled for above procedure 02/06/2022 with Dr. Armandina Gemma.   Per cardiology preoperative evaluation 01/10/2022, "Chart reviewed as part of pre-operative protocol coverage. Given past medical history and time since last visit, based on ACC/AHA guidelines, Adrian [name] would be at acceptable risk for the planned procedure without further cardiovascular testing.    His aspirin may be held for 5 to 7 days prior to his procedure.  Please resume as soon as hemostasis is achieved."  Anticipate pt can proceed with planned procedure barring acute status change.   VS: BP 132/77   Pulse (!) 51   Temp 36.7 C (Oral)   Resp 18   Ht '5\' 11"'$  (1.803 m)   Wt 88 kg   SpO2 99%   BMI 27.06 kg/m   PROVIDERS: Mackey Birchwood, NP is PCP   [name], MD is Cardiologist  LABS: Labs reviewed: Acceptable for surgery. (all labs ordered are listed, but only abnormal results are displayed)  Labs Reviewed  BASIC METABOLIC PANEL - Abnormal; Notable for the following components:      Result Value   Glucose, Bld 130 (*)    All other components within normal limits  CBC - Abnormal; Notable for the following components:   WBC 10.6 (*)    All other components within normal limits  GLUCOSE, CAPILLARY - Abnormal; Notable for the following components:   Glucose-Capillary 123 (*)    All other components within normal limits  HEMOGLOBIN A1C     IMAGES:   EKG: 11/25/21 Rate 63 bp m NSR  CV: Echo 02/20/2017 - Left ventricle: The cavity size was normal. Systolic function was    normal. The estimated ejection fraction was in the range of 60%    to  65%. Wall motion was normal; there were no regional wall    motion abnormalities. Left ventricular diastolic function    parameters were normal.  - Aortic valve: Poorly visualized.  Past Medical History:  Diagnosis Date   Arthritis    back and knees   Back pain due to injury    broken back in 1960s.   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    Past Surgical History:  Procedure Laterality Date   CORONARY ARTERY BYPASS GRAFT N/A 08/26/2013   Procedure: Coronary Artery Bypass Graft times four using left internal mammary artery and right leg greater saphenous vein harvested endoscopically;  Surgeon: [name], MD;  Location: [facility];  Service: Open Heart Surgery;  Laterality: N/A;   INTRAOPERATIVE TRANSESOPHAGEAL ECHOCARDIOGRAM N/A 08/26/2013   Procedure: INTRAOPERATIVE TRANSESOPHAGEAL ECHOCARDIOGRAM;  Surgeon: [name], MD;  Location: [facility];  Service: Open Heart Surgery;  Laterality: N/A;   KNEE SURGERY Left 2015  total   KNEE SURGERY Right 1964   left 1968   LEFT HEART CATHETERIZATION WITH CORONARY ANGIOGRAM N/A 08/24/2013   Procedure: LEFT HEART CATHETERIZATION WITH CORONARY ANGIOGRAM;  Surgeon: [name], MD;  Location: [facility] CATH LAB;  Service: Cardiovascular;  Laterality: N/A;   TRANSURETHRAL RESECTION OF PROSTATE     age 43    MEDICATIONS:  allopurinol (ZYLOPRIM) 300 MG tablet   amoxicillin (AMOXIL) 500 MG capsule   aspirin EC 81 MG tablet   cholecalciferol (VITAMIN D3) 25 MCG (1000 UNIT) tablet   empagliflozin (JARDIANCE) 25 MG TABS tablet   gabapentin (NEURONTIN) 300 MG capsule   HYDROmorphone (DILAUDID) 2 MG tablet   metFORMIN  (GLUCOPHAGE-XR) 500 MG 24 hr tablet   simvastatin (ZOCOR) 20 MG tablet   No current facility-administered medications for this encounter.     Konrad Felix Ward, PA-C [facility] Pre-Surgical Testing [phone]

## 2022-02-04 ENCOUNTER — Encounter (HOSPITAL_COMMUNITY): Payer: Self-pay | Admitting: Surgery

## 2022-02-04 DIAGNOSIS — K4090 Unilateral inguinal hernia, without obstruction or gangrene, not specified as recurrent: Secondary | ICD-10-CM

## 2022-02-04 NOTE — H&P (Signed)
PROVIDER: Betania Dizon Charlotta Newton, MD   Chief Complaint: New Consultation (Right inguinal hernia)  History of Present Illness:  Patient is referred by Dr. Landry Mellow at the [facility] for surgical evaluation and management of right inguinal hernia. Patient had had an small longstanding right inguinal hernia which has been asymptomatic. A few months ago, the patient was doing some heavy lifting assisting a neighbor. He noted an increase in the size of his hernia and developed discomfort. Hernia has remained reducible but does cause intermittent pain. He has had no obstructive symptoms. Patient has had no prior abdominal surgery. He has had no prior hernia repairs. He now presents for evaluation for right inguinal hernia repair.  Patient has a significant past medical history including coronary artery disease with a history of bypass surgery in 2015. He is followed by Dr. Pernell Dupre. Patient also has diabetes. Patient is on chronic narcotics for back pain. He is accompanied today by his wife who is a former [employer] scientist in [employer].  Review of Systems: A complete review of systems was obtained from the patient. I have reviewed this information and discussed as appropriate with the patient. See HPI as well for other ROS.  Review of Systems  Constitutional: Negative.  HENT: Negative.  Eyes: Negative.  Respiratory: Negative.  Cardiovascular: Negative.  Gastrointestinal: Negative.  Genitourinary:  Right inguinal bulge  Musculoskeletal: Positive for back pain and joint pain.  Skin: Negative.  Neurological: Negative.  Endo/Heme/Allergies: Negative.  Psychiatric/Behavioral: Negative.   Medical History: Past Medical History:  Diagnosis Date  Arthritis  Diabetes mellitus without complication (CMS-HCC)  Hyperlipidemia   Patient Active Problem List  Diagnosis  Right inguinal hernia   Past Surgical History:  Procedure Laterality Date  quad bypass 2015     Allergies  Allergen Reactions  Codeine Anxiety and Other (See Comments)  Makes him feel jittery and wired Other reaction(s): Delirium   Current Outpatient Medications on File Prior to Visit  Medication Sig Dispense Refill  allopurinoL (ZYLOPRIM) 300 MG tablet allopurinol 300 mg tablet TAKE 1 TABLET BY MOUTH EVERY DAY  empagliflozin (JARDIANCE) 25 mg tablet Take by mouth  metFORMIN (GLUCOPHAGE-XR) 500 MG XR tablet Take 500 mg by mouth 2 (two) times daily  simvastatin (ZOCOR) 20 MG tablet simvastatin 20 mg tablet  acetaminophen (TYLENOL) 500 MG tablet Take 1,000 mg by mouth every 6 (six) hours as needed  gabapentin (NEURONTIN) 300 MG capsule Take 300 mg by mouth 4 (four) times daily   No current facility-administered medications on file prior to visit.   Family History  Problem Relation Age of Onset  Skin cancer Father  Coronary Artery Disease (Blocked arteries around heart) Father  Obesity Brother    Social History   Tobacco Use  Smoking Status Former  Types: Cigarettes  Quit date: 2014  Years since quitting: 9.4  Smokeless Tobacco Never    Social History   Socioeconomic History  Marital status: Married  Tobacco Use  Smoking status: Former  Types: Cigarettes  Quit date: 2014  Years since quitting: 9.4  Smokeless tobacco: Never  Substance and Sexual Activity  Alcohol use: Yes  Drug use: Never    Vitals:  BP: 114/72  Pulse: 74  Temp: 36.6 C (97.8 F)  SpO2: 98%  Weight: 88.2 kg (194 lb 6.4 oz)  Height: 180.3 cm ('5\' 11"'$ )   Body mass index is 27.11 kg/m.  Physical Exam   GENERAL APPEARANCE Comfortable, no acute issues Development: normal  Gross deformities: none  SKIN Rash, lesions, ulcers: none Induration, erythema: none Nodules: none palpable  EYES Conjunctiva and lids: normal Pupils: equal and reactive  EARS, NOSE, MOUTH, THROAT External ears: no lesion or deformity External nose: no lesion or deformity Hearing: grossly  normal  NECK Symmetric: yes Trachea: midline Thyroid: no palpable nodules in the thyroid bed  CHEST Respiratory effort: normal Retraction or accessory muscle use: no Breath sounds: normal bilaterally Rales, rhonchi, wheeze: none  CARDIOVASCULAR Auscultation: regular rhythm, normal rate Murmurs: none Pulses: radial pulse 2+ palpable Lower extremity edema: none  ABDOMEN Soft without distention. No sign of umbilical hernia. No surgical incisions.  GENITOURINARY Normal male genitalia without mass or lesion. Palpation in the left inguinal canal with cough and Valsalva shows no sign of hernia. There is a visible bulge on the right. This is reducible with manipulation. There is minor discomfort. With cough and Valsalva of the bulge augments consistent with right inguinal hernia.  MUSCULOSKELETAL Station and gait: normal Digits and nails: no clubbing or cyanosis Muscle strength: grossly normal all extremities Range of motion: grossly normal all extremities Deformity: none  LYMPHATIC Cervical: none palpable Supraclavicular: none palpable  PSYCHIATRIC Oriented to person, place, and time: yes Mood and affect: normal for situation Judgment and insight: appropriate for situation   Assessment and Plan:   Right inguinal hernia  Patient presents today with right inguinal hernia, mildly symptomatic, reducible, for discussion of operative repair. Patient is provided with written literature on hernia surgery to review at home.  We discussed the options for hernia repair including open hernia surgery with mesh, laparoscopic repair, and robotic repair. My recommendation for first time one-sided hernia repair remains open inguinal hernia repair with mesh since this has the lowest recurrence rate. We discussed the procedure in detail and I provided them with written literature to review at home. We discussed restrictions on his activities after the procedure. We discussed the risk of  recurrence. They understand and wish to proceed with surgery in the near future. We will make arrangements for the procedure at [facility] at a time convenient for the patient. We will plan outpatient surgery.  Armandina Gemma, MD [facility] A [place] practice Office: [phone]

## 2022-02-06 ENCOUNTER — Ambulatory Visit (HOSPITAL_COMMUNITY)
Admission: RE | Admit: 2022-02-06 | Discharge: 2022-02-06 | Disposition: A | Discharge status: Home / Self Care | Payer: No Typology Code available for payment source | Source: Ambulatory Visit | Attending: Surgery | Admitting: Surgery

## 2022-02-06 ENCOUNTER — Encounter (HOSPITAL_COMMUNITY)
Admission: RE | Disposition: A | Discharge status: Home / Self Care | Payer: Self-pay | Source: Ambulatory Visit | Attending: Surgery

## 2022-02-06 ENCOUNTER — Encounter (HOSPITAL_COMMUNITY): Payer: Self-pay | Admitting: Surgery

## 2022-02-06 ENCOUNTER — Ambulatory Visit (HOSPITAL_BASED_OUTPATIENT_CLINIC_OR_DEPARTMENT_OTHER): Payer: No Typology Code available for payment source | Admitting: Anesthesiology

## 2022-02-06 ENCOUNTER — Ambulatory Visit (HOSPITAL_COMMUNITY): Payer: No Typology Code available for payment source | Admitting: Physician Assistant

## 2022-02-06 DIAGNOSIS — E119 Type 2 diabetes mellitus without complications: Secondary | ICD-10-CM

## 2022-02-06 DIAGNOSIS — K4090 Unilateral inguinal hernia, without obstruction or gangrene, not specified as recurrent: Secondary | ICD-10-CM

## 2022-02-06 DIAGNOSIS — I2510 Atherosclerotic heart disease of native coronary artery without angina pectoris: Secondary | ICD-10-CM | POA: Diagnosis not present

## 2022-02-06 DIAGNOSIS — Z87891 Personal history of nicotine dependence: Secondary | ICD-10-CM

## 2022-02-06 DIAGNOSIS — Z7984 Long term (current) use of oral hypoglycemic drugs: Secondary | ICD-10-CM

## 2022-02-06 DIAGNOSIS — I252 Old myocardial infarction: Secondary | ICD-10-CM | POA: Insufficient documentation

## 2022-02-06 DIAGNOSIS — Z951 Presence of aortocoronary bypass graft: Secondary | ICD-10-CM | POA: Diagnosis not present

## 2022-02-06 DIAGNOSIS — M1990 Unspecified osteoarthritis, unspecified site: Secondary | ICD-10-CM | POA: Insufficient documentation

## 2022-02-06 DIAGNOSIS — G709 Myoneural disorder, unspecified: Secondary | ICD-10-CM | POA: Diagnosis not present

## 2022-02-06 HISTORY — PX: INGUINAL HERNIA REPAIR: SHX194

## 2022-02-06 LAB — GLUCOSE, CAPILLARY
Glucose-Capillary: 129 mg/dL — ABNORMAL HIGH (ref 70–99)
Glucose-Capillary: 129 mg/dL — ABNORMAL HIGH (ref 70–99)

## 2022-02-06 SURGERY — REPAIR, HERNIA, INGUINAL, ADULT
Anesthesia: General | Laterality: Right

## 2022-02-06 MED ORDER — PROPOFOL 10 MG/ML IV BOLUS
INTRAVENOUS | Status: DC | PRN
  Administered 2022-02-06: 150 mg via INTRAVENOUS

## 2022-02-06 MED ORDER — CHLORHEXIDINE GLUCONATE CLOTH 2 % EX PADS: 6.0000 | MEDICATED_PAD | Freq: Once | CUTANEOUS | Status: DC

## 2022-02-06 MED ORDER — LACTATED RINGERS IV SOLN: INTRAVENOUS | Status: DC

## 2022-02-06 MED ORDER — TRAMADOL HCL 50 MG PO TABS: 50.0000 mg | ORAL_TABLET | Freq: Four times a day (QID) | ORAL | 0 refills | Status: DC | PRN

## 2022-02-06 MED ORDER — EPHEDRINE 5 MG/ML INJ
INTRAVENOUS | Status: AC
  Filled 2022-02-06: qty 5

## 2022-02-06 MED ORDER — DEXAMETHASONE SODIUM PHOSPHATE 10 MG/ML IJ SOLN
INTRAMUSCULAR | Status: DC | PRN
  Administered 2022-02-06: 4 mg via INTRAVENOUS

## 2022-02-06 MED ORDER — 0.9 % SODIUM CHLORIDE (POUR BTL) OPTIME
TOPICAL | Status: DC | PRN
  Administered 2022-02-06: 1000 mL

## 2022-02-06 MED ORDER — BUPIVACAINE LIPOSOME 1.3 % IJ SUSP: 20.0000 mL | Freq: Once | INTRAMUSCULAR | Status: DC

## 2022-02-06 MED ORDER — FENTANYL CITRATE PF 50 MCG/ML IJ SOSY
25.0000 ug | PREFILLED_SYRINGE | INTRAMUSCULAR | Status: DC | PRN
  Administered 2022-02-06: 50 ug via INTRAVENOUS

## 2022-02-06 MED ORDER — FENTANYL CITRATE (PF) 100 MCG/2ML IJ SOLN
INTRAMUSCULAR | Status: DC | PRN
  Administered 2022-02-06: 100 ug via INTRAVENOUS
  Administered 2022-02-06 (×3): 50 ug via INTRAVENOUS

## 2022-02-06 MED ORDER — EPHEDRINE SULFATE-NACL 50-0.9 MG/10ML-% IV SOSY
PREFILLED_SYRINGE | INTRAVENOUS | Status: DC | PRN
  Administered 2022-02-06: 5 mg via INTRAVENOUS

## 2022-02-06 MED ORDER — MIDAZOLAM HCL 2 MG/2ML IJ SOLN
INTRAMUSCULAR | Status: AC
  Filled 2022-02-06: qty 2

## 2022-02-06 MED ORDER — CHLORHEXIDINE GLUCONATE 0.12 % MT SOLN
15.0000 mL | Freq: Once | OROMUCOSAL | Status: AC
Start: 2022-02-06 — End: 2022-02-06
  Administered 2022-02-06: 15 mL via OROMUCOSAL

## 2022-02-06 MED ORDER — ROCURONIUM BROMIDE 10 MG/ML (PF) SYRINGE
PREFILLED_SYRINGE | INTRAVENOUS | Status: DC | PRN
  Administered 2022-02-06: 10 mg via INTRAVENOUS
  Administered 2022-02-06: 60 mg via INTRAVENOUS

## 2022-02-06 MED ORDER — CEFAZOLIN SODIUM-DEXTROSE 2-4 GM/100ML-% IV SOLN
2.0000 g | INTRAVENOUS | Status: AC
  Administered 2022-02-06: 2 g via INTRAVENOUS
  Filled 2022-02-06: qty 100

## 2022-02-06 MED ORDER — ONDANSETRON HCL 4 MG/2ML IJ SOLN
INTRAMUSCULAR | Status: DC | PRN
  Administered 2022-02-06: 4 mg via INTRAVENOUS

## 2022-02-06 MED ORDER — SUGAMMADEX SODIUM 200 MG/2ML IV SOLN
INTRAVENOUS | Status: DC | PRN
  Administered 2022-02-06: 200 mg via INTRAVENOUS

## 2022-02-06 MED ORDER — BUPIVACAINE HCL (PF) 0.5 % IJ SOLN
INTRAMUSCULAR | Status: AC
  Filled 2022-02-06: qty 30

## 2022-02-06 MED ORDER — LIDOCAINE 2% (20 MG/ML) 5 ML SYRINGE
INTRAMUSCULAR | Status: DC | PRN
  Administered 2022-02-06: 80 mg via INTRAVENOUS

## 2022-02-06 MED ORDER — ONDANSETRON HCL 4 MG/2ML IJ SOLN
4.0000 mg | Freq: Once | INTRAMUSCULAR | Status: DC | PRN
Start: 2022-02-06 — End: 2022-02-06

## 2022-02-06 MED ORDER — ORAL CARE MOUTH RINSE
15.0000 mL | Freq: Once | OROMUCOSAL | Status: AC
Start: 2022-02-06 — End: 2022-02-06

## 2022-02-06 MED ORDER — BUPIVACAINE LIPOSOME 1.3 % IJ SUSP
INTRAMUSCULAR | Status: AC
  Filled 2022-02-06: qty 20

## 2022-02-06 MED ORDER — ACETAMINOPHEN 500 MG PO TABS
1000.0000 mg | ORAL_TABLET | Freq: Once | ORAL | Status: AC
  Administered 2022-02-06: 1000 mg via ORAL
  Filled 2022-02-06: qty 2

## 2022-02-06 MED ORDER — MIDAZOLAM HCL 5 MG/5ML IJ SOLN
INTRAMUSCULAR | Status: DC | PRN
  Administered 2022-02-06: 1 mg via INTRAVENOUS

## 2022-02-06 MED ORDER — BUPIVACAINE HCL (PF) 0.5 % IJ SOLN
INTRAMUSCULAR | Status: DC | PRN
  Administered 2022-02-06: 20 mL

## 2022-02-06 MED ORDER — BUPIVACAINE LIPOSOME 1.3 % IJ SUSP
INTRAMUSCULAR | Status: DC | PRN
  Administered 2022-02-06: 20 mL

## 2022-02-06 MED ORDER — FENTANYL CITRATE (PF) 250 MCG/5ML IJ SOLN
INTRAMUSCULAR | Status: AC
  Filled 2022-02-06: qty 5

## 2022-02-06 MED ORDER — FENTANYL CITRATE PF 50 MCG/ML IJ SOSY
PREFILLED_SYRINGE | INTRAMUSCULAR | Status: AC
  Filled 2022-02-06: qty 1

## 2022-02-06 SURGICAL SUPPLY — 43 items
ADH SKN CLS APL DERMABOND .7 (GAUZE/BANDAGES/DRESSINGS) ×1
APL PRP STRL LF DISP 70% ISPRP (MISCELLANEOUS) ×2
BAG COUNTER SPONGE SURGICOUNT (BAG) ×3 IMPLANT
BAG SPNG CNTER NS LX DISP (BAG) ×1
BLADE SURG 15 STRL LF DISP TIS (BLADE) ×2 IMPLANT
BLADE SURG 15 STRL SS (BLADE) ×2
BLADE SURG SZ10 CARB STEEL (BLADE) ×1 IMPLANT
CHLORAPREP W/TINT 26 (MISCELLANEOUS) ×6 IMPLANT
COVER SURGICAL LIGHT HANDLE (MISCELLANEOUS) ×3 IMPLANT
DERMABOND ADVANCED (GAUZE/BANDAGES/DRESSINGS) ×1
DERMABOND ADVANCED .7 DNX12 (GAUZE/BANDAGES/DRESSINGS) IMPLANT
DRAIN PENROSE 0.5X18 (DRAIN) ×3 IMPLANT
DRAPE LAPAROTOMY TRNSV 102X78 (DRAPES) ×3 IMPLANT
DRAPE UTILITY XL STRL (DRAPES) ×3 IMPLANT
ELECT REM PT RETURN 15FT ADLT (MISCELLANEOUS) ×3 IMPLANT
GLOVE BIOGEL PI IND STRL 7.0 (GLOVE) ×2 IMPLANT
GLOVE BIOGEL PI INDICATOR 7.0 (GLOVE) ×1
GLOVE SURG ORTHO 8.0 STRL STRW (GLOVE) ×3 IMPLANT
GLOVE SURG SYN 7.5  E (GLOVE) ×2
GLOVE SURG SYN 7.5 E (GLOVE) ×1 IMPLANT
GLOVE SURG SYN 7.5 PF PI (GLOVE) ×2 IMPLANT
GOWN STRL REUS W/ TWL XL LVL3 (GOWN DISPOSABLE) ×4 IMPLANT
GOWN STRL REUS W/TWL XL LVL3 (GOWN DISPOSABLE) ×4
KIT BASIN OR (CUSTOM PROCEDURE TRAY) ×3 IMPLANT
KIT TURNOVER KIT A (KITS) IMPLANT
MARKER SKIN DUAL TIP RULER LAB (MISCELLANEOUS) ×3 IMPLANT
MESH ULTRAPRO 3X6 7.6X15CM (Mesh General) ×1 IMPLANT
NDL HYPO 25X1 1.5 SAFETY (NEEDLE) ×2 IMPLANT
NEEDLE HYPO 25X1 1.5 SAFETY (NEEDLE) ×2 IMPLANT
NS IRRIG 1000ML POUR BTL (IV SOLUTION) ×3 IMPLANT
PACK BASIC VI WITH GOWN DISP (CUSTOM PROCEDURE TRAY) ×3 IMPLANT
PENCIL SMOKE EVACUATOR (MISCELLANEOUS) ×1 IMPLANT
SPIKE FLUID TRANSFER (MISCELLANEOUS) ×3 IMPLANT
SPONGE T-LAP 4X18 ~~LOC~~+RFID (SPONGE) ×7 IMPLANT
STRIP CLOSURE SKIN 1/2X4 (GAUZE/BANDAGES/DRESSINGS) ×3 IMPLANT
SUT MNCRL AB 4-0 PS2 18 (SUTURE) ×3 IMPLANT
SUT NOVA NAB GS-21 0 18 T12 DT (SUTURE) ×1 IMPLANT
SUT NOVA NAB GS-22 2 0 T19 (SUTURE) ×6 IMPLANT
SUT SILK 2 0 SH (SUTURE) ×3 IMPLANT
SUT VIC AB 3-0 SH 18 (SUTURE) ×3 IMPLANT
SYR BULB IRRIG 60ML STRL (SYRINGE) ×3 IMPLANT
SYR CONTROL 10ML LL (SYRINGE) ×3 IMPLANT
TOWEL OR 17X26 10 PK STRL BLUE (TOWEL DISPOSABLE) ×3 IMPLANT

## 2022-02-06 NOTE — Op Note (Addendum)
Procedure Note  Pre-operative Diagnosis:  Right inguinal hernia  Post-operative Diagnosis: same  Procedure:  Open right inguinal hernia repair with mesh  Surgeon:  Armandina Gemma, MD  Assistant:  Clerance Lav, MD ([facility] Resident)  Anesthesia:  General  Preparation:  Chlora-prep  Estimated Blood Loss: minimal  Complications:  none  Indications: The patient presented with a right, reducible hernia.  Patient is referred by Dr. Landry Mellow at the [facility] for surgical evaluation and management of right inguinal hernia. Patient had had an small longstanding right inguinal hernia which has been asymptomatic. A few months ago, the patient was doing some heavy lifting assisting a neighbor. He noted an increase in the size of his hernia and developed discomfort. Hernia has remained reducible but does cause intermittent pain. He has had no obstructive symptoms. Patient has had no prior abdominal surgery. He has had no prior hernia repairs. He now presents for evaluation for right inguinal hernia repair.  I was personally present during the key and critical portions of this procedure and immediately available throughout the entire procedure, as documented in my operative note.   Procedure Details  The patient was evaluated in the holding area. All of the patient's questions were answered and the proposed procedure was confirmed. The site of the procedure was properly marked. The patient was taken to the Operating Room, identified by name, and the procedure verified as inguinal hernia repair.  The patient was placed in the supine position and underwent induction of anesthesia. A "Time Out" was performed per routine. The lower abdomen and groin were prepped and draped in the usual aseptic fashion.  After ascertaining that an adequate level of anesthesia had been obtained, an incision was made in the groin with a #10 blade.  Dissection was carried through the subcutaneous  tissues and hemostasis obtained with the electrocautery.  A Gelpi retractor was placed for exposure.  The external oblique fascia was incised in line with it's fibers and extended through the external inguinal ring.  The cord structures were dissected out of the inguinal canal and encircled with a Penrose drain.  The floor of the inguinal canal was dissected out.  There was a moderately large direct defect which was dissected away from the cord structures and reduced.  The defect was closed with interrupted 0-Novofil sutures.  The cord was explored and there was no sign of an indirect sac.  The floor of the inguinal canal was reconstructed with Ethicon Ultrapro mesh cut to the appropriate dimensions.  It was secured to the pubic tubercle with a 2-0 Novafil suture and along the inguinal ligament with a running 2-0 Novafil suture.  Mesh was split to accommodate the cord structures.  The superior margin of the mesh was secured to the transversalis and internal oblique musculature with interrupted 2-0 Novafil sutures.  The tails of the mesh were overlapped lateral to the cord structures and secured to the inguinal ligament with interrupted 2-0 Novafil sutures to recreate the internal inguinal ring.  Cord structures were returned to the inguinal canal.  Local anesthetic using a mixture of Exparel and Marcaine was infiltrated throughout the field.  External oblique fascia was closed with interrupted 3-0 Vicryl sutures.  Subcutaneous tissues were closed with interrupted 3-0 Vicryl sutures.  Skin was anesthetized with local anesthetic using a mixture of Exparel and Marcaine, and the skin edges were re-approximated with a running 4-0 Monocryl suture.  Wound was washed and dried and Dermabond was applied.  Instrument, sponge,  and needle counts were correct prior to closure and at the conclusion of the case.  The patient tolerated the procedure well.  The patient was awakened from anesthesia and brought to the recovery  room in stable condition.  Armandina Gemma, [place] [facility] Office: [phone]

## 2022-02-06 NOTE — Interval H&P Note (Signed)
History and Physical Interval Note:  02/06/2022 10:45 AM  Adrian [name]  has presented today for surgery, with the diagnosis of right inguinal hernia.  The various methods of treatment have been discussed with the patient and family. After consideration of risks, benefits and other options for treatment, the patient has consented to    Procedure(s): OPEN REPAIR RIGHT INGUINAL HERNIA WITH MESH (Right) as a surgical intervention.    The patient's history has been reviewed, patient examined, no change in status, stable for surgery.  I have reviewed the patient's chart and labs.  Questions were answered to the patient's satisfaction.    Armandina Gemma, [facility] [facility] A [place] practice Office: [place]

## 2022-02-06 NOTE — Anesthesia Procedure Notes (Signed)
Procedure Name: Intubation Date/Time: 02/06/2022 11:26 AM  Performed by: Lavina Hamman, CRNAPre-anesthesia Checklist: Patient identified, Emergency Drugs available, Suction available, Patient being monitored and Timeout performed Patient Re-evaluated:Patient Re-evaluated prior to induction Oxygen Delivery Method: Circle system utilized Preoxygenation: Pre-oxygenation with 100% oxygen Induction Type: IV induction Ventilation: Mask ventilation without difficulty Laryngoscope Size: Mac and 4 Grade View: Grade I Tube type: Oral Tube size: 7.5 mm Number of attempts: 1 Airway Equipment and Method: Stylet Placement Confirmation: ETT inserted through vocal cords under direct vision, positive ETCO2, CO2 detector and breath sounds checked- equal and bilateral Secured at: 23 cm Tube secured with: Tape Dental Injury: Teeth and Oropharynx as per pre-operative assessment  Comments: ATOI, vocal cords appear normal.  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

## 2022-02-06 NOTE — Anesthesia Preprocedure Evaluation (Addendum)
Anesthesia Evaluation  Patient identified by MRN, date of birth, ID band Patient awake    Reviewed: Allergy & Precautions, NPO status , Patient's Chart, lab work & pertinent test results  Airway Mallampati: III  TM Distance: >3 FB Neck ROM: Full    Dental  (+) Dental Advisory Given, Missing,    Pulmonary former smoker,    Pulmonary exam normal breath sounds clear to auscultation       Cardiovascular (-) angina+ CAD, + Past MI and + CABG  Normal cardiovascular exam Rhythm:Regular Rate:Normal     Neuro/Psych  Neuromuscular disease negative psych ROS   GI/Hepatic Neg liver ROS, RIH   Endo/Other  diabetes, Type 2, Oral Hypoglycemic Agents  Renal/GU negative Renal ROS     Musculoskeletal  (+) Arthritis ,   Abdominal   Peds  Hematology negative hematology ROS (+)   Anesthesia Other Findings Day of surgery medications reviewed with the patient.  Reproductive/Obstetrics                            Anesthesia Physical Anesthesia Plan  ASA: 3  Anesthesia Plan: General   Post-op Pain Management: Tylenol PO (pre-op)*   Induction: Intravenous  PONV Risk Score and Plan: 3 and Dexamethasone and Ondansetron  Airway Management Planned: Oral ETT  Additional Equipment:   Intra-op Plan:   Post-operative Plan: Extubation in OR  Informed Consent: I have reviewed the patients History and Physical, chart, labs and discussed the procedure including the risks, benefits and alternatives for the proposed anesthesia with the patient or authorized representative who has indicated his/her understanding and acceptance.     Dental advisory given  Plan Discussed with: CRNA  Anesthesia Plan Comments:         Anesthesia Quick Evaluation

## 2022-02-06 NOTE — Transfer of Care (Signed)
Immediate Anesthesia Transfer of Care Note  Patient: Adrian [name]  Procedure(s) Performed: Procedure(s): OPEN REPAIR RIGHT INGUINAL HERNIA WITH MESH (Right)  Patient Location: PACU  Anesthesia Type:General  Level of Consciousness:  sedated, patient cooperative and responds to stimulation  Airway & Oxygen Therapy:Patient Spontanous Breathing and Patient connected to face mask oxgen  Post-op Assessment:  Report given to PACU RN and Post -op Vital signs reviewed and stable  Post vital signs:  Reviewed and stable  Last Vitals:  Vitals:   02/06/22 0934  BP: 139/81  Pulse: (!) 58  Resp: 18  Temp: 36.8 C  SpO2: 85%    Complications: No apparent anesthesia complications

## 2022-02-06 NOTE — Anesthesia Postprocedure Evaluation (Signed)
Anesthesia Post Note  Patient: Adrian [name]  Procedure(s) Performed: OPEN REPAIR RIGHT INGUINAL HERNIA WITH MESH (Right)     Patient location during evaluation: PACU Anesthesia Type: General Level of consciousness: awake and alert Pain management: pain level controlled Vital Signs Assessment: post-procedure vital signs reviewed and stable Respiratory status: spontaneous breathing, nonlabored ventilation, respiratory function stable and patient connected to nasal cannula oxygen Cardiovascular status: blood pressure returned to baseline and stable Postop Assessment: no apparent nausea or vomiting Anesthetic complications: no   No notable events documented.  Last Vitals:  Vitals:   02/06/22 1336 02/06/22 1345  BP: (!) 145/79 (!) 138/94  Pulse: (!) 52 (!) 54  Resp: 14   Temp:    SpO2: 95% 94%    Last Pain:  Vitals:   02/06/22 1336  TempSrc:   PainSc: [name]

## 2022-02-06 NOTE — Discharge Instructions (Addendum)
[facility]  HERNIA REPAIR POST OP INSTRUCTIONS  Always review your discharge instruction sheet given to you by the facility where your surgery was performed.  A  prescription for pain medication may be sent to your pharmacy on discharge.  Take your pain medication as prescribed.  If narcotic pain medicine is not needed, then you may take acetaminophen (Tylenol) or ibuprofen (Advil) as needed.  Take your usually prescribed medications unless otherwise directed.  If you need a refill on your pain medication, please contact your pharmacy.  They will contact our office to request authorization. Prescriptions will not be filled after 5:00 PM daily or on weekends.  You should follow a light diet the first 24 hours after arrival home, such as soup and crackers or toast.  Be sure to include plenty of fluids daily.  Resume your normal diet the day after surgery.  Most patients will experience some swelling and bruising around the surgical site.  Ice packs and reclining will help.  Swelling and bruising can take several days to resolve.   It is common to experience some constipation if taking pain medication after surgery.  Increasing fluid intake and taking a stool softener (such as Colace) will usually help or prevent this problem from occurring.  A mild laxative (Milk of Magnesia or Miralax) should be taken according to package directions if there is no bowel movement after 48 hours.  You will likely have Dermabond (topical glue) over your incisions.  This seals the incisions and allows you to bathe and shower at any time after your surgery.  Glue should remain in place for up to 10 days.  It may be removed after 10 days by pealing off the Dermabond material or using Vaseline or naval jelly to remove.  ACTIVITIES:  You may resume regular (light) daily activities beginning the next day - such as daily self-care, walking, climbing stairs - gradually increasing activities as tolerated.  You  may have sexual intercourse when it is comfortable.  Refrain from any heavy lifting or straining until approved by your doctor.  You may drive when you are no longer taking prescription pain medication, when you can comfortably wear a seatbelt, and when you can safely maneuver your car and apply the brakes.  You should see your doctor in the office for a follow-up appointment approximately 2-3 weeks after your surgery.  Make sure that you call for this appointment within a day or two after you arrive home to insure a convenient appointment time.  WHEN TO CALL YOUR DOCTOR: Fever greater than 101.0 Inability to urinate Persistent nausea and/or vomiting Extreme swelling or bruising Continued bleeding from incision Increased pain, redness, or drainage from the incision  The clinic staff is available to answer your questions during regular business hours.  Please don't hesitate to call and ask to speak to one of the nurses for clinical concerns.  If you have a medical emergency, go to the nearest emergency room or call 911.  A surgeon from [facility] is always on call for the hospital.   [facility] [address], [address], [place], [place]  [address]  [phone] ? [phone] ? FAX [phone] 

## 2022-02-07 ENCOUNTER — Encounter (HOSPITAL_COMMUNITY): Payer: Self-pay | Admitting: Surgery

## 2022-02-07 ENCOUNTER — Other Ambulatory Visit: Payer: Self-pay

## 2022-03-04 ENCOUNTER — Other Ambulatory Visit: Payer: Self-pay | Admitting: Interventional Cardiology

## 2022-03-17 DIAGNOSIS — E119 Type 2 diabetes mellitus without complications: Secondary | ICD-10-CM | POA: Diagnosis not present

## 2022-03-17 DIAGNOSIS — M1A0710 Idiopathic chronic gout, right ankle and foot, without tophus (tophi): Secondary | ICD-10-CM | POA: Diagnosis not present

## 2022-04-01 DIAGNOSIS — C441191 Basal cell carcinoma of skin of left upper eyelid, including canthus: Secondary | ICD-10-CM | POA: Diagnosis not present

## 2022-04-01 DIAGNOSIS — L821 Other seborrheic keratosis: Secondary | ICD-10-CM | POA: Diagnosis not present

## 2022-04-01 DIAGNOSIS — L570 Actinic keratosis: Secondary | ICD-10-CM | POA: Diagnosis not present

## 2022-04-01 DIAGNOSIS — L309 Dermatitis, unspecified: Secondary | ICD-10-CM | POA: Diagnosis not present

## 2022-04-01 DIAGNOSIS — L814 Other melanin hyperpigmentation: Secondary | ICD-10-CM | POA: Diagnosis not present

## 2022-04-01 DIAGNOSIS — C44319 Basal cell carcinoma of skin of other parts of face: Secondary | ICD-10-CM | POA: Diagnosis not present

## 2022-04-01 DIAGNOSIS — Z85828 Personal history of other malignant neoplasm of skin: Secondary | ICD-10-CM | POA: Diagnosis not present

## 2022-05-06 DIAGNOSIS — C44219 Basal cell carcinoma of skin of left ear and external auricular canal: Secondary | ICD-10-CM | POA: Diagnosis not present

## 2022-05-06 DIAGNOSIS — C441191 Basal cell carcinoma of skin of left upper eyelid, including canthus: Secondary | ICD-10-CM | POA: Diagnosis not present

## 2022-07-15 DIAGNOSIS — Z0000 Encounter for general adult medical examination without abnormal findings: Secondary | ICD-10-CM | POA: Diagnosis not present

## 2022-07-15 DIAGNOSIS — M1A0710 Idiopathic chronic gout, right ankle and foot, without tophus (tophi): Secondary | ICD-10-CM | POA: Diagnosis not present

## 2022-11-29 ENCOUNTER — Encounter: Payer: Self-pay | Admitting: Cardiovascular Disease

## 2022-11-29 NOTE — Progress Notes (Signed)
Cardiology Office Note:    Date:  12/02/2022   ID:  Adrian [name], DOB February 01, 1946, MRN [id number]  PCP:  Adrian [name], Adrian [name]   [facility] Providers Cardiologist:  Adrian Miss, MD , previous Adrian [name] , now Adrian [name]  Click to update primary MD,subspecialty MD or APP then REFRESH:1}    Referring MD: Adrian [name], Adrian [name]   Chief Complaint  Patient presents with   Coronary Artery Disease          History of Present Illness:    Adrian [name] is a 77 y.o. male with a hx of CAD , DM, HLD  I am seeing him for the first time today after the retirement of Dr. Katrinka [name]  No CP , no dyspnea Limited activity  Severe spinal stenosis  Lots of arthritis  Has had 1 knee replacement ( at the [facility] in [place])   Labs from [facility] Sept. 2023 Chol = 128 Trigs = 236 LDL = 43 HDL = 38    Past Medical History:  Diagnosis Date   Arthritis    back and knees   Back pain due to injury    broken back in 1960s.   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    Past Surgical History:  Procedure Laterality Date   CORONARY ARTERY BYPASS GRAFT N/A 08/26/2013   Procedure: Coronary Artery Bypass Graft times four using left internal mammary artery and right leg greater saphenous vein harvested endoscopically;  Surgeon: Loreli Slot, MD;  Location: [facility] OR;  Service: Open Heart Surgery;  Laterality: N/A;   INGUINAL HERNIA REPAIR Right 02/06/2022   Procedure: OPEN REPAIR RIGHT INGUINAL HERNIA WITH MESH;  Surgeon: Darnell Level, MD;  Location: [facility] ORS;  Service: General;  Laterality: Right;   INTRAOPERATIVE TRANSESOPHAGEAL ECHOCARDIOGRAM N/A 08/26/2013   Procedure: INTRAOPERATIVE  TRANSESOPHAGEAL ECHOCARDIOGRAM;  Surgeon: Loreli Slot, MD;  Location: [facility];  Service: Open Heart Surgery;  Laterality: N/A;   KNEE SURGERY Left 2015   total   KNEE SURGERY Right 1964   left 1968   LEFT HEART CATHETERIZATION WITH CORONARY ANGIOGRAM N/A 08/24/2013   Procedure: LEFT HEART CATHETERIZATION WITH CORONARY ANGIOGRAM;  Surgeon: Lesleigh Noe, MD;  [facility];  Service: Cardiovascular;  Laterality: N/A;   TRANSURETHRAL RESECTION OF PROSTATE     age 45    Current Medications: Current Meds  Medication Sig   allopurinol (ZYLOPRIM) 300 MG tablet Take 300 mg by mouth daily.   amoxicillin (AMOXIL) 500 MG capsule Take 4 capsules by mouth as needed. (dental procedures)   aspirin EC 81 MG tablet Take 1 tablet (81 mg total) by mouth daily.   cholecalciferol (VITAMIN D3) 25 MCG (1000 UNIT) tablet Take 1,000 Units by mouth daily.   empagliflozin (JARDIANCE) 25 MG TABS tablet Take 12.5 mg by mouth daily.   gabapentin (NEURONTIN) 300 MG capsule Take 300 mg by mouth 4 (four) times daily.    HYDROmorphone (DILAUDID) 2 MG tablet Take 2 mg by mouth 5 (five) times daily as needed for moderate pain.    metFORMIN (GLUCOPHAGE-XR) 500 MG 24  hr tablet Take 500-1,000 mg by mouth See admin instructions. 1000 mg in the morning, 500 mg in th evening   simvastatin (ZOCOR) 20 MG tablet TAKE 1 TABLET BY MOUTH DAILY AT 6 PM.     Allergies:   Flagyl [metronidazole], Ibuprofen, Iodine, Codeine, and Hydrocodone   Social History   Socioeconomic History   Marital status: Married    Spouse name: Not on file   Number of children: Not on file   Years of education: Not on file   Highest education level: Not on file  Occupational History   Not on file  Tobacco Use   Smoking status: Former    Packs/day: 0.50    Years: 40.00    Additional pack years: 0.00    Total pack years: 20.00    Types: Cigarettes    Quit date: 08/23/2013    Years since quitting: 9.2   Smokeless tobacco: Never   Vaping Use   Vaping Use: Never used  Substance and Sexual Activity   Alcohol use: Yes    Alcohol/week: 7.0 standard drinks of alcohol    Types: 7 Standard drinks or equivalent per week    Comment: occ   Drug use: No   Sexual activity: Not on file  Other Topics Concern   Not on file  Social History Narrative   Not on file   Social Determinants of Health   Financial Resource Strain: Not on file  Food Insecurity: Not on file  Transportation Needs: Not on file  Physical Activity: Not on file  Stress: Not on file  Social Connections: Not on file     Family History: The patient's family history includes Heart attack in his father; Lung disease in his mother.  ROS:   Please see the history of present illness.     All other systems reviewed and are negative.  EKGs/Labs/Other Studies Reviewed:    The following studies were reviewed today:   EKG: Dec 02, 2022: Sinus bradycardia 59.  No ST or T wave changes.  Normal EKG.  Recent Labs: 01/29/2022: BUN 15; Creatinine, Ser 1.12; Hemoglobin 16.6; Platelets 152; Potassium 4.4; Sodium 141  Recent Lipid Panel    Component Value Date/Time   CHOL 121 10/04/2019 1632   TRIG 182 (H) 10/04/2019 1632   HDL 26 (L) 10/04/2019 1632   CHOLHDL 4.7 10/04/2019 1632   CHOLHDL 5 08/24/2014 0914   VLDL 59.8 (H) 08/24/2014 0914   LDLCALC 64 10/04/2019 1632   LDLDIRECT 74.0 08/24/2014 0914     Risk Assessment/Calculations:                Physical Exam:    VS:  BP 124/66 (BP Location: Left Arm, Patient Position: Sitting, Cuff Size: Large)   Pulse (!) 59   Ht 5\' 11"  (1.803 m)   Wt 198 lb 12.8 oz (90.2 kg)   SpO2 94%   BMI 27.73 kg/m     Wt Readings from Last 3 Encounters:  12/02/22 198 lb 12.8 oz (90.2 kg)  01/29/22 194 lb (88 kg)  11/25/21 194 lb 6.4 oz (88.2 kg)     GEN:  Well nourished, well developed in no acute distress HEENT: Normal NECK: No JVD; No carotid bruits LYMPHATICS: No lymphadenopathy CARDIAC: RRR, no  murmurs, rubs, gallops RESPIRATORY:  Clear to auscultation without rales, wheezing or rhonchi  ABDOMEN: Soft, non-tender, non-distended MUSCULOSKELETAL:  No edema; No deformity  SKIN: Warm and dry NEUROLOGIC:  Alert and oriented x 3 PSYCHIATRIC:  Normal affect  ASSESSMENT:    1. Coronary artery disease involving native coronary artery of native heart without angina pectoris   2. Other hyperlipidemia    PLAN:    In order of problems listed above:  Coronary artery disease: He is not having episodes of angina.  He is not able to exercise much because of his spinal stenosis and other arthritic issues.  2.  Hyperlipidemia.  Continue current medications.             Medication Adjustments/Labs and Tests Ordered: Current medicines are reviewed at length with the patient today.  Concerns regarding medicines are outlined above.  Orders Placed This Encounter  Procedures   Lipid panel   ALT   Basic metabolic panel   EKG 12-Lead   No orders of the defined types were placed in this encounter.   Patient Instructions  Medication Instructions:  Your physician recommends that you continue on your current medications as directed. Please refer to the Current Medication list given to you today.  *If you need a refill on your cardiac medications before your next appointment, please call your pharmacy*   Lab Work: Lipids, ALT, BMET today If you have labs (blood work) drawn today and your tests are completely normal, you will receive your results only by: MyChart Message (if you have MyChart) OR A paper copy in the mail If you have any lab test that is abnormal or we need to change your treatment, we will call you to review the results.   Testing/Procedures: NONE   Follow-Up: At [facility], you and your health needs are our priority.  As part of our continuing mission to provide you with exceptional heart care, we have created designated Provider Care Teams.  These  Care Teams include your primary Cardiologist (physician) and Advanced Practice Providers (APPs -  Physician Assistants and Nurse Practitioners) who all work together to provide you with the care you need, when you need it.  We recommend signing up for the patient portal called "MyChart".  Sign up information is provided on this After Visit Summary.  MyChart is used to connect with patients for Virtual Visits (Telemedicine).  Patients are able to view lab/test results, encounter notes, upcoming appointments, etc.  Non-urgent messages can be sent to your provider as well.   To learn more about what you can do with MyChart, go to [email or web address].    Your next appointment:   1 year(s)  Provider:   Kristeen Miss, MD     Signed, Adrian Miss, MD  12/02/2022 11:12 AM    [facility]

## 2022-12-01 ENCOUNTER — Other Ambulatory Visit: Payer: Self-pay

## 2022-12-01 MED ORDER — SIMVASTATIN 20 MG PO TABS: ORAL_TABLET | ORAL | 0 refills | Status: DC

## 2022-12-02 ENCOUNTER — Encounter: Payer: Self-pay | Admitting: Cardiovascular Disease

## 2022-12-02 ENCOUNTER — Ambulatory Visit: Payer: Medicare Other | Attending: Cardiovascular Disease | Admitting: Cardiovascular Disease

## 2022-12-02 VITALS — BP 124/66 | HR 59 | Ht 71.0 in | Wt 198.8 lb

## 2022-12-02 DIAGNOSIS — E7849 Other hyperlipidemia: Secondary | ICD-10-CM

## 2022-12-02 DIAGNOSIS — I2510 Atherosclerotic heart disease of native coronary artery without angina pectoris: Secondary | ICD-10-CM

## 2022-12-02 LAB — LIPID PANEL
Chol/HDL Ratio: 3.9 ratio (ref 0.0–5.0)
Cholesterol, Total: 128 mg/dL (ref 100–199)
HDL: 33 mg/dL — ABNORMAL LOW (ref >39)
LDL Chol Calc (NIH): 56 mg/dL (ref 0–99)
Triglycerides: 242 mg/dL — ABNORMAL HIGH (ref 0–149)
VLDL Cholesterol Cal: 39 mg/dL (ref 5–40)

## 2022-12-02 LAB — BASIC METABOLIC PANEL
BUN/Creatinine Ratio: 11 (ref 10–24)
BUN: 12 mg/dL (ref 8–27)
CO2: 22 mmol/L (ref 20–29)
Calcium: 9.9 mg/dL (ref 8.6–10.2)
Chloride: 104 mmol/L (ref 96–106)
Creatinine, Ser: 1.09 mg/dL (ref 0.76–1.27)
Glucose: 106 mg/dL — ABNORMAL HIGH (ref 70–99)
Potassium: 4.2 mmol/L (ref 3.5–5.2)
Sodium: 140 mmol/L (ref 134–144)
eGFR: 71 mL/min/{1.73_m2} (ref >59)

## 2022-12-02 LAB — ALT: ALT: 33 IU/L (ref 0–44)

## 2022-12-02 NOTE — Patient Instructions (Signed)
Medication Instructions:  Your physician recommends that you continue on your current medications as directed. Please refer to the Current Medication list given to you today.  *If you need a refill on your cardiac medications before your next appointment, please call your pharmacy*   Lab Work: Lipids, ALT, BMET today If you have labs (blood work) drawn today and your tests are completely normal, you will receive your results only by: MyChart Message (if you have MyChart) OR A paper copy in the mail If you have any lab test that is abnormal or we need to change your treatment, we will call you to review the results.   Testing/Procedures: NONE   Follow-Up: At [facility], you and your health needs are our priority.  As part of our continuing mission to provide you with exceptional heart care, we have created designated Provider Care Teams.  These Care Teams include your primary Cardiologist (physician) and Advanced Practice Providers (APPs -  Physician Assistants and Nurse Practitioners) who all work together to provide you with the care you need, when you need it.  We recommend signing up for the patient portal called "MyChart".  Sign up information is provided on this After Visit Summary.  MyChart is used to connect with patients for Virtual Visits (Telemedicine).  Patients are able to view lab/test results, encounter notes, upcoming appointments, etc.  Non-urgent messages can be sent to your provider as well.   To learn more about what you can do with MyChart, go to [email or web address].    Your next appointment:   1 year(s)  Provider:   Philip Nahser, MD      

## 2023-02-28 ENCOUNTER — Other Ambulatory Visit: Payer: Self-pay | Admitting: Cardiovascular Disease

## 2023-12-06 ENCOUNTER — Encounter: Payer: Self-pay | Admitting: Cardiovascular Disease

## 2023-12-06 NOTE — Progress Notes (Unsigned)
 Cardiology Office Note:    Date:  12/07/2023   ID:  Adrian [name], DOB 01-01-1946, MRN [id number]  PCP:  Sulema Endo, NP   [facility] Providers Cardiologist:  Ahmad Alert, MD , previous Adrian [name] , now Kaytlynn Kochan  Click to update primary MD,subspecialty MD or APP then REFRESH:1}    Referring MD: Sulema Endo, NP   Chief Complaint  Patient presents with   Coronary Artery Disease          History of Present Illness:    Adrian [name] is a 78 y.o. male with a hx of CAD , DM, HLD  I am seeing him for the first time today after the retirement of Dr. Felipe [name]  No CP , no dyspnea Limited activity  Severe spinal stenosis  Lots of arthritis  Has had 1 knee replacement ( at the [place] in [place])   Labs from [facility] Sept. 2023 Chol = 128 Trigs = 236 LDL = 43 HDL = 38    Dec 07, 2023 Adrian [name] is seen for follow up of his CAD , HLD     Past Medical History:  Diagnosis Date   Arthritis    back and knees   Back pain due to injury    broken back in 1960s.   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     Past Surgical History:  Procedure Laterality Date   CORONARY ARTERY BYPASS GRAFT N/A 08/26/2013   Procedure: Coronary Artery Bypass Graft times four using left internal mammary artery and right leg greater saphenous vein harvested endoscopically;  Surgeon: Zelphia Higashi, MD;  Location: [facility] OR;  Service: Open Heart Surgery;  Laterality: N/A;   INGUINAL HERNIA REPAIR Right 02/06/2022   Procedure: OPEN REPAIR RIGHT INGUINAL HERNIA WITH MESH;  Surgeon: Oralee Billow, MD;  Location: [facility] ORS;  Service: General;  Laterality: Right;   INTRAOPERATIVE  TRANSESOPHAGEAL ECHOCARDIOGRAM N/A 08/26/2013   Procedure: INTRAOPERATIVE TRANSESOPHAGEAL ECHOCARDIOGRAM;  Surgeon: Zelphia Higashi, MD;  Location: [facility] OR;  Service: Open Heart Surgery;  Laterality: N/A;   KNEE SURGERY Left 2015   total   KNEE SURGERY Right 1964   left 1968   LEFT HEART CATHETERIZATION WITH CORONARY ANGIOGRAM N/A 08/24/2013   Procedure: LEFT HEART CATHETERIZATION WITH CORONARY ANGIOGRAM;  Surgeon: Mickiel Albany, MD;  Location: [facility] CATH LAB;  Service: Cardiovascular;  Laterality: N/A;   TRANSURETHRAL RESECTION OF PROSTATE     age 28    Current Medications: Current Meds  Medication Sig   acetaminophen  (TYLENOL ) 500 MG tablet Take 1,000 mg by mouth.   allopurinol (ZYLOPRIM) 300 MG tablet Take 300 mg by mouth daily.   amoxicillin  (AMOXIL ) 500 MG capsule Take 4 capsules by mouth as needed. (dental procedures)   aspirin  EC 81 MG tablet Take 1 tablet (81 mg total) by mouth daily.   cholecalciferol (VITAMIN D3) 25 MCG (1000 UNIT) tablet Take 1,000 Units by mouth daily.   diclofenac (VOLTAREN) 75 MG EC tablet Take 75 mg by mouth.   empagliflozin (JARDIANCE) 25 MG TABS tablet Take 12.5 mg by mouth daily.   gabapentin  (  NEURONTIN ) 300 MG capsule Take 300 mg by mouth 4 (four) times daily.    HYDROmorphone  (DILAUDID ) 2 MG tablet Take 2 mg by mouth 5 (five) times daily as needed for moderate pain.    metFORMIN (GLUCOPHAGE-XR) 500 MG 24 hr tablet Take 500-1,000 mg by mouth See admin instructions. 1000 mg in the morning, 500 mg in th evening   simvastatin  (ZOCOR ) 20 MG tablet TAKE 1 TABLET BY MOUTH DAILY AT 6 PM.     Allergies:   Flagyl  [metronidazole ], Ibuprofen, Iodine, Codeine, and Hydrocodone    Social History   Socioeconomic History   Marital status: Married    Spouse name: Not on file   Number of children: Not on file   Years of education: Not on file   Highest education level: Not on file  Occupational History   Not on file  Tobacco Use   Smoking status: Former     Current packs/day: 0.00    Average packs/day: 0.5 packs/day for 40.0 years (20.0 ttl pk-yrs)    Types: Cigarettes    Start date: 08/23/1973    Quit date: 08/23/2013    Years since quitting: 10.2   Smokeless tobacco: Never  Vaping Use   Vaping status: Never Used  Substance and Sexual Activity   Alcohol use: Yes    Alcohol/week: 7.0 standard drinks of alcohol    Types: 7 Standard drinks or equivalent per week    Comment: occ   Drug use: No   Sexual activity: Not on file  Other Topics Concern   Not on file  Social History Narrative   Not on file   Social Drivers of Health   Financial Resource Strain: Low Risk  (11/20/2023)   Received from [facility]   Overall Financial Resource Strain (CARDIA)    Difficulty of Paying Living Expenses: Not hard at all  Food Insecurity: No Food Insecurity (11/20/2023)   Received from [facility]   Hunger Vital Sign    Worried About Running Out of Food in the Last Year: Never true    Ran Out of Food in the Last Year: Never true  Transportation Needs: No Transportation Needs (11/20/2023)   Received from [facility] - Transportation    Lack of Transportation (Medical): No    Lack of Transportation (Non-Medical): No  Physical Activity: Unknown (05/19/2023)   Received from [facility]   Exercise Vital Sign    Days of Exercise per Week: 0 days    Minutes of Exercise per Session: Not on file  Stress: No Stress Concern Present (05/19/2023)   Received from [facility] - Occupational Stress Questionnaire    Feeling of Stress : Not at all  Social Connections: Moderately Integrated (05/19/2023)   Received from [facility]   Social Network    How would you rate your social network (family, work, friends)?: Adequate participation with social networks     Family History: The patient's family history includes Heart attack in his father; Lung disease in his mother.  ROS:   Please see  the history of present illness.     All other systems reviewed and are negative.  EKGs/Labs/Other Studies Reviewed:    The following studies were reviewed today:   EKG:    EKG Interpretation Date/Time:  Monday Dec 07 2023 14:39:30 EDT Ventricular Rate:  64 PR Interval:  174 QRS Duration:  78 QT Interval:  402 QTC Calculation: 414 R Axis:   10  Text Interpretation: Normal sinus rhythm Normal ECG When compared with ECG of 08-Dec-2015 14:31, Premature ventricular complexes are no longer Present Confirmed by Ahmad Alert ([id number]) on 12/07/2023 2:58:11 PM    Recent Labs: No results found for requested labs within last 365 days.  Recent Lipid Panel    Component Value Date/Time   CHOL 128 12/02/2022 1112   TRIG 242 (H) 12/02/2022 1112   HDL 33 (L) 12/02/2022 1112   CHOLHDL 3.9 12/02/2022 1112   CHOLHDL 5 08/24/2014 0914   VLDL 59.8 (H) 08/24/2014 0914   LDLCALC 56 12/02/2022 1112   LDLDIRECT 74.0 08/24/2014 0914     Risk Assessment/Calculations:             Physical Exam:    Physical Exam: Blood pressure 124/72, pulse 64, height 5\' 11"  (1.803 m), weight 189 lb 9.6 oz (86 kg), SpO2 95%.       GEN:  Well nourished, well developed in no acute distress HEENT: Normal NECK: No JVD; No carotid bruits LYMPHATICS: No lymphadenopathy CARDIAC: RRR , no murmurs, rubs, gallops RESPIRATORY:  Clear to auscultation without rales, wheezing or rhonchi  ABDOMEN: Soft, non-tender, non-distended MUSCULOSKELETAL:  No edema; No deformity  SKIN: Warm and dry NEUROLOGIC:  Alert and oriented x 3   ASSESSMENT:    1. Coronary artery disease involving native coronary artery of native heart without angina pectoris   2. Other hyperlipidemia     PLAN:       Coronary artery disease:   Doing well .  No angina   Ldl is at goal   2.  Hyperlipidemia.    Continue simvastatin  . Ldl is 56             Medication Adjustments/Labs and Tests Ordered: Current medicines are  reviewed at length with the patient today.  Concerns regarding medicines are outlined above.  Orders Placed This Encounter  Procedures   Lipid panel   ALT   Basic metabolic panel with GFR   EKG 95-AOZH   No orders of the defined types were placed in this encounter.   Patient Instructions  Lab Work: Lipids, ALT, BMET today If you have labs (blood work) drawn today and your tests are completely normal, you will receive your results only by: MyChart Message (if you have MyChart) OR A paper copy in the mail If you have any lab test that is abnormal or we need to change your treatment, we will call you to review the results.  Follow-Up: At [facility], you and your health needs are our priority.  As part of our continuing mission to provide you with exceptional heart care, our providers are all part of one team.  This team includes your primary Cardiologist (physician) and Advanced Practice Providers or APPs (Physician Assistants and Nurse Practitioners) who all work together to provide you with the care you need, when you need it.  Your next appointment:   1 year(s)  Provider:   Ahmad Alert, MD          Signed, Ahmad Alert, MD  12/07/2023 8:27 PM    [facility]

## 2023-12-07 ENCOUNTER — Ambulatory Visit: Attending: Cardiovascular Disease | Admitting: Cardiovascular Disease

## 2023-12-07 ENCOUNTER — Encounter: Payer: Self-pay | Admitting: Cardiovascular Disease

## 2023-12-07 VITALS — BP 124/72 | HR 64 | Ht 71.0 in | Wt 189.6 lb

## 2023-12-07 DIAGNOSIS — I2510 Atherosclerotic heart disease of native coronary artery without angina pectoris: Secondary | ICD-10-CM | POA: Diagnosis not present

## 2023-12-07 DIAGNOSIS — E7849 Other hyperlipidemia: Secondary | ICD-10-CM

## 2023-12-07 NOTE — Patient Instructions (Signed)
  Lab Work: Lipids, ALT, BMET today If you have labs (blood work) drawn today and your tests are completely normal, you will receive your results only by: MyChart Message (if you have MyChart) OR A paper copy in the mail If you have any lab test that is abnormal or we need to change your treatment, we will call you to review the results.  Follow-Up: At [facility], you and your health needs are our priority.  As part of our continuing mission to provide you with exceptional heart care, our providers are all part of one team.  This team includes your primary Cardiologist (physician) and Advanced Practice Providers or APPs (Physician Assistants and Nurse Practitioners) who all work together to provide you with the care you need, when you need it.  Your next appointment:   1 year(s)  Provider:   Ahmad Alert, MD

## 2023-12-09 ENCOUNTER — Ambulatory Visit: Payer: Self-pay | Admitting: Cardiovascular Disease

## 2023-12-09 LAB — BASIC METABOLIC PANEL WITH GFR
BUN/Creatinine Ratio: 13 (ref 10–24)
BUN: 15 mg/dL (ref 8–27)
CO2: 18 mmol/L — ABNORMAL LOW (ref 20–29)
Calcium: 9.9 mg/dL (ref 8.6–10.2)
Chloride: 103 mmol/L (ref 96–106)
Creatinine, Ser: 1.2 mg/dL (ref 0.76–1.27)
Glucose: 91 mg/dL (ref 70–99)
Potassium: 5 mmol/L (ref 3.5–5.2)
Sodium: 143 mmol/L (ref 134–144)
eGFR: 63 mL/min/{1.73_m2} (ref >59)

## 2023-12-09 LAB — LIPID PANEL
Chol/HDL Ratio: 4.3 ratio (ref 0.0–5.0)
Cholesterol, Total: 134 mg/dL (ref 100–199)
HDL: 31 mg/dL — ABNORMAL LOW (ref >39)
LDL Chol Calc (NIH): 59 mg/dL (ref 0–99)
Triglycerides: 281 mg/dL — ABNORMAL HIGH (ref 0–149)
VLDL Cholesterol Cal: 44 mg/dL — ABNORMAL HIGH (ref 5–40)

## 2023-12-09 LAB — ALT: ALT: 27 IU/L (ref 0–44)

## 2023-12-28 ENCOUNTER — Telehealth: Payer: Self-pay | Admitting: Cardiovascular Disease

## 2023-12-28 MED ORDER — NITROGLYCERIN 0.4 MG SL SUBL: SUBLINGUAL_TABLET | SUBLINGUAL | 3 refills | Status: AC

## 2023-12-28 NOTE — Telephone Encounter (Signed)
   Pt c/o of Chest Pain: STAT if active CP, including tightness, pressure, jaw pain, radiating pain to shoulder/upper arm/back, CP unrelieved by Nitro. Symptoms reported of SOB, nausea, vomiting, sweating.  1. Are you having CP right now? On Saturday patient said he had terrible pain radiating fin his chest up to his and down through his left arm- no symptoms this mornings    2. Are you experiencing any other symptoms (ex. SOB, nausea, vomiting, sweating)?  No symptoms when this was happening   3. Is your CP continuous or coming and going? He said this lasted for about 5 seconds   4. Have you taken Nitroglycerin ? No, did not have any   5. How long have you been experiencing CP?  Saturday for 5 seconds and last night for about 1 second- he said both times this happen, he was at rest   6. If NO CP at time of call then end call with telling Pt to call back or call 911 if Chest pain returns prior to return call from triage team.

## 2023-12-28 NOTE — Telephone Encounter (Signed)
 Adrian [name], Adrian Purple, MD to [name] Triage  Me   12/28/23 12:55 PM Hi symptoms sound very atypical for angina  He has a hx of CAD   If his symptoms start lasting longer that 1-2 seconds  or worsen he should proceed to the ER  PN  Called patient back with Dr. [name] advisement. Made patient an appointment with [name] NP in a week to discuss any on going symptoms. Sent in nitroglycerin  for patient to have on him, just in case.

## 2023-12-28 NOTE — Telephone Encounter (Signed)
 Called patient back about message. Patient stated that he had a chest pain that shot up to his left jaw and then went down the inner left arm. Patient stated this was at rest and lasted for about 5 seconds on Saturday and then another one last night that lasted for 1 second. Patient stated it felt like an electric shock that went through him. Patient stated he had no symptoms before his heart attack in 2015 that ended with a CABG x 4. Will consult Dr. Alroy Aspen who just saw patient last month.

## 2024-01-04 NOTE — Progress Notes (Signed)
 Cardiology Office Note    Patient Name: Adrian Adrian [name] Date of Encounter: 01/04/2024  Primary Care Provider:  Sulema Endo, NP Primary Cardiologist:  Ahmad Alert, MD Primary Electrophysiologist: None   Past Medical History    Past Medical History:  Diagnosis Date   Arthritis    back and knees   Back pain due to injury    broken back in 1960s.   CAD, multiple vessel    Diabetes mellitus without complication (HCC)    Diverticulitis    Gout    Hyperlipidemia    Neuromuscular disorder (HCC)    neuropathy of feet   NSTEMI (non-ST elevated myocardial infarction) (HCC) \   Plantar fasciitis    Pneumonia 06/2021   due  to aspiration of an oral medication   Tremor of Adrian [name] hands    due to Gabapentin     History of Present Illness  Adrian Adrian [name] is a 78 y.o. male with a PMH of CAD s/p NSTEMI 2015 with CABG x 4, HLD, statin, HTN, carotid artery disease (bilateral 1-39% stenosis), PVCs who presents today for complaint of chest pain.  Adrian Adrian [name] has been followed since 2015 when he presented to the ED with complaint of chest pain and found to have elevated troponins and diagnosed for NSTEMI.  He completed a LHC showing multivessel disease underwent CABG x 4 by Dr. Luna Adrian [name].  He was previously followed by Dr. Felipe Adrian [name] and Adrian Adrian [name] for management of coronary artery disease.  He completed an ischemic evaluation in 2016 by [facility] that was normal.  Pleated carotid duplex in 2018 showing bilateral 1-39% stenosis were an event monitor in 2018 palpitations that showed sinus rhythm with PACs and PVCs.  He was seen in 2020 for Dr. Alroy [name] to establish care and was last seen on 12/07/2023 of this visit showing no evidence of angina and cholesterol at goal.  He contacted our office on 12/28/2023 with complaint of chest pain that shot up to his left jaw and down to his arm.  He was provided ED precautions and sent a prescription for nitroglycerin .  Patient denies chest pain, palpitations,  dyspnea, PND, orthopnea, nausea, vomiting, dizziness, syncope, edema, weight gain, or early satiety.   Discussed the use of AI scribe software for clinical note transcription with the patient, who gave verbal consent to proceed.  History of Present Illness    ***Notes:   Review of Systems  Please see the history of present illness.    All other systems reviewed and are otherwise negative except as noted above.  Physical Exam     Wt Readings from Last 3 Encounters:  12/07/23 189 lb 9.6 oz (86 kg)  12/02/22 198 lb 12.8 oz (90.2 kg)  01/29/22 194 lb (88 kg)   ZO:XWRUE were no vitals filed for this visit.,There is no height or weight on file to calculate BMI. GEN: Well nourished, well developed in no acute distress Neck: No JVD; No carotid bruits Pulmonary: Clear to auscultation without rales, wheezing or rhonchi  Cardiovascular: Normal rate. Regular rhythm. Normal S1. Normal S2.   Murmurs: There is no murmur.  ABDOMEN: Soft, non-tender, non-distended EXTREMITIES:  No edema; No deformity   EKG/LABS/ Recent Cardiac Studies   ECG personally reviewed by me today - ***  Risk Assessment/Calculations:   {Does this patient have ATRIAL FIBRILLATION?:[id number]}      Lab Results  Component Value Date   WBC 10.6 (H) 01/29/2022   HGB 16.6 01/29/2022   HCT 49.0 01/29/2022   MCV 89.3  01/29/2022   PLT 152 01/29/2022   Lab Results  Component Value Date   CREATININE 1.20 12/07/2023   BUN 15 12/07/2023   NA 143 12/07/2023   K 5.0 12/07/2023   CL 103 12/07/2023   CO2 18 (L) 12/07/2023   Lab Results  Component Value Date   CHOL 134 12/07/2023   HDL 31 (L) 12/07/2023   LDLCALC 59 12/07/2023   LDLDIRECT 74.0 08/24/2014   TRIG 281 (H) 12/07/2023   CHOLHDL 4.3 12/07/2023    Lab Results  Component Value Date   HGBA1C 5.6 01/29/2022   Assessment & Plan    Assessment and Plan Assessment & Plan     1.  CAD  2.  Essential hypertension  3.  Hyperlipidemia  4.   Carotid artery disease      Disposition: Follow-up with Ahmad Alert, MD or APP in *** months {Are you ordering a CV Procedure (e.g. stress test, cath, DCCV, TEE, etc)?   Press F2        :[id number]}   Signed, Francene Ing, Retha Cast, NP 01/04/2024, 6:56 PM [facility]

## 2024-01-05 ENCOUNTER — Ambulatory Visit: Attending: Nurse Practitioner | Admitting: Nurse Practitioner

## 2024-01-05 ENCOUNTER — Encounter: Payer: Self-pay | Admitting: Nurse Practitioner

## 2024-01-05 VITALS — BP 118/50 | HR 86 | Ht 71.0 in | Wt 197.0 lb

## 2024-01-05 DIAGNOSIS — I2510 Atherosclerotic heart disease of native coronary artery without angina pectoris: Secondary | ICD-10-CM

## 2024-01-05 DIAGNOSIS — E785 Hyperlipidemia, unspecified: Secondary | ICD-10-CM

## 2024-01-05 DIAGNOSIS — I10 Essential (primary) hypertension: Secondary | ICD-10-CM

## 2024-01-05 DIAGNOSIS — I6529 Occlusion and stenosis of unspecified carotid artery: Secondary | ICD-10-CM | POA: Diagnosis not present

## 2024-01-05 NOTE — Patient Instructions (Addendum)
 Medication Instructions:  Your physician recommends that you continue on your current medications as directed. Please refer to the Current Medication list given to you today. *If you need a refill on your cardiac medications before your next appointment, please call your pharmacy*  Lab Work: STAT TROPONIN TODAY If you have labs (blood work) drawn today and your tests are completely normal, you will receive your results only by: MyChart Message (if you have MyChart) OR A paper copy in the mail If you have any lab test that is abnormal or we need to change your treatment, we will call you to review the results.  Testing/Procedures: CARDIAC PET STRESS TEST   Your physician has requested that you have a carotid duplex. This test is an ultrasound of the carotid arteries in your neck. It looks at blood flow through these arteries that supply the brain with blood. Allow one hour for this exam. There are no restrictions or special instructions.  Your physician has requested that you have an echocardiogram. Echocardiography is a painless test that uses sound waves to create images of your heart. It provides your doctor with information about the size and shape of your heart and how well your heart's chambers and valves are working. This procedure takes approximately one hour. There are no restrictions for this procedure. Please do NOT wear cologne, perfume, aftershave, or lotions (deodorant is allowed). Please arrive 15 minutes prior to your appointment time.  Please note: We ask at that you not bring children with you during ultrasound (echo/ vascular) testing. Due to room size and safety concerns, children are not allowed in the ultrasound rooms during exams. Our front office staff cannot provide observation of children in our lobby area while testing is being conducted. An adult accompanying a patient to their appointment will only be allowed in the ultrasound room at the discretion of the ultrasound  technician under special circumstances. We apologize for any inconvenience.   Follow-Up: At [facility], you and your health needs are our priority.  As part of our continuing mission to provide you with exceptional heart care, our providers are all part of one team.  This team includes your primary Cardiologist (physician) and Advanced Practice Providers or APPs (Physician Assistants and Nurse Practitioners) who all work together to provide you with the care you need, when you need it.  Your next appointment:   6 month(s)  Provider:   Llewellyn Riles, MD  We recommend signing up for the patient portal called "MyChart".  Sign up information is provided on this After Visit Summary.  MyChart is used to connect with patients for Virtual Visits (Telemedicine).  Patients are able to view lab/test results, encounter notes, upcoming appointments, etc.  Non-urgent messages can be sent to your provider as well.   To learn more about what you can do with MyChart, go to [email or web address].   Other Instructions    Please report to Radiology at the [facility] Main Entrance 30 minutes early for your test.  [address] [place], [place] [address]                    How to Prepare for Your Cardiac PET/CT Stress Test:  Nothing to eat or drink, except water , 3 hours prior to arrival time.  NO caffeine/decaffeinated products, or chocolate 12 hours prior to arrival. (Please note decaffeinated beverages (teas/coffees) still contain caffeine).  If you have caffeine within 12 hours prior, the test will need to  be rescheduled.  Medication instructions: Do not take erectile dysfunction medications for 72 hours prior to test (sildenafil, tadalafil) Do not take nitrates (isosorbide mononitrate, Ranexa) the day before or day of test Do not take tamsulosin the day before or morning of test Hold theophylline containing medications for 12 hours. Hold Dipyridamole 48 hours prior to the  test.  Diabetic Preparation: If able to eat breakfast prior to 3 hour fasting, you may take all medications, including your insulin . Do not worry if you miss your breakfast dose of insulin  - start at your next meal. If you do not eat prior to 3 hour fast-Hold all diabetes (oral and insulin ) medications. Patients who wear a continuous glucose monitor MUST remove the device prior to scanning.  You may take your remaining medications with water .  NO perfume, cologne or lotion on chest or abdomen area. FEMALES - Please avoid wearing dresses to this appointment.  Total time is 1 to 2 hours; you may want to bring reading material for the waiting time.  IF YOU THINK YOU MAY BE PREGNANT, OR ARE NURSING PLEASE INFORM THE TECHNOLOGIST.  In preparation for your appointment, medication and supplies will be purchased.  Appointment availability is limited, so if you need to cancel or reschedule, please call the Radiology Department Scheduler at [phone] 24 hours in advance to avoid a cancellation fee of $100.00  What to Expect When you Arrive:  Once you arrive and check in for your appointment, you will be taken to a preparation room within the Radiology Department.  A technologist or Nurse will obtain your medical history, verify that you are correctly prepped for the exam, and explain the procedure.  Afterwards, an IV will be started in your arm and electrodes will be placed on your skin for EKG monitoring during the stress portion of the exam. Then you will be escorted to the PET/CT scanner.  There, staff will get you positioned on the scanner and obtain a blood pressure and EKG.  During the exam, you will continue to be connected to the EKG and blood pressure machines.  A small, safe amount of a radioactive tracer will be injected in your IV to obtain a series of pictures of your heart along with an injection of a stress agent.    After your Exam:  It is recommended that you eat a meal and drink a  caffeinated beverage to counter act any effects of the stress agent.  Drink plenty of fluids for the remainder of the day and urinate frequently for the first couple of hours after the exam.  Your doctor will inform you of your test results within 7-10 business days.  For more information and frequently asked questions, please visit our website: [email or web address]  For questions about your test or how to prepare for your test, please call: Cardiac Imaging Nurse Navigators Office: [phone]

## 2024-01-06 ENCOUNTER — Ambulatory Visit: Payer: Self-pay | Admitting: Nurse Practitioner

## 2024-01-06 DIAGNOSIS — I77810 Thoracic aortic ectasia: Secondary | ICD-10-CM

## 2024-01-06 DIAGNOSIS — E785 Hyperlipidemia, unspecified: Secondary | ICD-10-CM

## 2024-01-06 DIAGNOSIS — I2510 Atherosclerotic heart disease of native coronary artery without angina pectoris: Secondary | ICD-10-CM

## 2024-01-06 DIAGNOSIS — I10 Essential (primary) hypertension: Secondary | ICD-10-CM

## 2024-01-06 DIAGNOSIS — I6529 Occlusion and stenosis of unspecified carotid artery: Secondary | ICD-10-CM

## 2024-01-06 LAB — TROPONIN T: Troponin T (Highly Sensitive): 11 ng/L (ref 0–22)

## 2024-02-09 ENCOUNTER — Encounter (HOSPITAL_COMMUNITY): Payer: Self-pay

## 2024-02-11 ENCOUNTER — Ambulatory Visit
Admission: RE | Admit: 2024-02-11 | Discharge: 2024-02-11 | Disposition: A | Discharge status: Home / Self Care | Source: Ambulatory Visit | Attending: Nurse Practitioner | Admitting: Nurse Practitioner

## 2024-02-11 DIAGNOSIS — E785 Hyperlipidemia, unspecified: Secondary | ICD-10-CM | POA: Insufficient documentation

## 2024-02-11 DIAGNOSIS — I700 Atherosclerosis of aorta: Secondary | ICD-10-CM | POA: Insufficient documentation

## 2024-02-11 DIAGNOSIS — I10 Essential (primary) hypertension: Secondary | ICD-10-CM | POA: Diagnosis not present

## 2024-02-11 DIAGNOSIS — I2510 Atherosclerotic heart disease of native coronary artery without angina pectoris: Secondary | ICD-10-CM | POA: Diagnosis present

## 2024-02-11 DIAGNOSIS — I6529 Occlusion and stenosis of unspecified carotid artery: Secondary | ICD-10-CM | POA: Diagnosis not present

## 2024-02-11 MED ORDER — RUBIDIUM RB82 GENERATOR (RUBYFILL)
25.0000 | PACK | Freq: Once | INTRAVENOUS | Status: AC
  Administered 2024-02-11: 23.21 via INTRAVENOUS

## 2024-02-11 MED ORDER — REGADENOSON 0.4 MG/5ML IV SOLN
INTRAVENOUS | Status: AC
  Filled 2024-02-11: qty 5

## 2024-02-11 MED ORDER — REGADENOSON 0.4 MG/5ML IV SOLN
0.4000 mg | Freq: Once | INTRAVENOUS | Status: DC
  Filled 2024-02-11: qty 5

## 2024-02-11 MED ORDER — RUBIDIUM RB82 GENERATOR (RUBYFILL)
25.0000 | PACK | Freq: Once | INTRAVENOUS | Status: AC
  Administered 2024-02-11: 23.18 via INTRAVENOUS

## 2024-02-11 MED ORDER — REGADENOSON 0.4 MG/5ML IV SOLN
0.4000 mg | Freq: Once | INTRAVENOUS | Status: AC
  Administered 2024-02-11: 0.4 mg via INTRAVENOUS
  Filled 2024-02-11: qty 5

## 2024-02-12 LAB — NM PET CT CARDIAC PERFUSION MULTI W/ABSOLUTE BLOODFLOW
LV dias vol: 96 mL (ref 62–150)
LV sys vol: 36 mL (ref 4.2–5.8)
MBFR: 2.66
Nuc Rest EF: 51 %
Nuc Stress EF: 63 %
Peak HR: 67 {beats}/min
Rest HR: 55 {beats}/min
Rest MBF: 0.61 ml/g/min
Rest Nuclear Isotope Dose: 23.2 mCi
SRS: 0
SSS: 0
ST Depression (mm): 0 mm
Stress MBF: 1.62 ml/g/min
Stress Nuclear Isotope Dose: 23.2 mCi
TID: 1

## 2024-02-17 ENCOUNTER — Ambulatory Visit (HOSPITAL_BASED_OUTPATIENT_CLINIC_OR_DEPARTMENT_OTHER)
Admission: RE | Admit: 2024-02-17 | Discharge: 2024-02-17 | Disposition: A | Discharge status: Home / Self Care | Source: Ambulatory Visit | Attending: Nurse Practitioner | Admitting: Nurse Practitioner

## 2024-02-17 ENCOUNTER — Ambulatory Visit (HOSPITAL_COMMUNITY)
Admission: RE | Admit: 2024-02-17 | Discharge: 2024-02-17 | Disposition: A | Discharge status: Home / Self Care | Source: Ambulatory Visit | Attending: Nurse Practitioner | Admitting: Nurse Practitioner

## 2024-02-17 DIAGNOSIS — I6523 Occlusion and stenosis of bilateral carotid arteries: Secondary | ICD-10-CM | POA: Insufficient documentation

## 2024-02-17 DIAGNOSIS — I6529 Occlusion and stenosis of unspecified carotid artery: Secondary | ICD-10-CM

## 2024-02-17 DIAGNOSIS — E785 Hyperlipidemia, unspecified: Secondary | ICD-10-CM | POA: Diagnosis not present

## 2024-02-17 DIAGNOSIS — I2510 Atherosclerotic heart disease of native coronary artery without angina pectoris: Secondary | ICD-10-CM | POA: Diagnosis present

## 2024-02-17 DIAGNOSIS — I10 Essential (primary) hypertension: Secondary | ICD-10-CM

## 2024-02-17 LAB — ECHOCARDIOGRAM COMPLETE
AR max vel: 3.9 cm2
AV Area VTI: 3.63 cm2
AV Area mean vel: 3.78 cm2
AV Mean grad: 3 mmHg
AV Peak grad: 5.9 mmHg
Ao pk vel: 1.21 m/s
Area-P 1/2: 2.24 cm2
MV M vel: 69 m/s
MV Peak grad: 19044 mmHg
S' Lateral: 3.29 cm

## 2024-08-02 ENCOUNTER — Telehealth: Payer: Self-pay | Admitting: Cardiovascular Disease

## 2024-08-10 ENCOUNTER — Telehealth: Payer: Self-pay | Admitting: Student in an Organized Health Care Education/Training Program

## 2024-08-10 NOTE — Telephone Encounter (Signed)
 Pt is a former [employer] patient. Scheduled for Cardiac CT 1/23 and asks that Dr .Floretta be the one to look over his results since he is seeing him 5/7. Please advise.

## 2024-08-10 NOTE — Telephone Encounter (Signed)
 Called patient's wife back, DPR. Informed her that there are doctors assigned to read the test and then their are doctors assigned to result the test of the retired doctor. Encourage patient to send a mychart after his test to Dr. Floretta to review. Patient's wife verbalized understanding.

## 2024-08-19 ENCOUNTER — Ambulatory Visit
Admission: RE | Admit: 2024-08-19 | Discharge: 2024-08-19 | Disposition: A | Source: Ambulatory Visit | Attending: Nurse Practitioner | Admitting: Nurse Practitioner

## 2024-08-19 DIAGNOSIS — I10 Essential (primary) hypertension: Secondary | ICD-10-CM | POA: Diagnosis present

## 2024-08-19 DIAGNOSIS — I2510 Atherosclerotic heart disease of native coronary artery without angina pectoris: Secondary | ICD-10-CM | POA: Insufficient documentation

## 2024-08-19 DIAGNOSIS — I77810 Thoracic aortic ectasia: Secondary | ICD-10-CM | POA: Insufficient documentation

## 2024-08-19 DIAGNOSIS — E785 Hyperlipidemia, unspecified: Secondary | ICD-10-CM | POA: Insufficient documentation

## 2024-08-19 DIAGNOSIS — I6529 Occlusion and stenosis of unspecified carotid artery: Secondary | ICD-10-CM | POA: Diagnosis present

## 2024-08-19 LAB — POCT I-STAT CREATININE: Creatinine, Ser: 1.2 mg/dL (ref 0.61–1.24)

## 2024-08-19 MED ORDER — IOHEXOL 350 MG/ML SOLN
100.0000 mL | Freq: Once | INTRAVENOUS | Status: AC | PRN
  Administered 2024-08-19: 100 mL via INTRAVENOUS

## 2024-08-24 ENCOUNTER — Ambulatory Visit: Payer: Self-pay | Admitting: Student in an Organized Health Care Education/Training Program

## 2024-12-01 ENCOUNTER — Ambulatory Visit: Admitting: Student in an Organized Health Care Education/Training Program
